# Patient Record
Sex: Female | Born: 1955
Health system: Southern US, Community
[De-identification: ages and names within clinical notes are randomized; demographics above are authoritative.]

## PROBLEM LIST (undated history)

## (undated) DIAGNOSIS — I10 Essential (primary) hypertension: Secondary | ICD-10-CM

## (undated) HISTORY — PX: CARDIAC ELECTROPHYSIOLOGY MAPPING AND ABLATION: SHX1292

## (undated) HISTORY — PX: BUNIONECTOMY: SHX129

## (undated) HISTORY — PX: CARDIAC ELECTROPHYSIOLOGY STUDY AND ABLATION: SHX1294

## (undated) HISTORY — PX: VAGINAL HYSTERECTOMY: SUR661

---

## 1999-06-03 ENCOUNTER — Encounter: Payer: Self-pay | Admitting: Emergency Medicine

## 1999-06-03 ENCOUNTER — Encounter: Admission: RE | Admit: 1999-06-03 | Discharge: 1999-06-03 | Payer: Self-pay | Admitting: Emergency Medicine

## 2003-08-02 ENCOUNTER — Emergency Department (HOSPITAL_COMMUNITY): Admission: EM | Admit: 2003-08-02 | Discharge: 2003-08-02 | Payer: Self-pay | Admitting: Emergency Medicine

## 2007-03-30 ENCOUNTER — Ambulatory Visit: Payer: Self-pay | Admitting: Internal Medicine

## 2007-05-07 ENCOUNTER — Other Ambulatory Visit: Payer: Self-pay

## 2007-05-07 ENCOUNTER — Emergency Department: Payer: Self-pay | Admitting: Emergency Medicine

## 2007-06-01 ENCOUNTER — Ambulatory Visit: Payer: Self-pay | Admitting: Emergency Medicine

## 2007-11-21 ENCOUNTER — Ambulatory Visit: Payer: Self-pay | Admitting: Family Medicine

## 2008-10-06 ENCOUNTER — Emergency Department: Payer: Self-pay | Admitting: Emergency Medicine

## 2009-03-30 HISTORY — PX: COLONOSCOPY: SHX174

## 2009-04-13 ENCOUNTER — Emergency Department: Payer: Self-pay | Admitting: Emergency Medicine

## 2009-05-09 ENCOUNTER — Ambulatory Visit: Payer: Self-pay | Admitting: Internal Medicine

## 2009-12-10 ENCOUNTER — Ambulatory Visit: Payer: Self-pay | Admitting: Family Medicine

## 2010-03-28 ENCOUNTER — Ambulatory Visit: Payer: Self-pay | Admitting: Gastroenterology

## 2010-06-28 ENCOUNTER — Ambulatory Visit: Payer: Self-pay | Admitting: Internal Medicine

## 2010-12-17 ENCOUNTER — Ambulatory Visit: Payer: Self-pay | Admitting: Family Medicine

## 2011-04-17 ENCOUNTER — Ambulatory Visit: Payer: Self-pay

## 2011-07-01 ENCOUNTER — Ambulatory Visit: Payer: Self-pay

## 2011-07-01 LAB — COMPREHENSIVE METABOLIC PANEL
Albumin: 3.2 g/dL — ABNORMAL LOW (ref 3.4–5.0)
Alkaline Phosphatase: 102 U/L (ref 50–136)
Anion Gap: 11 (ref 7–16)
BUN: 12 mg/dL (ref 7–18)
Bilirubin,Total: 0.3 mg/dL (ref 0.2–1.0)
Calcium, Total: 9.2 mg/dL (ref 8.5–10.1)
Chloride: 101 mmol/L (ref 98–107)
Co2: 29 mmol/L (ref 21–32)
Creatinine: 1.02 mg/dL (ref 0.60–1.30)
EGFR (African American): >60
EGFR (Non-African Amer.): 59 — ABNORMAL LOW
Glucose: 84 mg/dL (ref 65–99)
Osmolality: 280 (ref 275–301)
Potassium: 3.2 mmol/L — ABNORMAL LOW (ref 3.5–5.1)
SGOT(AST): 24 U/L (ref 15–37)
SGPT (ALT): 24 U/L
Sodium: 141 mmol/L (ref 136–145)
Total Protein: 7 g/dL (ref 6.4–8.2)

## 2011-07-01 LAB — LIPASE, BLOOD: Lipase: 71 U/L — ABNORMAL LOW (ref 73–393)

## 2011-07-01 LAB — CBC WITH DIFFERENTIAL/PLATELET
Bands: 9 %
Basophil: 1 %
Comment - H1-Com2: NORMAL
Eosinophil: 1 %
HCT: 41.8 % (ref 35.0–47.0)
HGB: 14 g/dL (ref 12.0–16.0)
Lymphocytes: 20 %
MCH: 31.7 pg (ref 26.0–34.0)
MCHC: 33.5 g/dL (ref 32.0–36.0)
MCV: 94 fL (ref 80–100)
Monocytes: 13 %
Platelet: 274 10*3/uL (ref 150–440)
RBC: 4.43 10*6/uL (ref 3.80–5.20)
RDW: 12.1 % (ref 11.5–14.5)
Segmented Neutrophils: 54 %
Variant Lymphocyte - H1-Rlymph: 2 %
WBC: 4.9 10*3/uL (ref 3.6–11.0)

## 2011-07-01 LAB — AMYLASE: Amylase: 17 U/L — ABNORMAL LOW (ref 25–115)

## 2011-07-01 LAB — OCCULT BLOOD X 1 CARD TO LAB, STOOL: Occult Blood, Feces: NEGATIVE

## 2011-07-01 LAB — RAPID INFLUENZA A&B ANTIGENS

## 2011-07-05 ENCOUNTER — Ambulatory Visit: Payer: Self-pay | Admitting: Internal Medicine

## 2011-07-05 LAB — URINALYSIS, COMPLETE
Glucose,UR: NEGATIVE mg/dL (ref 0–75)
Ketone: NEGATIVE
Nitrite: POSITIVE
Ph: 6 (ref 4.5–8.0)
Protein: 100
Specific Gravity: >=1.03 (ref 1.003–1.030)

## 2011-07-08 LAB — URINE CULTURE

## 2012-02-08 ENCOUNTER — Ambulatory Visit: Payer: Self-pay | Admitting: Family Medicine

## 2014-03-26 ENCOUNTER — Ambulatory Visit: Payer: Self-pay | Admitting: Physician Assistant

## 2015-01-14 ENCOUNTER — Ambulatory Visit (INDEPENDENT_AMBULATORY_CARE_PROVIDER_SITE_OTHER): Payer: 59 | Admitting: Family Medicine

## 2015-01-14 ENCOUNTER — Emergency Department: Payer: 59

## 2015-01-14 ENCOUNTER — Emergency Department
Admission: EM | Admit: 2015-01-14 | Discharge: 2015-01-14 | Disposition: A | Discharge status: Home / Self Care | Payer: 59 | Attending: Emergency Medicine | Admitting: Emergency Medicine

## 2015-01-14 ENCOUNTER — Encounter: Payer: Self-pay | Admitting: Emergency Medicine

## 2015-01-14 ENCOUNTER — Encounter: Payer: Self-pay | Admitting: Family Medicine

## 2015-01-14 VITALS — BP 146/100 | HR 70 | Ht 60.0 in | Wt 135.0 lb

## 2015-01-14 DIAGNOSIS — I10 Essential (primary) hypertension: Secondary | ICD-10-CM | POA: Diagnosis not present

## 2015-01-14 DIAGNOSIS — R208 Other disturbances of skin sensation: Secondary | ICD-10-CM | POA: Diagnosis not present

## 2015-01-14 DIAGNOSIS — R200 Anesthesia of skin: Secondary | ICD-10-CM | POA: Insufficient documentation

## 2015-01-14 DIAGNOSIS — R079 Chest pain, unspecified: Secondary | ICD-10-CM | POA: Insufficient documentation

## 2015-01-14 DIAGNOSIS — R531 Weakness: Secondary | ICD-10-CM

## 2015-01-14 DIAGNOSIS — M6289 Other specified disorders of muscle: Secondary | ICD-10-CM

## 2015-01-14 DIAGNOSIS — Z87891 Personal history of nicotine dependence: Secondary | ICD-10-CM | POA: Insufficient documentation

## 2015-01-14 HISTORY — DX: Essential (primary) hypertension: I10

## 2015-01-14 LAB — CBC
HCT: 41.4 % (ref 35.0–47.0)
HEMOGLOBIN: 13.9 g/dL (ref 12.0–16.0)
MCH: 30.9 pg (ref 26.0–34.0)
MCHC: 33.6 g/dL (ref 32.0–36.0)
MCV: 92 fL (ref 80.0–100.0)
PLATELETS: 313 10*3/uL (ref 150–440)
RBC: 4.51 MIL/uL (ref 3.80–5.20)
RDW: 12.6 % (ref 11.5–14.5)
WBC: 8 10*3/uL (ref 3.6–11.0)

## 2015-01-14 LAB — BASIC METABOLIC PANEL
ANION GAP: 6 (ref 5–15)
BUN: 16 mg/dL (ref 6–20)
CALCIUM: 9.4 mg/dL (ref 8.9–10.3)
CO2: 26 mmol/L (ref 22–32)
Chloride: 105 mmol/L (ref 101–111)
Creatinine, Ser: 0.72 mg/dL (ref 0.44–1.00)
GLUCOSE: 98 mg/dL (ref 65–99)
POTASSIUM: 4.2 mmol/L (ref 3.5–5.1)
Sodium: 137 mmol/L (ref 135–145)

## 2015-01-14 LAB — TROPONIN I: Troponin I: <0.03 ng/mL (ref <0.031)

## 2015-01-14 NOTE — Progress Notes (Signed)
Name: Kirsten Moore   MRN: [id number]    DOB: [birth year]   Date:01/14/2015       Progress Note  Subjective  Chief Complaint  Chief Complaint  Patient presents with  . Extremity Weakness    had episode of a "shock" going down arm x 2- now feels weakness in arm and L) side of face feels "different"- been off of B/P meds "a long time"    Extremity Weakness  The pain is present in the left hand, left fingers, left elbow, left arm, neck, left shoulder, left upper leg and left lower leg (leftfacial numb feeling). This is a new problem. The current episode started yesterday. There has been no history of extremity trauma. The problem occurs intermittently. The problem has been waxing and waning. Quality: "shock. Pertinent negatives include no fever, inability to bear weight, itching, joint locking, joint swelling, limited range of motion, numbness, stiffness or tingling. She has tried nothing for the symptoms. The treatment provided no relief. uncontrolled hypertension  Chest Pain  This is a new problem. The current episode started today. The onset quality is sudden. The problem occurs intermittently. The problem has been unchanged. The pain is present in the substernal region (across chest). The quality of the pain is described as tightness. Radiates to: numb left facial. Associated symptoms include a cough, palpitations and weakness. Pertinent negatives include no abdominal pain, back pain, diaphoresis, dizziness, fever, headaches, malaise/fatigue, nausea, near-syncope, numbness, shortness of breath or sputum production. Associated symptoms comments: Left extremeties. The cough is non-productive. The pain is aggravated by nothing. The treatment provided no relief. Past medical history comments: uncontrolled hypertension    No problem-specific assessment & plan notes found for this encounter.   No past medical history on file.  Past Surgical History  Procedure Laterality Date  . Vaginal  hysterectomy      partial  . Cardiac electrophysiology study and ablation    . Bunionectomy Left   . Colonoscopy  2011    cleared for 10 yrs- [facility]    No family history on file.  Social History   Social History  . Marital Status: Married    Spouse Name: N/A  . Number of Children: N/A  . Years of Education: N/A   Occupational History  . Not on file.   Social History Main Topics  . Smoking status: Former Games developermoker  . Smokeless tobacco: Not on file  . Alcohol Use: No  . Drug Use: No  . Sexual Activity: Not on file   Other Topics Concern  . Not on file   Social History Narrative  . No narrative on file    Allergies  Allergen Reactions  . Sulfa Antibiotics Nausea And Vomiting     Review of Systems  Constitutional: Negative for fever, chills, weight loss, malaise/fatigue and diaphoresis.  HENT: Negative for ear discharge, ear pain and sore throat.   Eyes: Negative for blurred vision.  Respiratory: Positive for cough. Negative for sputum production, shortness of breath and wheezing.   Cardiovascular: Positive for chest pain and palpitations. Negative for leg swelling and near-syncope.  Gastrointestinal: Negative for heartburn, nausea, abdominal pain, diarrhea, constipation, blood in stool and melena.  Genitourinary: Negative for dysuria, urgency, frequency and hematuria.  Musculoskeletal: Positive for extremity weakness. Negative for myalgias, back pain, joint pain, stiffness and neck pain.  Skin: Negative for itching and rash.  Neurological: Positive for weakness. Negative for dizziness, tingling, sensory change, focal weakness, numbness and headaches.  Endo/Heme/Allergies: Negative for  environmental allergies and polydipsia. Does not bruise/bleed easily.  Psychiatric/Behavioral: Negative for depression and suicidal ideas. The patient is not nervous/anxious and does not have insomnia.      Objective  Filed Vitals:   01/14/15 0922  BP: 146/100  Pulse: 70   Height: 5' (1.524 m)  Weight: 135 lb (61.236 kg)    Physical Exam  Constitutional: She is oriented to person, place, and time and well-developed, well-nourished, and in no distress. No distress.  HENT:  Head: Normocephalic and atraumatic.  Right Ear: External ear normal.  Left Ear: External ear normal.  Nose: Nose normal.  Mouth/Throat: Oropharynx is clear and moist.  Eyes: Conjunctivae and EOM are normal. Pupils are equal, round, and reactive to light. Right eye exhibits no discharge. Left eye exhibits no discharge.  Neck: Normal range of motion. Neck supple. No JVD present. No thyromegaly present.  Cardiovascular: Normal rate, regular rhythm, normal heart sounds and intact distal pulses.  Exam reveals no gallop and no friction rub.   No murmur heard. Pulmonary/Chest: Effort normal and breath sounds normal.  Abdominal: Soft. Bowel sounds are normal. She exhibits no mass. There is no tenderness. There is no guarding.  Musculoskeletal: Normal range of motion. She exhibits no edema.  Lymphadenopathy:    She has no cervical adenopathy.  Neurological: She is alert and oriented to person, place, and time. She has normal motor skills, normal sensation, normal strength, normal reflexes and intact cranial nerves. Gait normal.  Skin: Skin is warm and dry. She is not diaphoretic.  Psychiatric: Affect normal. Her mood appears anxious.      Assessment & Plan  Problem List Items Addressed This Visit    None    Visit Diagnoses    Chest pain, unspecified chest pain type    -  Primary    referral er    Relevant Orders    EKG 12-Lead (Completed)    Essential hypertension        Weakness of one side of body        Left facial numbness             Dr. Elizabeth Sauer [facility]  01/14/2015

## 2015-01-14 NOTE — ED Provider Notes (Signed)
[facility] Emergency Department Provider Note   ____________________________________________  Time seen: 1400  I have reviewed the triage vital signs and the nursing notes.   HISTORY  Chief Complaint Weakness   History limited by: Not Limited   HPI Kirsten Moore is a 59 y.o. female who presents to the emergency department today because of concern for left arm pain, left sided altered sensation and a brief episode of chest pain. The patient states that this all started yesterday when she was opening a door. She had a shock of pain go through her left arm. It lasted roughly one minute. This shock sensation then happened again this morning around 5am. The patient states since that time she has had an odd sensation to her left face, left arm and left leg. She says she can feel things, its just that they are not her normal sensation. In addition to this she also complains of intermittent chest tightness. She denies any trauma to her arm.    Past Medical History  Diagnosis Date  . Hypertension     There are no active problems to display for this patient.   Past Surgical History  Procedure Laterality Date  . Vaginal hysterectomy      partial  . Cardiac electrophysiology study and ablation    . Bunionectomy Left   . Colonoscopy  2011    cleared for 10 yrs- [facility]    No current outpatient prescriptions on file.  Allergies Sulfa antibiotics  No family history on file.  Social History Social History  Substance Use Topics  . Smoking status: Former Games developer  . Smokeless tobacco: None  . Alcohol Use: No    Review of Systems  Constitutional: Negative for fever. Cardiovascular: Positive for chest pain. Respiratory: Negative for shortness of breath. Gastrointestinal: Negative for abdominal pain, vomiting and diarrhea. Genitourinary: Negative for dysuria. Musculoskeletal: Negative for back pain. Skin: Negative for rash. Neurological: Negative  for headaches, focal weakness or numbness.   10-point ROS otherwise negative.  ____________________________________________   PHYSICAL EXAM:  VITAL SIGNS: ED Triage Vitals  Enc Vitals Group     BP 01/14/15 1125 120/75 mmHg     Pulse Rate 01/14/15 1125 75     Resp 01/14/15 1125 16     Temp 01/14/15 1125 98.2 F (36.8 C)     Temp Source 01/14/15 1125 Oral     SpO2 01/14/15 1125 96 %     Weight 01/14/15 1125 136 lb (61.689 kg)     Height 01/14/15 1125 5' (1.524 m)   Constitutional: Alert and oriented. Well appearing and in no distress. Eyes: Conjunctivae are normal. PERRL. Normal extraocular movements. ENT   Head: Normocephalic and atraumatic.   Nose: No congestion/rhinnorhea.   Mouth/Throat: Mucous membranes are moist.   Neck: No stridor. Hematological/Lymphatic/Immunilogical: No cervical lymphadenopathy. Cardiovascular: Normal rate, regular rhythm.  No murmurs, rubs, or gallops. Respiratory: Normal respiratory effort without tachypnea nor retractions. Breath sounds are clear and equal bilaterally. No wheezes/rales/rhonchi. Gastrointestinal: Soft and nontender. No distention.  Genitourinary: Deferred Musculoskeletal: Normal range of motion in all extremities. No joint effusions.  No lower extremity tenderness nor edema. Neurologic:  Normal speech and language. No facial assymetry. Tongue midline. Facial sensation intact. Strength 5/5 in both upper and lower extremities. No drift. Sensation intact over extremities. No gross focal neurologic deficits are appreciated. Speech is normal.  Skin:  Skin is warm, dry and intact. No rash noted. Psychiatric: Mood and affect are normal. Speech and behavior  are normal. Patient exhibits appropriate insight and judgment.  ____________________________________________    LABS (pertinent positives/negatives)  Labs Reviewed  BASIC METABOLIC PANEL  TROPONIN I  CBC      ____________________________________________   EKG  I, Phineas SemenGraydon Damon Hargrove, attending physician, personally viewed and interpreted this EKG  EKG Time: 1146 Rate: 69 Rhythm: normal sinus rhythm with PAC Axis: normal Intervals: qtc 428 QRS: narrow ST changes: no st elevation Impression: normal ekg ____________________________________________    RADIOLOGY  CXR IMPRESSION: No active cardiopulmonary disease.  I, Edda Orea, personally viewed and evaluated these images (plain radiographs) as part of my medical decision making.  CT Head  IMPRESSION: Negative head CT.  MRI brain IMPRESSION: Minor white matter disease. Mild prominence perivascular spaces likely sequelae of chronic hypertension.  No acute intracranial findings. ____________________________________________   PROCEDURES  Procedure(s) performed: None  Critical Care performed: No  ____________________________________________   INITIAL IMPRESSION / ASSESSMENT AND PLAN / ED COURSE  Pertinent labs & imaging results that were available during my care of the patient were reviewed by me and considered in my medical decision making (see chart for details).  Patient presented to the emergency department today because of concerns for left arm pain, left sided weakness and numbness. With a CT head and MRI were performed. Neither of these showed any signs concerning for an acute stroke. Given this I do think stroke is unlikely. I wonder if patient does have some cervical radiculopathy that could be explaining some of the arm symptoms. I will give patient neurology follow-up.  ____________________________________________   FINAL CLINICAL IMPRESSION(S) / ED DIAGNOSES  Final diagnoses:  Chest pain, unspecified chest pain type  Left sided numbness     Phineas SemenGraydon Kaya Klausing, MD 01/14/15 1806

## 2015-01-14 NOTE — ED Notes (Signed)
To MRI

## 2015-01-14 NOTE — Discharge Instructions (Signed)
Please seek medical attention for any high fevers, chest pain, shortness of breath, change in behavior, persistent vomiting, bloody stool or any other new or concerning symptoms. ° ° °Nonspecific Chest Pain  °Chest pain can be caused by many different conditions. There is always a chance that your pain could be related to something serious, such as a heart attack or a blood clot in your lungs. Chest pain can also be caused by conditions that are not life-threatening. If you have chest pain, it is very important to follow up with your health care provider. °CAUSES  °Chest pain can be caused by: °· Heartburn. °· Pneumonia or bronchitis. °· Anxiety or stress. °· Inflammation around your heart (pericarditis) or lung (pleuritis or pleurisy). °· A blood clot in your lung. °· A collapsed lung (pneumothorax). It can develop suddenly on its own (spontaneous pneumothorax) or from trauma to the chest. °· Shingles infection (varicella-zoster virus). °· Heart attack. °· Damage to the bones, muscles, and cartilage that make up your chest wall. This can include: °¨ Bruised bones due to injury. °¨ Strained muscles or cartilage due to frequent or repeated coughing or overwork. °¨ Fracture to one or more ribs. °¨ Sore cartilage due to inflammation (costochondritis). °RISK FACTORS  °Risk factors for chest pain may include: °· Activities that increase your risk for trauma or injury to your chest. °· Respiratory infections or conditions that cause frequent coughing. °· Medical conditions or overeating that can cause heartburn. °· Heart disease or family history of heart disease. °· Conditions or health behaviors that increase your risk of developing a blood clot. °· Having had chicken pox (varicella zoster). °SIGNS AND SYMPTOMS °Chest pain can feel like: °· Burning or tingling on the surface of your chest or deep in your chest. °· Crushing, pressure, aching, or squeezing pain. °· Dull or sharp pain that is worse when you move, cough, or  take a deep breath. °· Pain that is also felt in your back, neck, shoulder, or arm, or pain that spreads to any of these areas. °Your chest pain may come and go, or it may stay constant. °DIAGNOSIS °Lab tests or other studies may be needed to find the cause of your pain. Your health care provider may have you take a test called an ambulatory ECG (electrocardiogram). An ECG records your heartbeat patterns at the time the test is performed. You may also have other tests, such as: °· Transthoracic echocardiogram (TTE). During echocardiography, sound waves are used to create a picture of all of the heart structures and to look at how blood flows through your heart. °· Transesophageal echocardiogram (TEE). This is a more advanced imaging test that obtains images from inside your body. It allows your health care provider to see your heart in finer detail. °· Cardiac monitoring. This allows your health care provider to monitor your heart rate and rhythm in real time. °· Holter monitor. This is a portable device that records your heartbeat and can help to diagnose abnormal heartbeats. It allows your health care provider to track your heart activity for several days, if needed. °· Stress tests. These can be done through exercise or by taking medicine that makes your heart beat more quickly. °· Blood tests. °· Imaging tests. °TREATMENT  °Your treatment depends on what is causing your chest pain. Treatment may include: °· Medicines. These may include: °¨ Acid blockers for heartburn. °¨ Anti-inflammatory medicine. °¨ Pain medicine for inflammatory conditions. °¨ Antibiotic medicine, if an infection is present. °¨ Medicines   to dissolve blood clots. °¨ Medicines to treat coronary artery disease. °· Supportive care for conditions that do not require medicines. This may include: °¨ Resting. °¨ Applying heat or cold packs to injured areas. °¨ Limiting activities until pain decreases. °HOME CARE INSTRUCTIONS °· If you were prescribed  an antibiotic medicine, finish it all even if you start to feel better. °· Avoid any activities that bring on chest pain. °· Do not use any tobacco products, including cigarettes, chewing tobacco, or electronic cigarettes. If you need help quitting, ask your health care provider. °· Do not drink alcohol. °· Take medicines only as directed by your health care provider. °· Keep all follow-up visits as directed by your health care provider. This is important. This includes any further testing if your chest pain does not go away. °· If heartburn is the cause for your chest pain, you may be told to keep your head raised (elevated) while sleeping. This reduces the chance that acid will go from your stomach into your esophagus. °· Make lifestyle changes as directed by your health care provider. These may include: °¨ Getting regular exercise. Ask your health care provider to suggest some activities that are safe for you. °¨ Eating a heart-healthy diet. A registered dietitian can help you to learn healthy eating options. °¨ Maintaining a healthy weight. °¨ Managing diabetes, if necessary. °¨ Reducing stress. °SEEK MEDICAL CARE IF: °· Your chest pain does not go away after treatment. °· You have a rash with blisters on your chest. °· You have a fever. °SEEK IMMEDIATE MEDICAL CARE IF:  °· Your chest pain is worse. °· You have an increasing cough, or you cough up blood. °· You have severe abdominal pain. °· You have severe weakness. °· You faint. °· You have chills. °· You have sudden, unexplained chest discomfort. °· You have sudden, unexplained discomfort in your arms, back, neck, or jaw. °· You have shortness of breath at any time. °· You suddenly start to sweat, or your skin gets clammy. °· You feel nauseous or you vomit. °· You suddenly feel light-headed or dizzy. °· Your heart begins to beat quickly, or it feels like it is skipping beats. °These symptoms may represent a serious problem that is an emergency. Do not wait to  see if the symptoms will go away. Get medical help right away. Call your local emergency services (911 in the [place] Do not drive yourself to the hospital. °  °This information is not intended to replace advice given to you by your health care provider. Make sure you discuss any questions you have with your health care provider. °  °Document Released: 12/24/2004 Document Revised: 04/06/2014 Document Reviewed: 10/20/2013 °Elsevier Interactive Patient Education ©2016 [employer] ° °

## 2015-01-14 NOTE — ED Notes (Signed)
[facility] of sx of left arm weakness and face feeling different for two days and now having chest pain this am. Pt has not had her bp meds in a long time.

## 2015-01-15 MED ORDER — DILTIAZEM HCL ER COATED BEADS 120 MG PO CP24: 120.0000 mg | ORAL_CAPSULE | Freq: Every day | ORAL | Status: DC

## 2015-01-15 NOTE — Addendum Note (Signed)
Addended by: Arthur HolmsLYNCH, Uno Esau L on: 01/15/2015 11:07 AM   Modules accepted: Orders

## 2015-01-22 ENCOUNTER — Ambulatory Visit (INDEPENDENT_AMBULATORY_CARE_PROVIDER_SITE_OTHER): Payer: 59 | Admitting: Family Medicine

## 2015-01-22 ENCOUNTER — Encounter: Payer: Self-pay | Admitting: Family Medicine

## 2015-01-22 VITALS — BP 112/72 | HR 76 | Ht 60.0 in | Wt 137.0 lb

## 2015-01-22 DIAGNOSIS — M810 Age-related osteoporosis without current pathological fracture: Secondary | ICD-10-CM | POA: Diagnosis not present

## 2015-01-22 DIAGNOSIS — I10 Essential (primary) hypertension: Secondary | ICD-10-CM | POA: Diagnosis not present

## 2015-01-22 DIAGNOSIS — Z09 Encounter for follow-up examination after completed treatment for conditions other than malignant neoplasm: Secondary | ICD-10-CM | POA: Diagnosis not present

## 2015-01-22 DIAGNOSIS — J40 Bronchitis, not specified as acute or chronic: Secondary | ICD-10-CM

## 2015-01-22 MED ORDER — AMOXICILLIN 250 MG/5ML PO SUSR: 500.0000 mg | Freq: Two times a day (BID) | ORAL | Status: DC

## 2015-01-22 MED ORDER — DILTIAZEM HCL ER COATED BEADS 120 MG PO CP24: 120.0000 mg | ORAL_CAPSULE | Freq: Every day | ORAL | Status: DC

## 2015-01-22 NOTE — Progress Notes (Signed)
Name: Kirsten Moore   MRN: [id number]    DOB: 01/01/1956   Date:01/22/2015       Progress Note  Subjective  Chief Complaint  Chief Complaint  Patient presents with  . Hypertension    started Diltiazem 120mg  qday  . Osteoporosis    ? starting Fosamax    HPI Comments: Transition from er for elevated bp,leftparethesias, and chest pain.  Hypertension This is a new problem. The current episode started 1 to 4 weeks ago. The problem has been gradually improving since onset. The problem is controlled. Pertinent negatives include no anxiety, blurred vision, chest pain, headaches, malaise/fatigue, neck pain, orthopnea, palpitations, peripheral edema, PND, shortness of breath or sweats. There are no associated agents to hypertension. There are no known risk factors for coronary artery disease. Past treatments include calcium channel blockers. The current treatment provides moderate improvement. There are no compliance problems.  There is no history of angina, kidney disease, CAD/MI, CVA, heart failure, left ventricular hypertrophy, PVD, renovascular disease or retinopathy. There is no history of chronic renal disease.  Cough This is a recurrent problem. The current episode started 1 to 4 weeks ago. The problem has been waxing and waning. The cough is non-productive. Pertinent negatives include no chest pain, chills, ear pain, fever, headaches, heartburn, myalgias, rash, sore throat, shortness of breath, sweats, weight loss or wheezing. She has tried nothing for the symptoms. There is no history of asthma, bronchiectasis, bronchitis, COPD, emphysema or environmental allergies.    No problem-specific assessment & plan notes found for this encounter.   Past Medical History  Diagnosis Date  . Hypertension     Past Surgical History  Procedure Laterality Date  . Vaginal hysterectomy      partial  . Cardiac electrophysiology study and ablation    . Bunionectomy Left   . Colonoscopy  2011   [facility]    History reviewed. No pertinent family history.  Social History   Social History  . Marital Status: Married    Spouse Name: N/A  . Number of Children: N/A  . Years of Education: N/A   Occupational History  . Not on file.   Social History Main Topics  . Smoking status: Former Games developermoker  . Smokeless tobacco: Not on file  . Alcohol Use: No  . Drug Use: No  . Sexual Activity: Not Currently   Other Topics Concern  . Not on file   Social History Narrative    Allergies  Allergen Reactions  . Sulfa Antibiotics Nausea And Vomiting     Review of Systems  Constitutional: Negative for fever, chills, weight loss and malaise/fatigue.  HENT: Negative for ear discharge, ear pain and sore throat.   Eyes: Negative for blurred vision.  Respiratory: Negative for cough, sputum production, shortness of breath and wheezing.   Cardiovascular: Negative for chest pain, palpitations, orthopnea, leg swelling and PND.  Gastrointestinal: Negative for heartburn, nausea, abdominal pain, diarrhea, constipation, blood in stool and melena.  Genitourinary: Negative for dysuria, urgency, frequency and hematuria.  Musculoskeletal: Negative for myalgias, back pain, joint pain and neck pain.  Skin: Negative for rash.  Neurological: Negative for dizziness, tingling, sensory change, focal weakness and headaches.  Endo/Heme/Allergies: Negative for environmental allergies and polydipsia. Does not bruise/bleed easily.  Psychiatric/Behavioral: Negative for depression and suicidal ideas. The patient is not nervous/anxious and does not have insomnia.      Objective  Filed Vitals:   01/22/15 0955  BP: 112/72  Pulse: 76  Height: 5' (1.524 m)  Weight: 137 lb (62.143 kg)    Physical Exam  Constitutional: She is well-developed, well-nourished, and in no distress. No distress.  HENT:  Head: Normocephalic and atraumatic.  Right Ear: External ear normal.  Left Ear: External ear  normal.  Nose: Nose normal.  Mouth/Throat: Oropharynx is clear and moist.  Eyes: Conjunctivae and EOM are normal. Pupils are equal, round, and reactive to light. Right eye exhibits no discharge. Left eye exhibits no discharge.  Neck: Normal range of motion. Neck supple. No JVD present. No thyromegaly present.  Cardiovascular: Normal rate, regular rhythm, normal heart sounds and intact distal pulses.  Exam reveals no gallop and no friction rub.   No murmur heard. Pulmonary/Chest: Effort normal and breath sounds normal.  Abdominal: Soft. Bowel sounds are normal. She exhibits no mass. There is no tenderness. There is no guarding.  Musculoskeletal: Normal range of motion. She exhibits no edema.  Lymphadenopathy:    She has no cervical adenopathy.  Neurological: She is alert.  Skin: Skin is warm and dry. She is not diaphoretic.  Psychiatric: Mood and affect normal.      Assessment & Plan  Problem List Items Addressed This Visit      Cardiovascular and Mediastinum   Essential hypertension   Relevant Medications   diltiazem (CARDIZEM CD) 120 MG 24 hr capsule   amoxicillin (AMOXIL) 250 MG/5ML suspension     Musculoskeletal and Integument   Osteoporosis    Other Visit Diagnoses    Hospital discharge follow-up    -  Primary    Bronchitis             Dr. Elizabeth Sauer [facility]  01/22/2015

## 2015-01-23 ENCOUNTER — Ambulatory Visit: Payer: 59 | Admitting: Family Medicine

## 2015-01-31 ENCOUNTER — Other Ambulatory Visit: Payer: Self-pay

## 2015-01-31 DIAGNOSIS — R059 Cough, unspecified: Secondary | ICD-10-CM

## 2015-01-31 DIAGNOSIS — R05 Cough: Secondary | ICD-10-CM

## 2015-01-31 MED ORDER — BENZONATATE 100 MG PO CAPS: 100.0000 mg | ORAL_CAPSULE | Freq: Two times a day (BID) | ORAL | Status: DC | PRN

## 2015-02-06 ENCOUNTER — Ambulatory Visit (INDEPENDENT_AMBULATORY_CARE_PROVIDER_SITE_OTHER): Payer: 59 | Admitting: Family Medicine

## 2015-02-06 ENCOUNTER — Encounter: Payer: Self-pay | Admitting: Family Medicine

## 2015-02-06 VITALS — BP 120/90 | HR 70 | Ht 60.0 in | Wt 136.0 lb

## 2015-02-06 DIAGNOSIS — N3090 Cystitis, unspecified without hematuria: Secondary | ICD-10-CM | POA: Diagnosis not present

## 2015-02-06 LAB — POCT URINALYSIS DIPSTICK
Bilirubin, UA: NEGATIVE
Glucose, UA: NEGATIVE
KETONES UA: NEGATIVE
PH UA: 5
PROTEIN UA: NEGATIVE
Spec Grav, UA: 1.01
Urobilinogen, UA: NEGATIVE

## 2015-02-06 MED ORDER — CIPROFLOXACIN HCL 250 MG PO TABS: 250.0000 mg | ORAL_TABLET | Freq: Two times a day (BID) | ORAL | Status: DC

## 2015-02-06 NOTE — Progress Notes (Signed)
Name: Kirsten Moore   MRN: [id number]    DOB: 1955-06-09   Date:02/06/2015       Progress Note  Subjective  Chief Complaint  Chief Complaint  Patient presents with  . Urinary Tract Infection    urgency and discomfort in lower abdomen- pain at the end of stream    Urinary Tract Infection  This is a new problem. The current episode started in the past 7 days. The problem occurs every urination. The problem has been gradually worsening. The quality of the pain is described as burning (at end of urination). The pain is at a severity of 2/10. The pain is mild (suprapubic ). There has been no fever. There is no history of pyelonephritis. Associated symptoms include frequency and urgency. Pertinent negatives include no chills, discharge, flank pain, hematuria, hesitancy, nausea, possible pregnancy or vomiting. She has tried nothing for the symptoms. The treatment provided mild relief. There is no history of catheterization, kidney stones, recurrent UTIs, a single kidney, urinary stasis or a urological procedure.    No problem-specific assessment & plan notes found for this encounter.   Past Medical History  Diagnosis Date  . Hypertension     Past Surgical History  Procedure Laterality Date  . Vaginal hysterectomy      partial  . Cardiac electrophysiology study and ablation    . Bunionectomy Left   . Colonoscopy  2011    [facility]    History reviewed. No pertinent family history.  Social History   Social History  . Marital Status: Married    Spouse Name: N/A  . Number of Children: N/A  . Years of Education: N/A   Occupational History  . Not on file.   Social History Main Topics  . Smoking status: Former Games developermoker  . Smokeless tobacco: Not on file  . Alcohol Use: No  . Drug Use: No  . Sexual Activity: Not Currently   Other Topics Concern  . Not on file   Social History Narrative    Allergies  Allergen Reactions  . Sulfa Antibiotics Nausea And  Vomiting     Review of Systems  Constitutional: Negative for fever, chills, weight loss and malaise/fatigue.  HENT: Negative for ear discharge, ear pain and sore throat.   Eyes: Negative for blurred vision.  Respiratory: Negative for cough, sputum production, shortness of breath and wheezing.   Cardiovascular: Negative for chest pain, palpitations and leg swelling.  Gastrointestinal: Negative for heartburn, nausea, vomiting, abdominal pain, diarrhea, constipation, blood in stool and melena.  Genitourinary: Positive for urgency and frequency. Negative for dysuria, hesitancy, hematuria and flank pain.  Musculoskeletal: Negative for myalgias, back pain, joint pain and neck pain.  Skin: Negative for rash.  Neurological: Negative for dizziness, tingling, sensory change, focal weakness and headaches.  Endo/Heme/Allergies: Negative for environmental allergies and polydipsia. Does not bruise/bleed easily.  Psychiatric/Behavioral: Negative for depression and suicidal ideas. The patient is not nervous/anxious and does not have insomnia.      Objective  Filed Vitals:   02/06/15 0942  BP: 120/90  Pulse: 70  Height: 5' (1.524 m)  Weight: 136 lb (61.689 kg)    Physical Exam  Constitutional: She is well-developed, well-nourished, and in no distress. No distress.  HENT:  Head: Normocephalic and atraumatic.  Right Ear: External ear normal.  Left Ear: External ear normal.  Nose: Nose normal.  Mouth/Throat: Oropharynx is clear and moist.  Eyes: Conjunctivae and EOM are normal. Pupils are equal, round,  and reactive to light. Right eye exhibits no discharge. Left eye exhibits no discharge.  Neck: Normal range of motion. Neck supple. No JVD present. No thyromegaly present.  Cardiovascular: Normal rate, regular rhythm, normal heart sounds and intact distal pulses.  Exam reveals no gallop and no friction rub.   No murmur heard. Pulmonary/Chest: Effort normal and breath sounds normal.  Abdominal:  Soft. Bowel sounds are normal. She exhibits no mass. There is no tenderness. There is no guarding.  Musculoskeletal: Normal range of motion. She exhibits no edema.  Lymphadenopathy:    She has no cervical adenopathy.  Neurological: She is alert. She has normal reflexes.  Skin: Skin is warm and dry. She is not diaphoretic.  Psychiatric: Mood and affect normal.      Assessment & Plan  Problem List Items Addressed This Visit    None    Visit Diagnoses    Cystitis    -  Primary    Relevant Medications    ciprofloxacin (CIPRO) 250 MG tablet    Other Relevant Orders    POCT Urinalysis Dipstick (Completed)         Dr. Hayden [facility]  02/06/2015

## 2015-02-18 ENCOUNTER — Other Ambulatory Visit: Payer: Self-pay | Admitting: Family Medicine

## 2015-02-25 ENCOUNTER — Other Ambulatory Visit: Payer: Self-pay

## 2015-03-04 ENCOUNTER — Encounter: Payer: 59 | Admitting: Family Medicine

## 2015-03-12 ENCOUNTER — Encounter: Payer: Self-pay | Admitting: Family Medicine

## 2015-03-12 ENCOUNTER — Ambulatory Visit (INDEPENDENT_AMBULATORY_CARE_PROVIDER_SITE_OTHER): Payer: 59 | Admitting: Family Medicine

## 2015-03-12 VITALS — BP 120/70 | HR 64 | Ht 60.0 in | Wt 135.0 lb

## 2015-03-12 DIAGNOSIS — M810 Age-related osteoporosis without current pathological fracture: Secondary | ICD-10-CM | POA: Diagnosis not present

## 2015-03-12 DIAGNOSIS — Z1211 Encounter for screening for malignant neoplasm of colon: Secondary | ICD-10-CM

## 2015-03-12 DIAGNOSIS — Z1239 Encounter for other screening for malignant neoplasm of breast: Secondary | ICD-10-CM | POA: Diagnosis not present

## 2015-03-12 DIAGNOSIS — Z1231 Encounter for screening mammogram for malignant neoplasm of breast: Secondary | ICD-10-CM | POA: Diagnosis not present

## 2015-03-12 DIAGNOSIS — Z124 Encounter for screening for malignant neoplasm of cervix: Secondary | ICD-10-CM

## 2015-03-12 DIAGNOSIS — Z23 Encounter for immunization: Secondary | ICD-10-CM

## 2015-03-12 DIAGNOSIS — Z0000 Encounter for general adult medical examination without abnormal findings: Secondary | ICD-10-CM

## 2015-03-12 LAB — HEMOCCULT GUIAC POC 1CARD (OFFICE)
FECAL OCCULT BLD: NEGATIVE
Fecal Occult Blood, POC: NEGATIVE

## 2015-03-12 NOTE — Progress Notes (Signed)
Name: Kirsten Moore   MRN: [id number]    DOB: 31-Jan-1956   Date:03/12/2015       Progress Note  Subjective  Chief Complaint  Chief Complaint  Patient presents with  . Annual Exam    pap and pelvic- no problems    HPI Comments: Patient presents for annual physical exam.   No problem-specific assessment & plan notes found for this encounter.   Past Medical History  Diagnosis Date  . Hypertension     Past Surgical History  Procedure Laterality Date  . Vaginal hysterectomy      partial  . Cardiac electrophysiology study and ablation    . Bunionectomy Left   . Colonoscopy  2011    cleared for 10 yrs- [facility]    History reviewed. No pertinent family history.  Social History   Social History  . Marital Status: Married    Spouse Name: N/A  . Number of Children: N/A  . Years of Education: N/A   Occupational History  . Not on file.   Social History Main Topics  . Smoking status: Former Games developer  . Smokeless tobacco: Not on file  . Alcohol Use: No  . Drug Use: No  . Sexual Activity: Not Currently   Other Topics Concern  . Not on file   Social History Narrative    Allergies  Allergen Reactions  . Sulfa Antibiotics Nausea And Vomiting     Review of Systems  Constitutional: Negative for fever, chills, weight loss and malaise/fatigue.  HENT: Negative for ear discharge, ear pain and sore throat.   Eyes: Negative for blurred vision.  Respiratory: Negative for cough, sputum production, shortness of breath and wheezing.   Cardiovascular: Negative for chest pain, palpitations and leg swelling.  Gastrointestinal: Negative for heartburn, nausea, abdominal pain, diarrhea, constipation, blood in stool and melena.  Genitourinary: Negative for dysuria, urgency, frequency and hematuria.  Musculoskeletal: Negative for myalgias, back pain, joint pain and neck pain.  Skin: Negative for rash.  Neurological: Negative for dizziness, tingling, sensory change, focal  weakness and headaches.  Endo/Heme/Allergies: Negative for environmental allergies and polydipsia. Does not bruise/bleed easily.  Psychiatric/Behavioral: Negative for depression and suicidal ideas. The patient is not nervous/anxious and does not have insomnia.      Objective  Filed Vitals:   03/12/15 0846  BP: 120/70  Pulse: 64  Height: 5' (1.524 m)  Weight: 135 lb (61.236 kg)    Physical Exam  Constitutional: She is well-developed, well-nourished, and in no distress. No distress.  HENT:  Head: Normocephalic and atraumatic.  Right Ear: External ear normal.  Left Ear: External ear normal.  Nose: Nose normal.  Mouth/Throat: Oropharynx is clear and moist.  Eyes: Conjunctivae and EOM are normal. Pupils are equal, round, and reactive to light. Right eye exhibits no discharge. Left eye exhibits no discharge.  Neck: Normal range of motion. Neck supple. No JVD present. No thyromegaly present.  Cardiovascular: Normal rate, regular rhythm, normal heart sounds and intact distal pulses.  Exam reveals no gallop and no friction rub.   No murmur heard. Pulmonary/Chest: Effort normal and breath sounds normal. Right breast exhibits no inverted nipple, no mass, no nipple discharge, no skin change and no tenderness. Left breast exhibits no inverted nipple, no mass, no nipple discharge, no skin change and no tenderness. Breasts are symmetrical.  Abdominal: Soft. Bowel sounds are normal. She exhibits no mass. There is no tenderness. There is no guarding.  Genitourinary: Vagina normal, right adnexa normal and left  adnexa normal. Guaiac negative stool. No vaginal discharge found.  Musculoskeletal: Normal range of motion. She exhibits no edema.  Lymphadenopathy:    She has no cervical adenopathy.  Neurological: She is alert. She has normal reflexes.  Skin: Skin is warm and dry. She is not diaphoretic.  Psychiatric: Mood and affect normal.  Nursing note and vitals reviewed.     Assessment &  Plan  Problem List Items Addressed This Visit      Musculoskeletal and Integument   Osteoporosis   Relevant Orders   DG Bone Density    Other Visit Diagnoses    Annual physical exam    -  Primary    Relevant Orders    Renal Function Panel    Lipid Profile    POCT Occult Blood Stool    Pap IG and HPV (high risk) DNA detection    Breast cancer screening        Relevant Orders    MM Digital Screening    Colon cancer screening        Relevant Orders    POCT Occult Blood Stool (Completed)    POCT Occult Blood Stool    Cervical cancer screening        Relevant Orders    Cytology - PAP    Visit for screening mammogram        Relevant Orders    MM Digital Screening    Flu vaccine need        Relevant Orders    Flu Vaccine QUAD 36+ mos PF IM (Fluarix & Fluzone Quad PF) (Completed)    Need for Tdap vaccination        Relevant Orders    Tdap vaccine greater than or equal to 7yo IM (Completed)         Dr. Hayden Rasmusseneanna Jones [facility]  03/12/2015

## 2015-03-13 LAB — RENAL FUNCTION PANEL
ALBUMIN: 4.2 g/dL (ref 3.6–4.8)
BUN/Creatinine Ratio: 19 (ref 11–26)
BUN: 12 mg/dL (ref 8–27)
CALCIUM: 9.2 mg/dL (ref 8.7–10.3)
CO2: 24 mmol/L (ref 18–29)
CREATININE: 0.64 mg/dL (ref 0.57–1.00)
Chloride: 102 mmol/L (ref 96–106)
GFR calc Af Amer: 111 mL/min/{1.73_m2} (ref >59)
GFR, EST NON AFRICAN AMERICAN: 97 mL/min/{1.73_m2} (ref >59)
Glucose: 77 mg/dL (ref 65–99)
PHOSPHORUS: 3.5 mg/dL (ref 2.5–4.5)
Potassium: 4.3 mmol/L (ref 3.5–5.2)
SODIUM: 141 mmol/L (ref 134–144)

## 2015-03-13 LAB — LIPID PANEL
Chol/HDL Ratio: 3.3 ratio units (ref 0.0–4.4)
Cholesterol, Total: 264 mg/dL — ABNORMAL HIGH (ref 100–199)
HDL: 80 mg/dL (ref >39)
LDL Calculated: 159 mg/dL — ABNORMAL HIGH (ref 0–99)
Triglycerides: 127 mg/dL (ref 0–149)
VLDL CHOLESTEROL CAL: 25 mg/dL (ref 5–40)

## 2015-03-20 ENCOUNTER — Other Ambulatory Visit: Payer: Self-pay

## 2015-03-21 LAB — PAP IG AND HPV HIGH-RISK
HPV, LOW VOL REFLEX: NEGATIVE
PAP Smear Comment: 0

## 2015-03-26 ENCOUNTER — Ambulatory Visit
Admission: RE | Admit: 2015-03-26 | Discharge: 2015-03-26 | Disposition: A | Discharge status: Home / Self Care | Payer: 59 | Source: Ambulatory Visit | Attending: Family Medicine | Admitting: Family Medicine

## 2015-03-26 DIAGNOSIS — Z1231 Encounter for screening mammogram for malignant neoplasm of breast: Secondary | ICD-10-CM | POA: Diagnosis not present

## 2015-03-26 DIAGNOSIS — M810 Age-related osteoporosis without current pathological fracture: Secondary | ICD-10-CM

## 2015-03-26 DIAGNOSIS — Z1239 Encounter for other screening for malignant neoplasm of breast: Secondary | ICD-10-CM

## 2015-09-03 ENCOUNTER — Encounter: Payer: Self-pay | Admitting: Family Medicine

## 2015-09-03 ENCOUNTER — Ambulatory Visit (INDEPENDENT_AMBULATORY_CARE_PROVIDER_SITE_OTHER): Payer: BLUE CROSS/BLUE SHIELD | Admitting: Family Medicine

## 2015-09-03 VITALS — BP 120/70 | HR 68 | Ht 60.0 in | Wt 146.0 lb

## 2015-09-03 DIAGNOSIS — I471 Supraventricular tachycardia: Secondary | ICD-10-CM | POA: Diagnosis not present

## 2015-09-03 DIAGNOSIS — I4719 Other supraventricular tachycardia: Secondary | ICD-10-CM | POA: Insufficient documentation

## 2015-09-03 NOTE — Progress Notes (Signed)
Name: Kirsten Moore   MRN: [id number]    DOB: 08-Jun-1955   Date:09/03/2015       Progress Note  Subjective  Chief Complaint  Chief Complaint  Patient presents with  . Hypertension    Hypertension This is a recurrent problem. The current episode started more than 1 year ago. The problem has been gradually improving since onset. The problem is controlled. Associated symptoms include palpitations. Pertinent negatives include no anxiety, blurred vision, chest pain, headaches, malaise/fatigue, neck pain, orthopnea, peripheral edema, PND, shortness of breath or sweats. There are no associated agents to hypertension. There are no known risk factors for coronary artery disease. Past treatments include calcium channel blockers (history svt). The current treatment provides mild improvement. There are no compliance problems.  There is no history of angina, kidney disease, CAD/MI, CVA, heart failure, left ventricular hypertrophy, PVD, renovascular disease or retinopathy. There is no history of chronic renal disease or a hypertension causing med.  Palpitations  This is a recurrent problem. The current episode started more than 1 year ago. The problem occurs rarely. The problem has been gradually improving. Nothing aggravates the symptoms. Pertinent negatives include no anxiety, chest fullness, chest pain, coughing, dizziness, fever, irregular heartbeat, malaise/fatigue, nausea, near-syncope or shortness of breath. Treatments tried: ca channel blocker and ablation. The treatment provided moderate relief.    No problem-specific assessment & plan notes found for this encounter.   Past Medical History  Diagnosis Date  . Hypertension     Past Surgical History  Procedure Laterality Date  . Vaginal hysterectomy      partial  . Cardiac electrophysiology study and ablation    . Bunionectomy Left   . Colonoscopy  2011    cleared for 10 yrs- [facility]    Family History  Problem Relation Age of Onset  .  Breast cancer Mother [id number]  . Breast cancer Sister [id number]  . Breast cancer Other 3640    Social History   Social History  . Marital Status: Married    Spouse Name: N/A  . Number of Children: N/A  . Years of Education: N/A   Occupational History  . Not on file.   Social History Main Topics  . Smoking status: Former Games developermoker  . Smokeless tobacco: Not on file  . Alcohol Use: No  . Drug Use: No  . Sexual Activity: Not Currently   Other Topics Concern  . Not on file   Social History Narrative    Allergies  Allergen Reactions  . Sulfa Antibiotics Nausea And Vomiting     Review of Systems  Constitutional: Negative for fever, chills, weight loss and malaise/fatigue.  HENT: Negative for ear discharge, ear pain and sore throat.   Eyes: Negative for blurred vision.  Respiratory: Negative for cough, sputum production, shortness of breath and wheezing.   Cardiovascular: Positive for palpitations. Negative for chest pain, orthopnea, leg swelling, PND and near-syncope.  Gastrointestinal: Negative for heartburn, nausea, abdominal pain, diarrhea, constipation, blood in stool and melena.  Genitourinary: Negative for dysuria, urgency, frequency and hematuria.  Musculoskeletal: Negative for myalgias, back pain, joint pain and neck pain.  Skin: Negative for rash.  Neurological: Negative for dizziness, tingling, sensory change, focal weakness and headaches.  Endo/Heme/Allergies: Negative for environmental allergies and polydipsia. Does not bruise/bleed easily.  Psychiatric/Behavioral: Negative for depression and suicidal ideas. The patient is not nervous/anxious and does not have insomnia.      Objective  Filed Vitals:   09/03/15 0821  BP: 120/70  Pulse: 68  Height: 5' (1.524 m)  Weight: 146 lb (66.225 kg)    Physical Exam  Constitutional: She is well-developed, well-nourished, and in no distress. No distress.  HENT:  Head: Normocephalic and atraumatic.  Right Ear: External ear  normal.  Left Ear: External ear normal.  Nose: Nose normal.  Mouth/Throat: Oropharynx is clear and moist.  Eyes: Conjunctivae and EOM are normal. Pupils are equal, round, and reactive to light. Right eye exhibits no discharge. Left eye exhibits no discharge.  Neck: Normal range of motion. Neck supple. No JVD present. No thyromegaly present.  Cardiovascular: Normal rate, regular rhythm, normal heart sounds and intact distal pulses.  Exam reveals no gallop and no friction rub.   No murmur heard. Pulmonary/Chest: Effort normal and breath sounds normal.  Abdominal: Soft. Bowel sounds are normal. She exhibits no mass. There is no tenderness. There is no guarding.  Musculoskeletal: Normal range of motion. She exhibits no edema.  Lymphadenopathy:    She has no cervical adenopathy.  Neurological: She is alert. She has normal reflexes.  Skin: Skin is warm and dry. She is not diaphoretic.  Psychiatric: Mood and affect normal.      Assessment & Plan  Problem List Items Addressed This Visit      Cardiovascular and Mediastinum   AVNRT (AV nodal re-entry tachycardia) (HCC) - Primary        Dr. Elizabeth Sauer [facility]  09/03/2015

## 2015-09-10 ENCOUNTER — Ambulatory Visit: Payer: 59 | Admitting: Family Medicine

## 2015-11-19 DIAGNOSIS — M2011 Hallux valgus (acquired), right foot: Secondary | ICD-10-CM | POA: Diagnosis not present

## 2015-11-19 DIAGNOSIS — M71571 Other bursitis, not elsewhere classified, right ankle and foot: Secondary | ICD-10-CM | POA: Diagnosis not present

## 2015-12-03 DIAGNOSIS — M7731 Calcaneal spur, right foot: Secondary | ICD-10-CM | POA: Diagnosis not present

## 2015-12-03 DIAGNOSIS — M7732 Calcaneal spur, left foot: Secondary | ICD-10-CM | POA: Diagnosis not present

## 2015-12-03 DIAGNOSIS — Q664 Congenital talipes calcaneovalgus: Secondary | ICD-10-CM | POA: Diagnosis not present

## 2015-12-03 DIAGNOSIS — M2011 Hallux valgus (acquired), right foot: Secondary | ICD-10-CM | POA: Diagnosis not present

## 2015-12-03 DIAGNOSIS — M722 Plantar fascial fibromatosis: Secondary | ICD-10-CM | POA: Diagnosis not present

## 2016-01-07 DIAGNOSIS — M2010 Hallux valgus (acquired), unspecified foot: Secondary | ICD-10-CM | POA: Diagnosis not present

## 2016-01-28 DIAGNOSIS — M2010 Hallux valgus (acquired), unspecified foot: Secondary | ICD-10-CM | POA: Diagnosis not present

## 2016-03-16 ENCOUNTER — Encounter: Payer: Self-pay | Admitting: Family Medicine

## 2016-03-16 ENCOUNTER — Ambulatory Visit (INDEPENDENT_AMBULATORY_CARE_PROVIDER_SITE_OTHER): Payer: BLUE CROSS/BLUE SHIELD | Admitting: Family Medicine

## 2016-03-16 VITALS — BP 122/78 | HR 60 | Resp 16 | Ht 60.0 in | Wt 144.6 lb

## 2016-03-16 DIAGNOSIS — Z1239 Encounter for other screening for malignant neoplasm of breast: Secondary | ICD-10-CM

## 2016-03-16 DIAGNOSIS — E785 Hyperlipidemia, unspecified: Secondary | ICD-10-CM

## 2016-03-16 DIAGNOSIS — Z0000 Encounter for general adult medical examination without abnormal findings: Secondary | ICD-10-CM

## 2016-03-16 DIAGNOSIS — Z1231 Encounter for screening mammogram for malignant neoplasm of breast: Secondary | ICD-10-CM | POA: Diagnosis not present

## 2016-03-16 NOTE — Progress Notes (Signed)
Name: Kirsten Moore   MRN: Moore    DOB: October 24, 1955   Date:03/16/2016       Progress Note  Subjective  Chief Complaint  Chief Complaint  Patient presents with  . Annual Exam    Patient present for annual physical exam.    No problem-specific Assessment & Plan notes found for this encounter.   Past Medical History:  Diagnosis Date  . Hypertension     Past Surgical History:  Procedure Laterality Date  . BUNIONECTOMY Left   . CARDIAC ELECTROPHYSIOLOGY STUDY AND ABLATION    . COLONOSCOPY  2011   [facility]  . VAGINAL HYSTERECTOMY     partial    Family History  Problem Relation Age of Onset  . Breast cancer Other 40  . Breast cancer Mother [id number]  . Breast cancer Sister 1065    Social History   Social History  . Marital status: Married    Spouse name: N/A  . Number of children: N/A  . Years of education: N/A   Occupational History  . Not on file.   Social History Main Topics  . Smoking status: Former Games developermoker  . Smokeless tobacco: Never Used  . Alcohol use No  . Drug use: No  . Sexual activity: Not Currently   Other Topics Concern  . Not on file   Social History Narrative  . No narrative on file    Allergies  Allergen Reactions  . Sulfa Antibiotics Nausea And Vomiting     Review of Systems  Constitutional: Negative for chills, fever, malaise/fatigue and weight loss.  HENT: Negative for ear discharge, ear pain and sore throat.   Eyes: Negative for blurred vision.  Respiratory: Negative for cough, sputum production, shortness of breath and wheezing.   Cardiovascular: Negative for chest pain, palpitations and leg swelling.  Gastrointestinal: Negative for abdominal pain, blood in stool, constipation, diarrhea, heartburn, melena and nausea.  Genitourinary: Negative for dysuria, frequency, hematuria and urgency.  Musculoskeletal: Negative for back pain, joint pain, myalgias and neck pain.  Skin: Negative for rash.  Neurological:  Negative for dizziness, tingling, sensory change, focal weakness and headaches.  Endo/Heme/Allergies: Negative for environmental allergies and polydipsia. Does not bruise/bleed easily.  Psychiatric/Behavioral: Negative for depression and suicidal ideas. The patient is not nervous/anxious and does not have insomnia.      Objective  Vitals:   03/16/16 0941  BP: 122/78  Pulse: 60  Resp: 16  SpO2: 98%  Weight: 144 lb 9.6 oz (65.6 kg)  Height: 5' (1.524 m)    Physical Exam  Constitutional: She is well-developed, well-nourished, and in no distress. No distress.  HENT:  Head: Normocephalic and atraumatic.  Right Ear: Tympanic membrane, external ear and ear canal normal.  Left Ear: Tympanic membrane, external ear and ear canal normal.  Nose: Nose normal.  Mouth/Throat: Uvula is midline, oropharynx is clear and moist and mucous membranes are normal. No oropharyngeal exudate, posterior oropharyngeal edema, posterior oropharyngeal erythema or tonsillar abscesses.  Eyes: Conjunctivae, EOM and lids are normal. Pupils are equal, round, and reactive to light. Right eye exhibits no discharge. Left eye exhibits no discharge.  Fundoscopic exam:      The right eye shows no arteriolar narrowing, no AV nicking and no papilledema.       The left eye shows no arteriolar narrowing, no AV nicking and no papilledema.  Neck: Trachea normal and normal range of motion. Neck supple. No hepatojugular reflux and no JVD present. Carotid  bruit is not present. No tracheal deviation present. No thyromegaly present.  Cardiovascular: Normal rate, regular rhythm, S1 normal, S2 normal, normal heart sounds and intact distal pulses.  PMI is not displaced.  Exam reveals no gallop, no S3, no S4 and no friction rub.   No murmur heard. Pulmonary/Chest: Effort normal and breath sounds normal. No respiratory distress. She has no wheezes. She has no rales. She exhibits no tenderness. Right breast exhibits no inverted nipple, no  mass, no nipple discharge, no skin change and no tenderness. Left breast exhibits no inverted nipple, no mass, no nipple discharge, no skin change and no tenderness. Breasts are symmetrical.  Abdominal: Soft. Bowel sounds are normal. She exhibits no distension and no mass. There is no hepatosplenomegaly. There is no tenderness. There is no rebound, no guarding and no CVA tenderness.  Musculoskeletal: Normal range of motion. She exhibits no edema.  Lymphadenopathy:       Head (right side): No submental and no submandibular adenopathy present.       Head (left side): No submental and no submandibular adenopathy present.    She has no cervical adenopathy.       Right cervical: No superficial cervical adenopathy present.      Left cervical: No superficial cervical adenopathy present.    She has no axillary adenopathy.  Neurological: She is alert. She has normal sensation, normal strength, normal reflexes and intact cranial nerves.  Skin: Skin is warm, dry and intact. No rash noted. She is not diaphoretic.  Psychiatric: Mood, affect and judgment normal.  Nursing note and vitals reviewed.     Assessment & Plan  Problem List Items Addressed This Visit    None    Visit Diagnoses    Annual physical exam    -  Primary   Relevant Orders   POCT Urinalysis Dipstick   Breast cancer screening       Relevant Orders   MM Digital Screening        [facility]  03/16/16

## 2016-03-17 DIAGNOSIS — Q6689 Other  specified congenital deformities of feet: Secondary | ICD-10-CM | POA: Diagnosis not present

## 2016-03-17 DIAGNOSIS — M2010 Hallux valgus (acquired), unspecified foot: Secondary | ICD-10-CM | POA: Diagnosis not present

## 2016-03-17 LAB — POCT URINALYSIS DIPSTICK
BILIRUBIN UA: NEGATIVE
GLUCOSE UA: NEGATIVE
KETONES UA: NEGATIVE
Leukocytes, UA: NEGATIVE
Nitrite, UA: NEGATIVE
Protein, UA: NEGATIVE
RBC UA: NEGATIVE
SPEC GRAV UA: 1.02
Urobilinogen, UA: 0.2
pH, UA: 6

## 2016-03-17 LAB — RENAL FUNCTION PANEL
Albumin: 4.2 g/dL (ref 3.6–4.8)
BUN / CREAT RATIO: 22 (ref 12–28)
BUN: 14 mg/dL (ref 8–27)
CALCIUM: 9.2 mg/dL (ref 8.7–10.3)
CO2: 26 mmol/L (ref 18–29)
CREATININE: 0.64 mg/dL (ref 0.57–1.00)
Chloride: 100 mmol/L (ref 96–106)
GFR calc Af Amer: 111 mL/min/{1.73_m2} (ref >59)
GFR calc non Af Amer: 96 mL/min/{1.73_m2} (ref >59)
Glucose: 83 mg/dL (ref 65–99)
Phosphorus: 3.7 mg/dL (ref 2.5–4.5)
Potassium: 4.7 mmol/L (ref 3.5–5.2)
SODIUM: 140 mmol/L (ref 134–144)

## 2016-03-17 LAB — LIPID PANEL
CHOL/HDL RATIO: 3.5 ratio (ref 0.0–4.4)
Cholesterol, Total: 249 mg/dL — ABNORMAL HIGH (ref 100–199)
HDL: 72 mg/dL (ref >39)
LDL CALC: 154 mg/dL — AB (ref 0–99)
TRIGLYCERIDES: 116 mg/dL (ref 0–149)
VLDL Cholesterol Cal: 23 mg/dL (ref 5–40)

## 2016-04-06 ENCOUNTER — Ambulatory Visit
Admission: RE | Admit: 2016-04-06 | Discharge: 2016-04-06 | Disposition: A | Discharge status: Home / Self Care | Payer: BLUE CROSS/BLUE SHIELD | Source: Ambulatory Visit | Attending: Family Medicine | Admitting: Family Medicine

## 2016-04-06 DIAGNOSIS — Z1231 Encounter for screening mammogram for malignant neoplasm of breast: Secondary | ICD-10-CM | POA: Diagnosis not present

## 2016-04-06 DIAGNOSIS — Z1239 Encounter for other screening for malignant neoplasm of breast: Secondary | ICD-10-CM

## 2016-04-27 DIAGNOSIS — D229 Melanocytic nevi, unspecified: Secondary | ICD-10-CM | POA: Diagnosis not present

## 2016-04-27 DIAGNOSIS — Z872 Personal history of diseases of the skin and subcutaneous tissue: Secondary | ICD-10-CM | POA: Diagnosis not present

## 2016-06-16 DIAGNOSIS — M2010 Hallux valgus (acquired), unspecified foot: Secondary | ICD-10-CM | POA: Diagnosis not present

## 2016-06-16 DIAGNOSIS — Q6689 Other  specified congenital deformities of feet: Secondary | ICD-10-CM | POA: Diagnosis not present

## 2016-07-10 ENCOUNTER — Other Ambulatory Visit: Payer: Self-pay

## 2016-07-21 DIAGNOSIS — Q6689 Other  specified congenital deformities of feet: Secondary | ICD-10-CM | POA: Diagnosis not present

## 2016-07-21 DIAGNOSIS — M2010 Hallux valgus (acquired), unspecified foot: Secondary | ICD-10-CM | POA: Diagnosis not present

## 2016-08-18 DIAGNOSIS — M2010 Hallux valgus (acquired), unspecified foot: Secondary | ICD-10-CM | POA: Diagnosis not present

## 2016-10-06 IMAGING — CT CT HEAD W/O CM
1 series · 16 of 30 positions shown, 20 images · non-contrast
Comparison: None.

CLINICAL DATA: "Left face, left arm, left arm odd normal behavior."
Electrical shooting pain down the left arm today.

EXAM:
CT HEAD WITHOUT CONTRAST
TECHNIQUE: Contiguous axial images were obtained from the base of the skull
through the vertex without intravenous contrast.

[Series 2: head wo · axial · 0.43mm/px · z∈[-191,-65]mm · 16 of 32 slices shown, 20 images]
[im 2/32  brain]
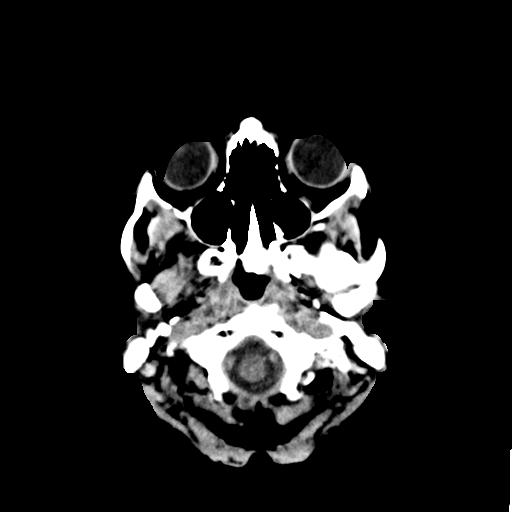
[im 2/32  bone]
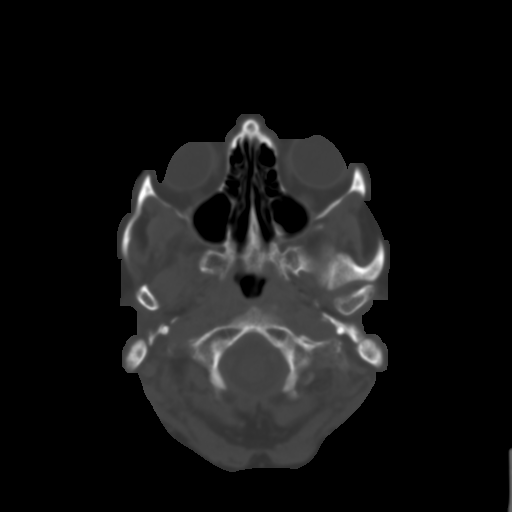
[im 4/32  brain]
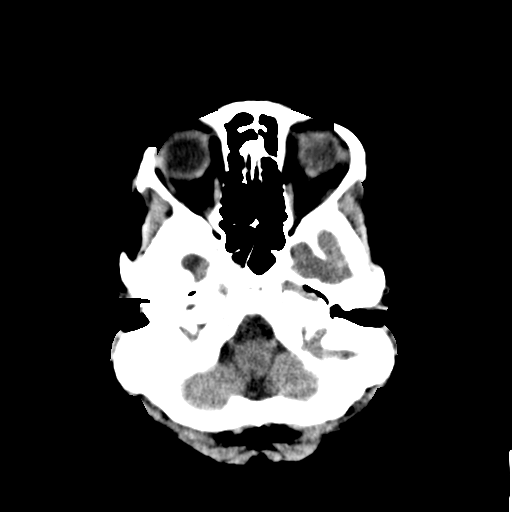
[im 6/32  brain]
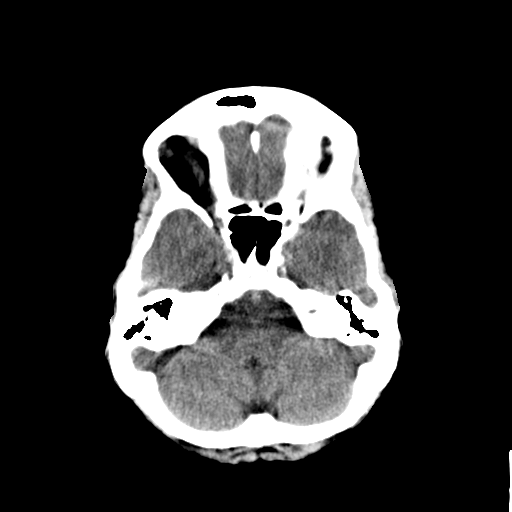
[im 8/32  brain]
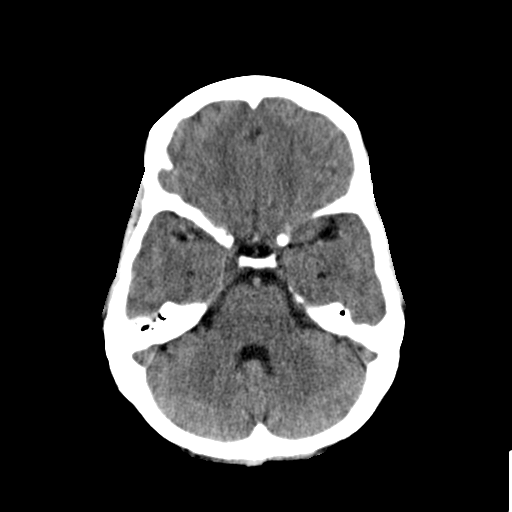
[im 9/32  brain]
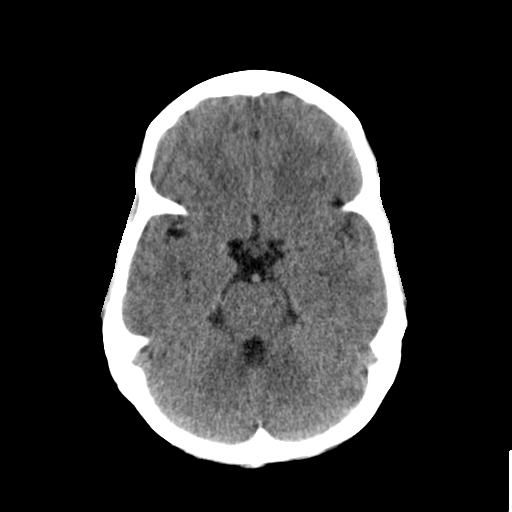
[im 9/32  bone]
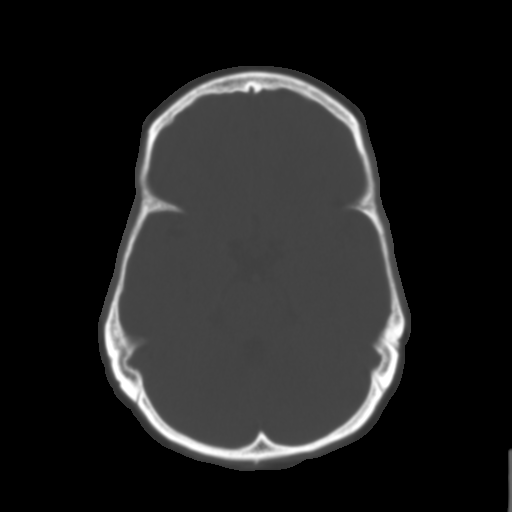
[im 11/32  brain]
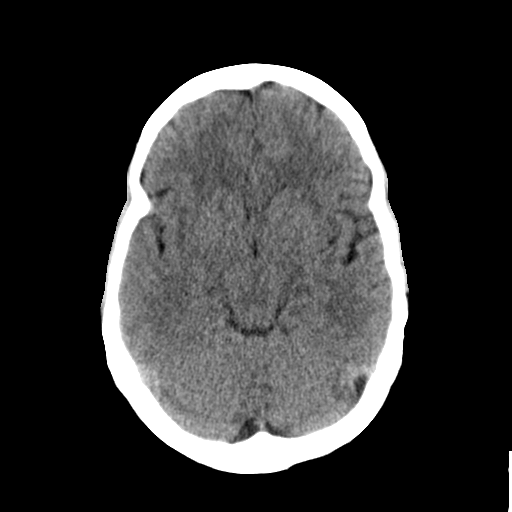
[im 13/32  brain]
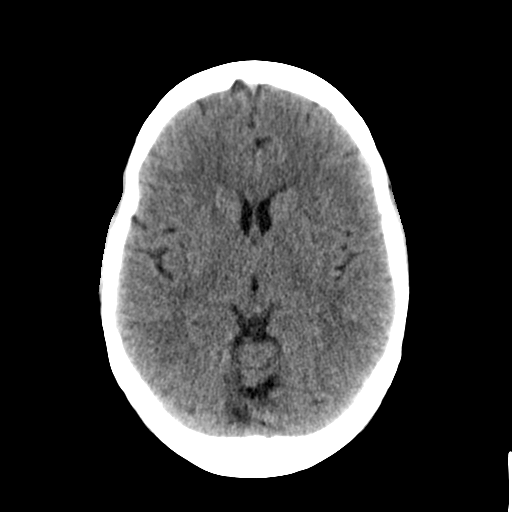
[im 15/32  brain]
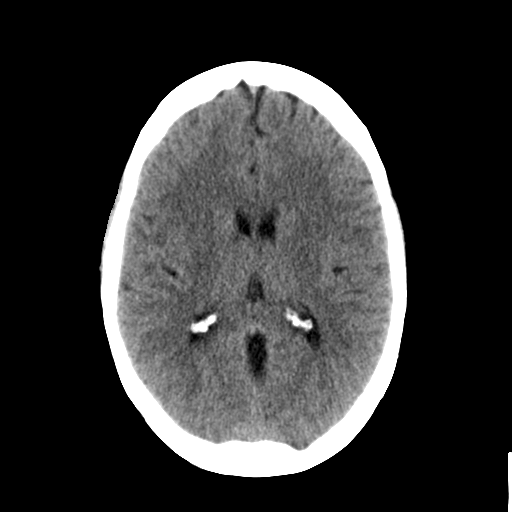
[im 17/32  brain]
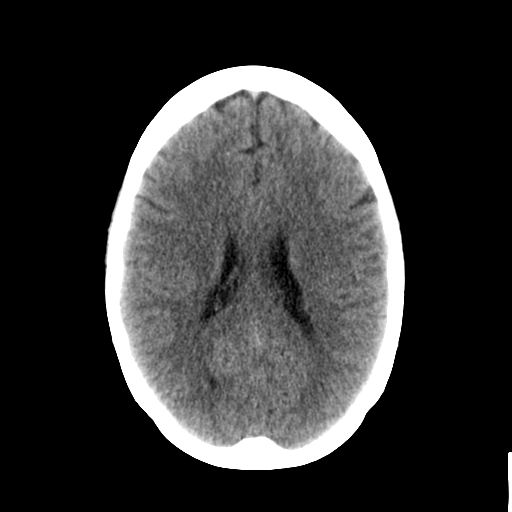
[im 17/32  bone]
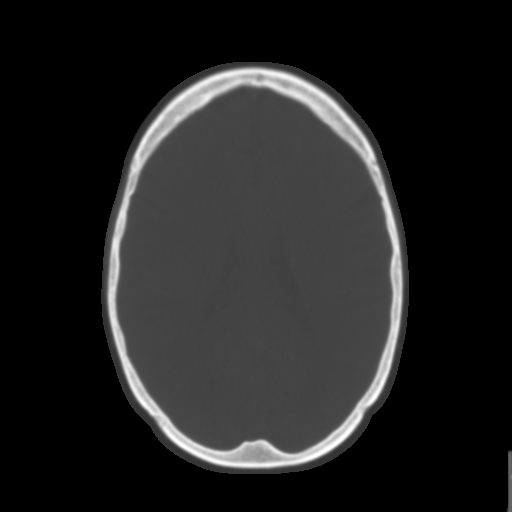
[im 19/32  brain]
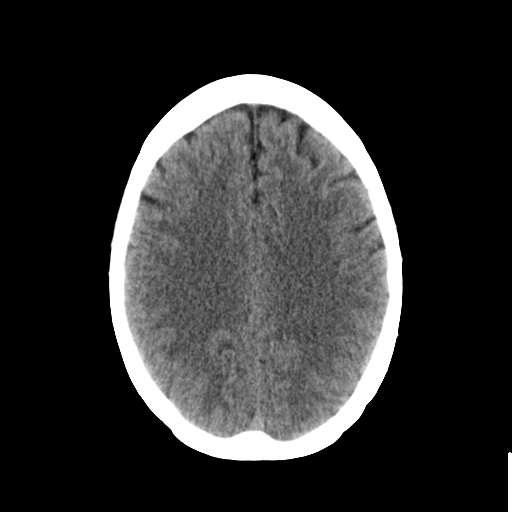
[im 21/32  brain]
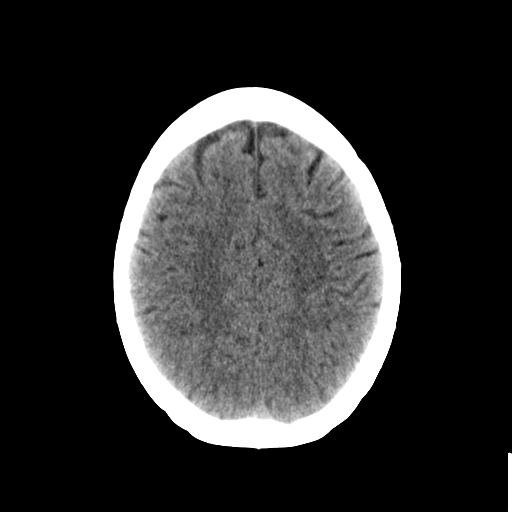
[im 23/32  brain]
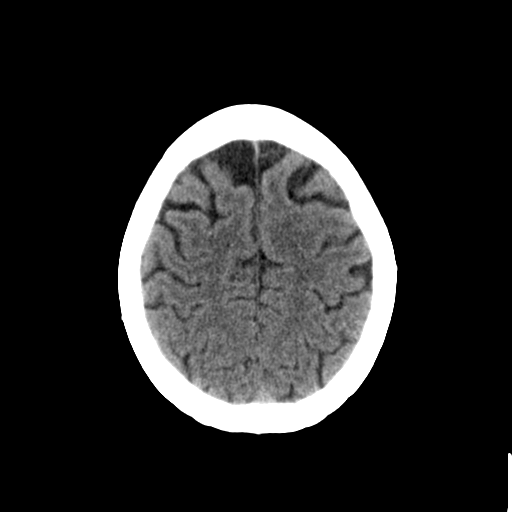
[im 24/32  brain]
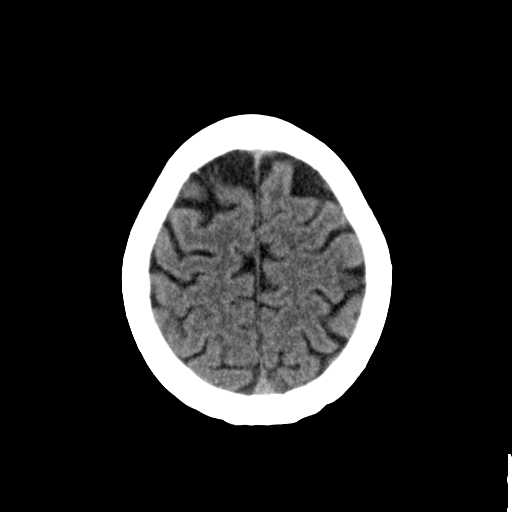
[im 24/32  bone]
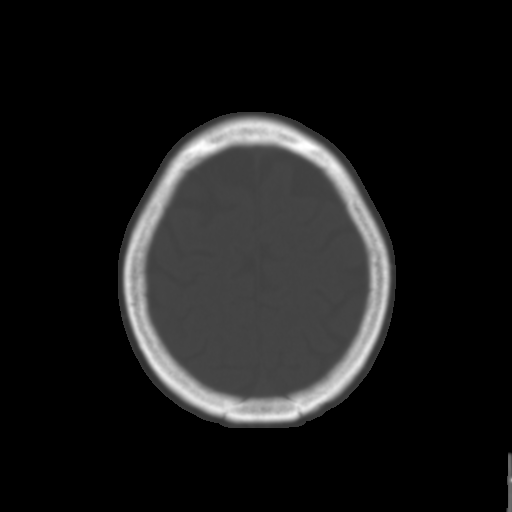
[im 26/32  brain]
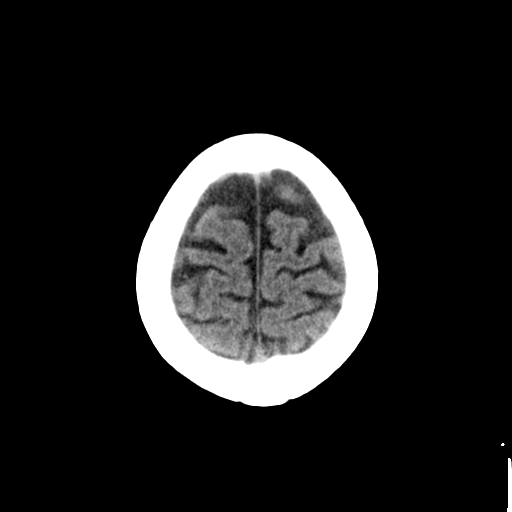
[im 28/32  brain]
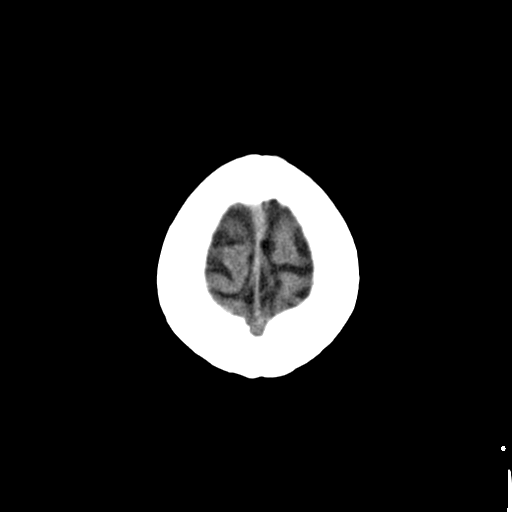
[im 30/32  brain]
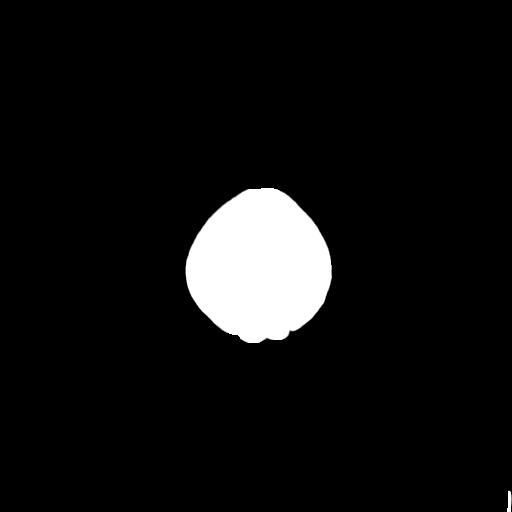

[16 of 30 positions shown; findings below may reference images not displayed]

FINDINGS: Skull and Sinuses:Negative for fracture or destructive process. The
mastoids, middle ears, and imaged paranasal sinuses are clear.

Orbits: No acute abnormality.

Brain: Unremarkable. No evidence of acute infarction, hemorrhage,
hydrocephalus, or mass lesion/mass effect.
IMPRESSION: Negative head CT.

## 2016-10-06 IMAGING — CR DG CHEST 2V
2 series · 2 of 2 positions shown · non-contrast
Comparison: 04/13/2009

CLINICAL DATA: Several episodes of shock sensation left arm
radiating to neck with intermittent chest tightness today

EXAM:
CHEST  2 VIEW

[chest pa]
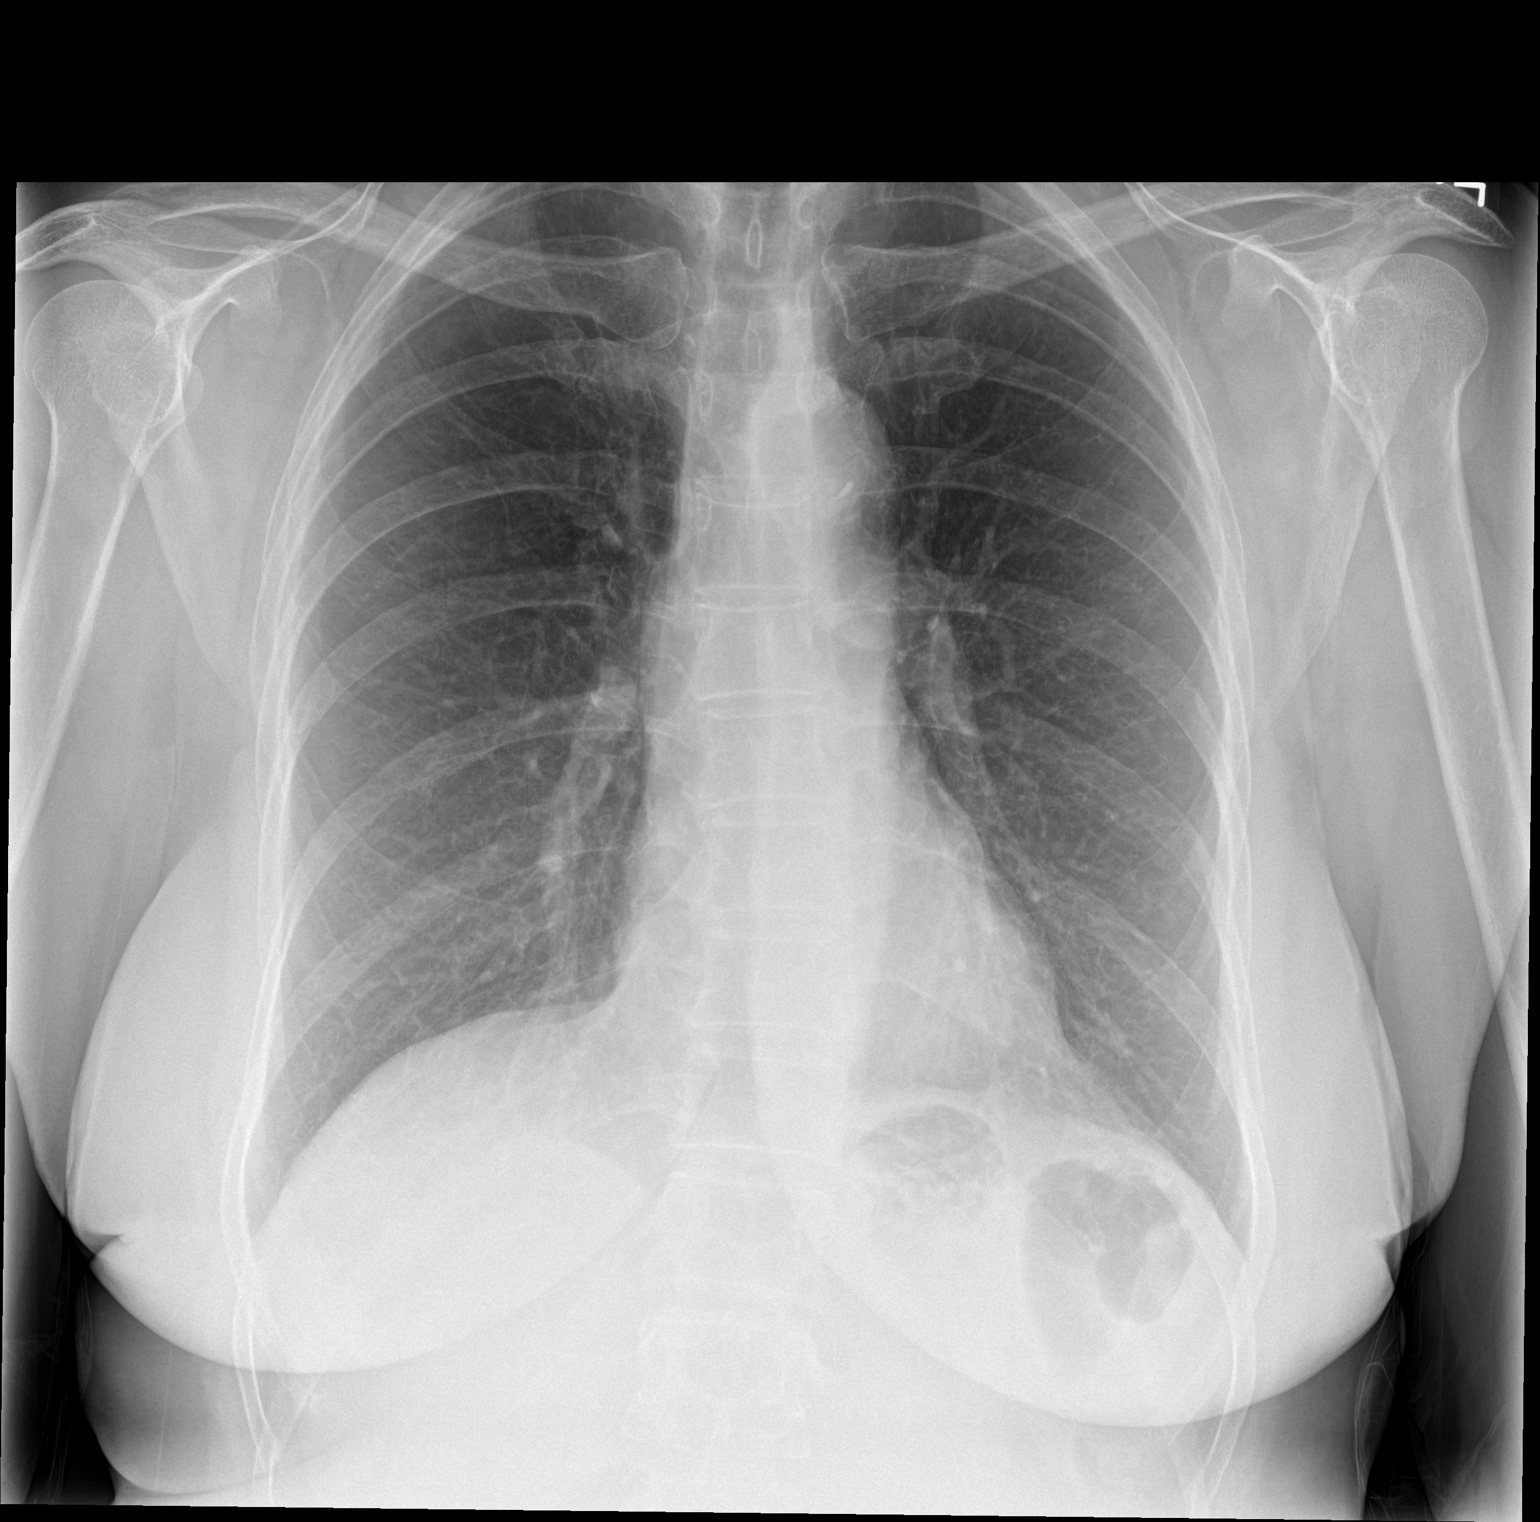

[chest lat]
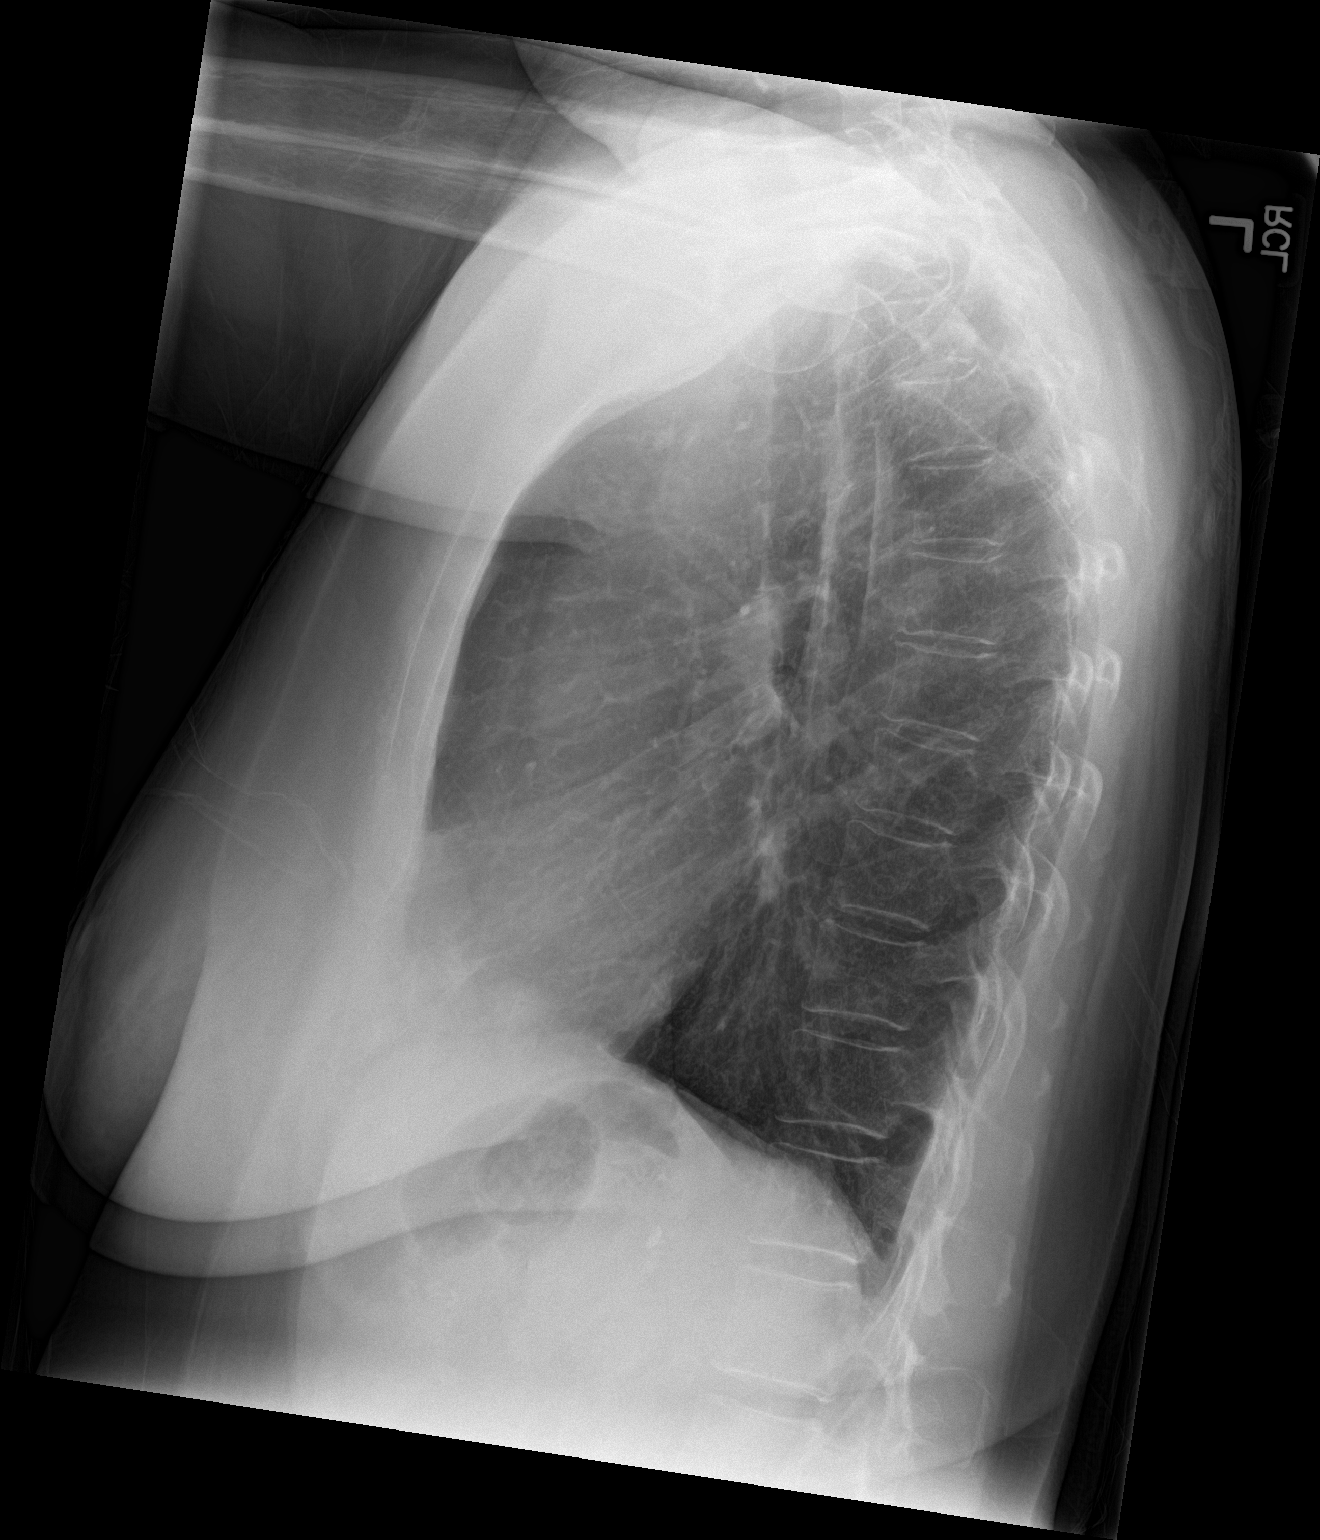

[2 of 2 positions shown; findings below may reference images not displayed]

FINDINGS: The heart size and mediastinal contours are within normal limits.
Both lungs are clear. The visualized skeletal structures are
unremarkable.
IMPRESSION: No active cardiopulmonary disease.

## 2016-10-06 IMAGING — MR MR HEAD W/O CM
10 series · 41 of 48 positions shown · non-contrast
Comparison: CT head earlier today was negative.

CLINICAL DATA: LEFT arm and LEFT face weakness. Symptoms for 2
days. History of hypertension with no current medications.

EXAM:
MRI HEAD WITHOUT CONTRAST
TECHNIQUE: Multiplanar, multiecho pulse sequences of the brain and surrounding
structures were obtained without intravenous contrast.

[Series 2: T1 · sagittal · 5.0mm · 0.45mm/px · 3 of 27 slices shown (1 of 2)]
[im 1/27]
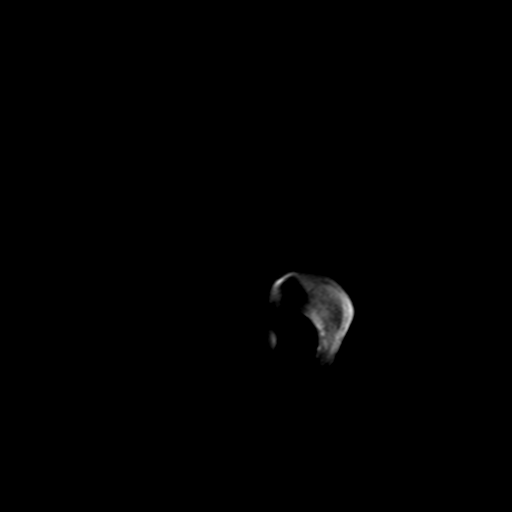
[im 14/27]
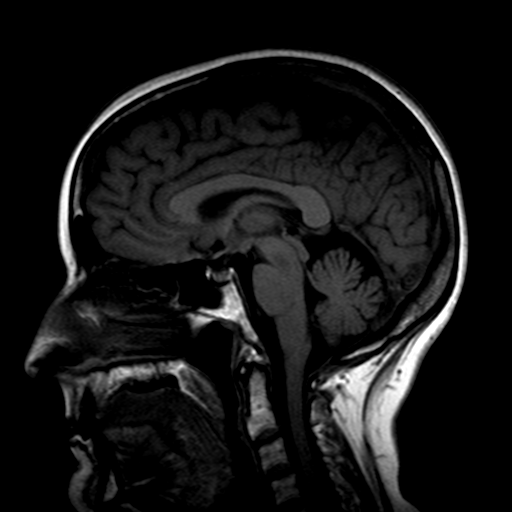
[im 27/27]
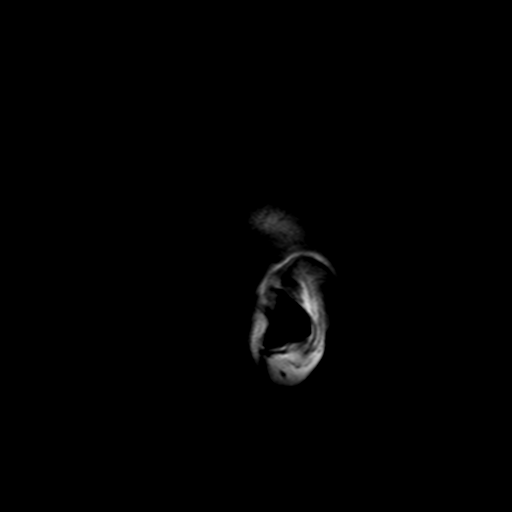

[Series 4: DWI · axial · 4.0mm · 0.94mm/px · z∈[-17,+138]mm · 6 of 40 slices shown (1 of 4)]
[im 1/40]
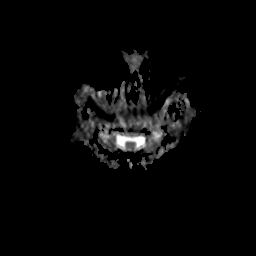
[im 8/40]
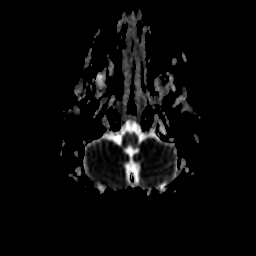
[im 16/40]
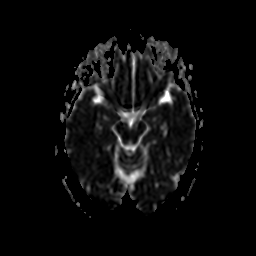
[im 24/40]
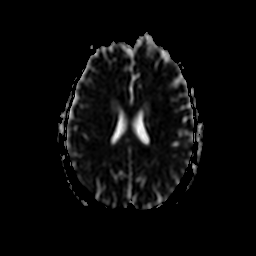
[im 32/40]
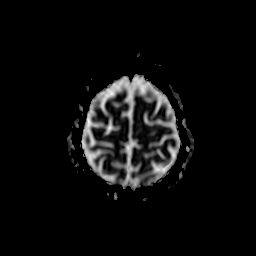
[im 40/40]
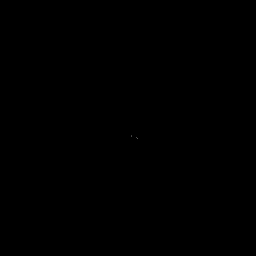

[Series 5: DWI · axial · 4.0mm · 0.94mm/px · z∈[-13,+134]mm · 5 of 38 slices shown (2 of 4)]
[im 1/38]
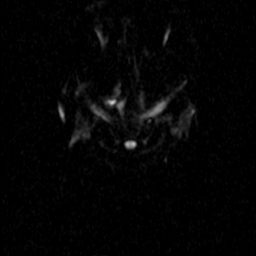
[im 10/38]
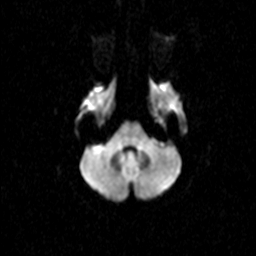
[im 19/38]
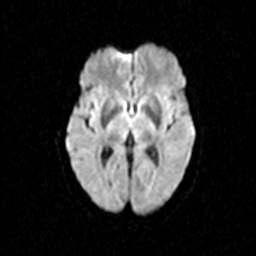
[im 28/38]
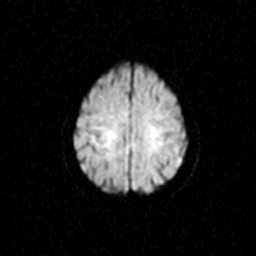
[im 38/38]
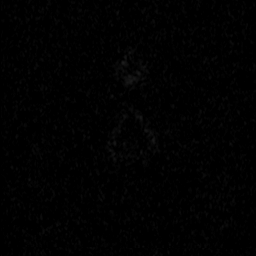

[Series 7: DWI · coronal · 5.0mm · 1.80mm/px · 5 of 38 slices shown (3 of 4)]
[im 1/38]
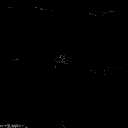
[im 10/38]
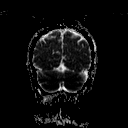
[im 19/38]
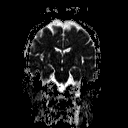
[im 28/38]
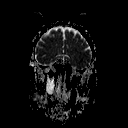
[im 38/38]
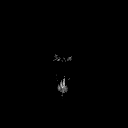

[Series 8: DWI · coronal · 5.0mm · 1.80mm/px · 5 of 37 slices shown (4 of 4)]
[im 1/37]
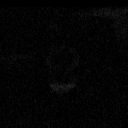
[im 10/37]
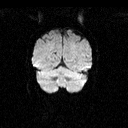
[im 19/37]
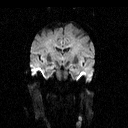
[im 28/37]
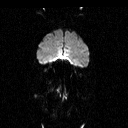
[im 37/37]
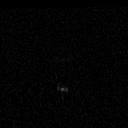

[Series 9: T2 · axial · 5.0mm · 0.45mm/px · z∈[-26,+135]mm · 4 of 26 slices shown (1 of 3)]
[im 1/26]
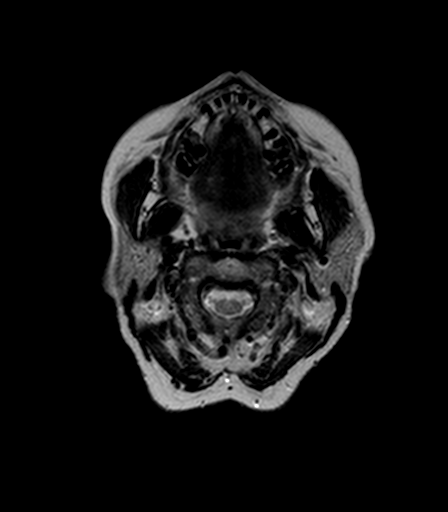
[im 9/26]
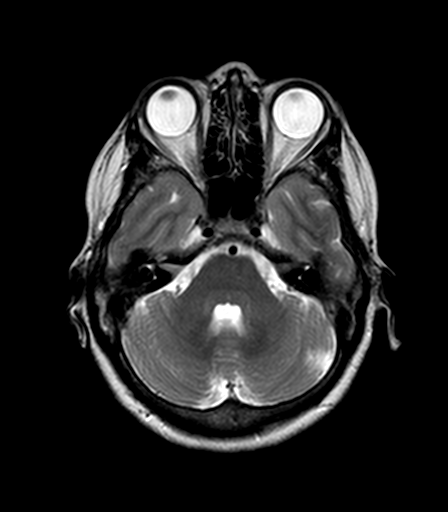
[im 17/26]
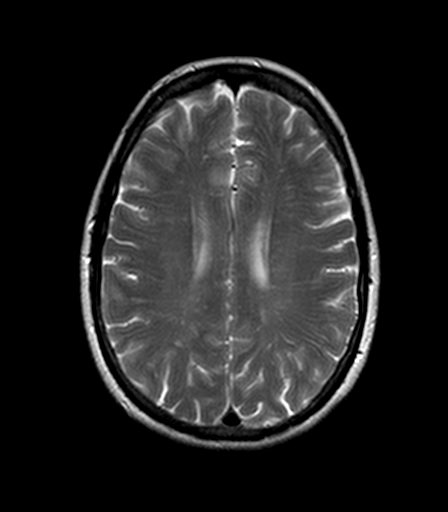
[im 26/26]
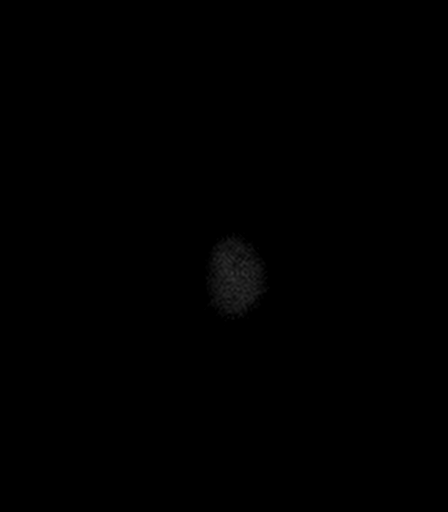

[Series 10: FLAIR · axial · 5.0mm · 0.90mm/px · z∈[-27,+134]mm · 4 of 26 slices shown]
[im 1/26]
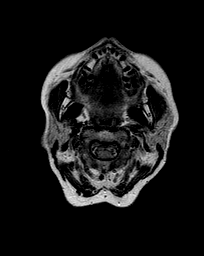
[im 9/26]
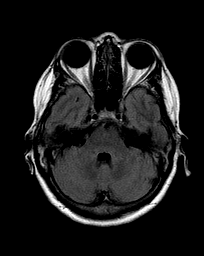
[im 17/26]
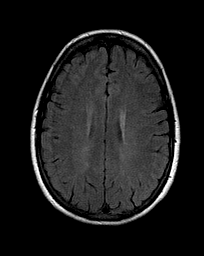
[im 26/26]
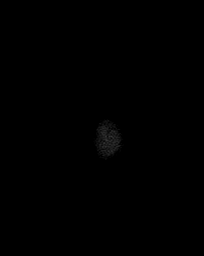

[Series 11: T2 · axial · 5.0mm · 0.45mm/px · z∈[-26,+135]mm · 4 of 26 slices shown (2 of 3)]
[im 1/26]
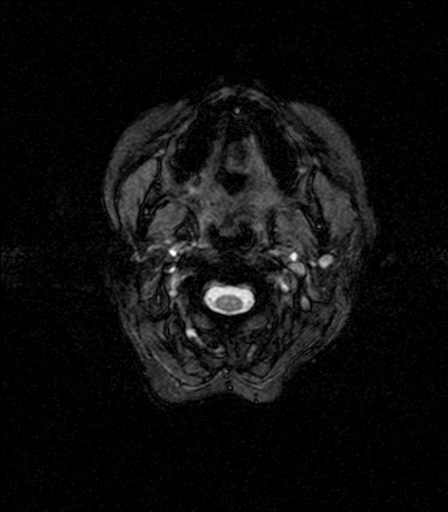
[im 9/26]
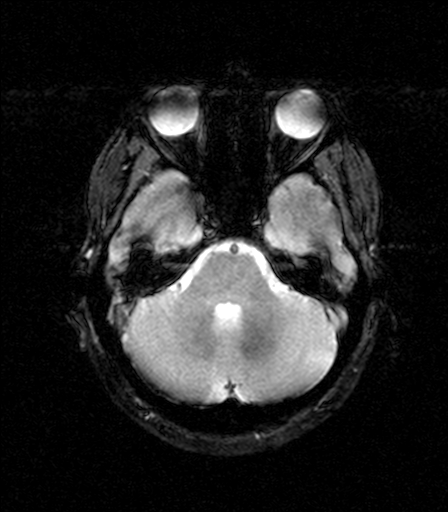
[im 17/26]
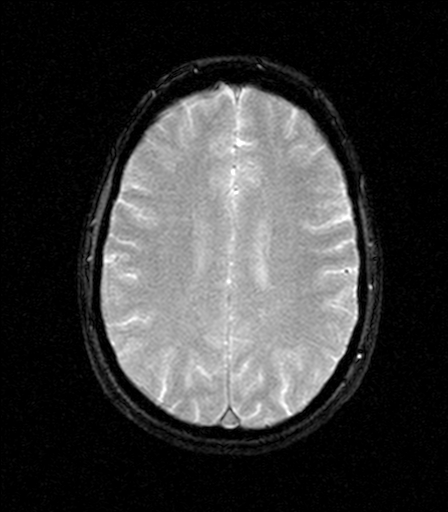
[im 26/26]
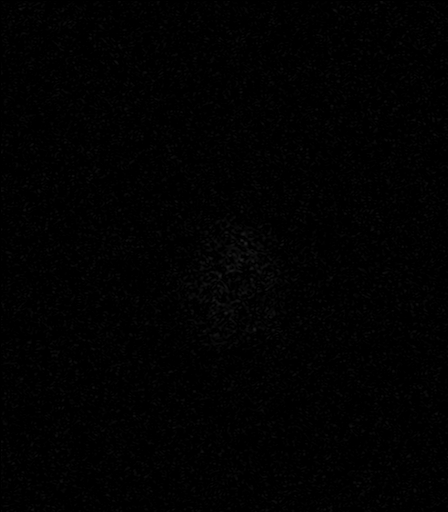

[Series 12: T1 · axial · 3.0mm · 0.45mm/px · 1 of 56 slices shown (2 of 2)]
[im 1/56]
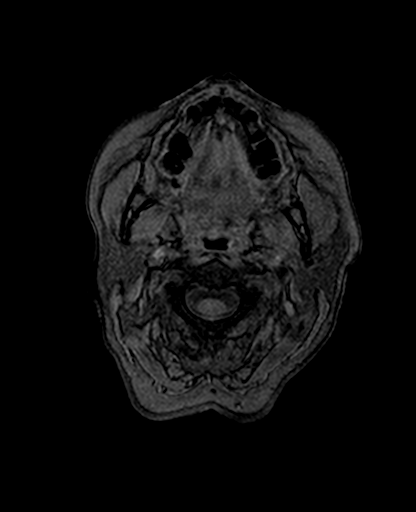

[Series 13: T2 · coronal · 5.0mm · 0.45mm/px · 4 of 29 slices shown (3 of 3)]
[im 1/29]
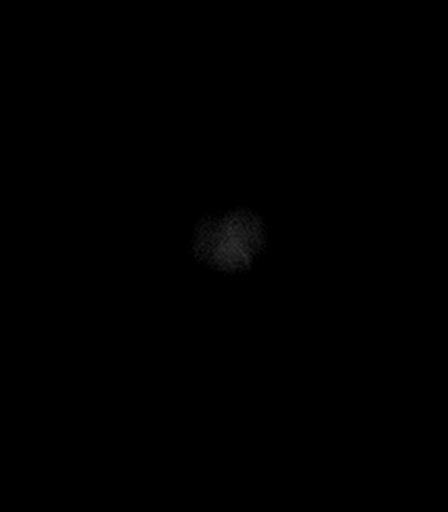
[im 10/29]
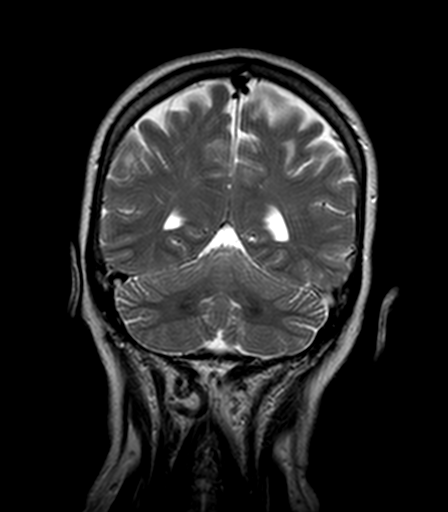
[im 19/29]
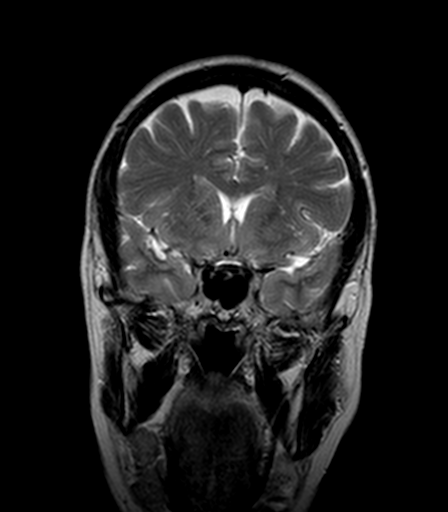
[im 29/29]
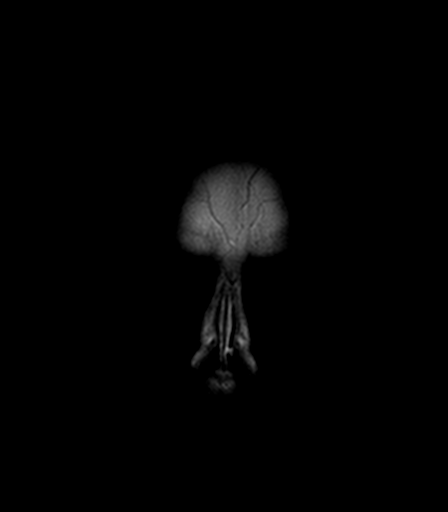

[41 of 48 positions shown; findings below may reference images not displayed]

FINDINGS: No evidence for acute infarction, hemorrhage, mass lesion,
hydrocephalus, or extra-axial fluid. Normal cerebral volume. Mildly
prominent perivascular spaces could suggest chronic hypertension.
Mild subcortical and periventricular T2 and FLAIR hyperintensities,
likely chronic microvascular ischemic change.

Flow voids are maintained throughout the carotid, basilar, and
vertebral arteries. There are no areas of chronic hemorrhage.

Pituitary, pineal, and cerebellar tonsils unremarkable. No upper
cervical lesions.

Visualized calvarium, skull base, and upper cervical osseous
structures unremarkable. Scalp and extracranial soft tissues,
orbits, sinuses, and mastoids show no acute process.

Similar appearance to prior CT.
IMPRESSION: Minor white matter disease. Mild prominence perivascular spaces
likely sequelae of chronic hypertension.

No acute intracranial findings.

## 2016-10-20 DIAGNOSIS — Q6689 Other  specified congenital deformities of feet: Secondary | ICD-10-CM | POA: Diagnosis not present

## 2016-10-20 DIAGNOSIS — M2010 Hallux valgus (acquired), unspecified foot: Secondary | ICD-10-CM | POA: Diagnosis not present

## 2017-01-26 DIAGNOSIS — Q6689 Other  specified congenital deformities of feet: Secondary | ICD-10-CM | POA: Diagnosis not present

## 2017-01-26 DIAGNOSIS — M2010 Hallux valgus (acquired), unspecified foot: Secondary | ICD-10-CM | POA: Diagnosis not present

## 2017-01-27 ENCOUNTER — Ambulatory Visit (INDEPENDENT_AMBULATORY_CARE_PROVIDER_SITE_OTHER): Payer: BLUE CROSS/BLUE SHIELD

## 2017-01-27 DIAGNOSIS — Z23 Encounter for immunization: Secondary | ICD-10-CM | POA: Diagnosis not present

## 2017-03-29 ENCOUNTER — Encounter: Payer: BLUE CROSS/BLUE SHIELD | Admitting: Family Medicine

## 2017-03-31 ENCOUNTER — Encounter: Payer: Self-pay | Admitting: Family Medicine

## 2017-03-31 ENCOUNTER — Ambulatory Visit (INDEPENDENT_AMBULATORY_CARE_PROVIDER_SITE_OTHER): Payer: BLUE CROSS/BLUE SHIELD | Admitting: Family Medicine

## 2017-03-31 VITALS — BP 120/82 | HR 60 | Ht 60.0 in | Wt 144.0 lb

## 2017-03-31 DIAGNOSIS — Z0000 Encounter for general adult medical examination without abnormal findings: Secondary | ICD-10-CM | POA: Diagnosis not present

## 2017-03-31 DIAGNOSIS — E7800 Pure hypercholesterolemia, unspecified: Secondary | ICD-10-CM

## 2017-03-31 DIAGNOSIS — I471 Supraventricular tachycardia: Secondary | ICD-10-CM | POA: Diagnosis not present

## 2017-03-31 DIAGNOSIS — Z1211 Encounter for screening for malignant neoplasm of colon: Secondary | ICD-10-CM | POA: Diagnosis not present

## 2017-03-31 DIAGNOSIS — Z1231 Encounter for screening mammogram for malignant neoplasm of breast: Secondary | ICD-10-CM | POA: Diagnosis not present

## 2017-03-31 DIAGNOSIS — Z1239 Encounter for other screening for malignant neoplasm of breast: Secondary | ICD-10-CM

## 2017-03-31 LAB — POCT URINALYSIS DIPSTICK
Bilirubin, UA: NEGATIVE
Blood, UA: NEGATIVE
GLUCOSE UA: NEGATIVE
Ketones, UA: NEGATIVE
LEUKOCYTES UA: NEGATIVE
NITRITE UA: NEGATIVE
PROTEIN UA: NEGATIVE
Spec Grav, UA: 1.01 (ref 1.010–1.025)
Urobilinogen, UA: 0.2 E.U./dL
pH, UA: 5 (ref 5.0–8.0)

## 2017-03-31 LAB — HEMOCCULT GUIAC POC 1CARD (OFFICE): FECAL OCCULT BLD: NEGATIVE

## 2017-03-31 NOTE — Progress Notes (Signed)
Name: Kirsten NeighborBetsy Daune Divirgilio Moore   MRN: [id number]    DOB: 1956-02-19   Date:03/31/2017       Progress Note  Subjective  Chief Complaint  Chief Complaint  Patient presents with  . Annual Exam    no pap- mammo after 04/06/17    Patient presents for annual physical exam.    No problem-specific Assessment & Plan notes found for this encounter.   Past Medical History:  Diagnosis Date  . Hypertension     Past Surgical History:  Procedure Laterality Date  . BUNIONECTOMY Left   . CARDIAC ELECTROPHYSIOLOGY STUDY AND ABLATION    . COLONOSCOPY  2011   [facility]rs[facility]  . VAGINAL HYSTERECTOMY     partial    Family History  Problem Relation Age of Onset  . Breast cancer Other 40  . Breast cancer Mother 5775  . Breast cancer Sister 5765    Social History   Socioeconomic History  . Marital status: Married    Spouse name: Not on file  . Number of children: Not on file  . Years of education: Not on file  . Highest education level: Not on file  Social Needs  . Financial resource strain: Not on file  . Food insecurity - worry: Not on file  . Food insecurity - inability: Not on file  . Transportation needs - medical: Not on file  . Transportation needs - non-medical: Not on file  Occupational History  . Not on file  Tobacco Use  . Smoking status: Former Games developermoker  . Smokeless tobacco: Never Used  Substance and Sexual Activity  . Alcohol use: No    Alcohol/week: 0.0 oz  . Drug use: No  . Sexual activity: Not Currently  Other Topics Concern  . Not on file  Social History Narrative  . Not on file    Allergies  Allergen Reactions  . Sulfa Antibiotics Nausea And Vomiting    No outpatient medications prior to visit.   No facility-administered medications prior to visit.     Review of Systems  Constitutional: Negative for chills, fever, malaise/fatigue and weight loss.  HENT: Negative for ear discharge, ear pain and sore throat.   Eyes: Negative for blurred vision.   Respiratory: Negative for cough, sputum production, shortness of breath and wheezing.   Cardiovascular: Negative for chest pain, palpitations and leg swelling.  Gastrointestinal: Negative for abdominal pain, blood in stool, constipation, diarrhea, heartburn, melena and nausea.  Genitourinary: Negative for dysuria, frequency, hematuria and urgency.  Musculoskeletal: Negative for back pain, joint pain, myalgias and neck pain.  Skin: Negative for rash.  Neurological: Negative for dizziness, tingling, sensory change, focal weakness and headaches.  Endo/Heme/Allergies: Negative for environmental allergies and polydipsia. Does not bruise/bleed easily.  Psychiatric/Behavioral: Negative for depression and suicidal ideas. The patient is not nervous/anxious and does not have insomnia.      Objective  Vitals:   03/31/17 0818  BP: 120/82  Pulse: 60  Weight: 144 lb (65.3 kg)  Height: 5' (1.524 m)    Physical Exam  Constitutional: She is well-developed, well-nourished, and in no distress. No distress.  HENT:  Head: Normocephalic and atraumatic.  Right Ear: Hearing, tympanic membrane, external ear and ear canal normal.  Left Ear: Hearing, tympanic membrane, external ear and ear canal normal.  Nose: Nose normal.  Mouth/Throat: Uvula is midline, oropharynx is clear and moist and mucous membranes are normal. No oropharyngeal exudate, posterior oropharyngeal edema, posterior oropharyngeal erythema or tonsillar abscesses.  Eyes: Conjunctivae, EOM and lids are normal. Pupils are equal, round, and reactive to light. Right eye exhibits no discharge. Left eye exhibits no discharge.  Fundoscopic exam:      The right eye shows no arteriolar narrowing and no AV nicking.       The left eye shows no arteriolar narrowing and no AV nicking.  Neck: Normal range of motion. Neck supple. Normal carotid pulses, no hepatojugular reflux and no JVD present. Carotid bruit is not present. No thyromegaly present.   Cardiovascular: Normal rate, regular rhythm, S1 normal, S2 normal, normal heart sounds, intact distal pulses and normal pulses. PMI is not displaced. Exam reveals no gallop, no S3, no S4 and no friction rub.  No murmur heard. Pulmonary/Chest: Effort normal and breath sounds normal. No accessory muscle usage or stridor. No respiratory distress. She has no wheezes. She has no rales. Right breast exhibits no inverted nipple, no mass, no nipple discharge, no skin change and no tenderness. Left breast exhibits no inverted nipple, no mass, no nipple discharge, no skin change and no tenderness. Breasts are symmetrical.  Abdominal: Soft. Bowel sounds are normal. She exhibits no distension and no mass. There is no hepatosplenomegaly. There is no tenderness. There is no rebound, no guarding and no CVA tenderness.  Genitourinary: Rectal exam shows external hemorrhoid. Rectal exam shows guaiac negative stool.  Musculoskeletal: Normal range of motion. She exhibits no edema.       Thoracic back: Normal.       Lumbar back: Normal.  Lymphadenopathy:       Head (right side): No submental and no submandibular adenopathy present.       Head (left side): No submental and no submandibular adenopathy present.    She has no cervical adenopathy.    She has no axillary adenopathy.  Neurological: She is alert. She has normal motor skills, normal sensation, normal strength, normal reflexes and intact cranial nerves.  Skin: Skin is warm, dry and intact. She is not diaphoretic.  Psychiatric: Mood and affect normal.  Nursing note and vitals reviewed.     Assessment & Plan  Problem List Items Addressed This Visit      Cardiovascular and Mediastinum   AVNRT (AV nodal re-entry tachycardia) (HCC)    Other Visit Diagnoses    Annual physical exam    -  Primary   Relevant Orders   POCT occult blood stool (Completed)   POCT urinalysis dipstick (Completed)   Renal Function Panel   Lipid Profile   Colon cancer  screening       Relevant Orders   POCT occult blood stool (Completed)   Breast cancer screening       Pure hypercholesterolemia       Relevant Orders   Lipid Profile      No orders of the defined types were placed in this encounter.     Dr. Hayden [facility]  03/31/17

## 2017-04-01 LAB — RENAL FUNCTION PANEL
Albumin: 4.2 g/dL (ref 3.6–4.8)
BUN / CREAT RATIO: 16 (ref 12–28)
BUN: 13 mg/dL (ref 8–27)
CHLORIDE: 104 mmol/L (ref 96–106)
CO2: 21 mmol/L (ref 20–29)
Calcium: 9.3 mg/dL (ref 8.7–10.3)
Creatinine, Ser: 0.79 mg/dL (ref 0.57–1.00)
GFR, EST AFRICAN AMERICAN: 92 mL/min/{1.73_m2} (ref >59)
GFR, EST NON AFRICAN AMERICAN: 80 mL/min/{1.73_m2} (ref >59)
Glucose: 97 mg/dL (ref 65–99)
POTASSIUM: 4.6 mmol/L (ref 3.5–5.2)
Phosphorus: 3.8 mg/dL (ref 2.5–4.5)
SODIUM: 141 mmol/L (ref 134–144)

## 2017-04-01 LAB — LIPID PANEL
CHOL/HDL RATIO: 3.7 ratio (ref 0.0–4.4)
Cholesterol, Total: 277 mg/dL — ABNORMAL HIGH (ref 100–199)
HDL: 74 mg/dL (ref >39)
LDL Calculated: 178 mg/dL — ABNORMAL HIGH (ref 0–99)
Triglycerides: 124 mg/dL (ref 0–149)
VLDL Cholesterol Cal: 25 mg/dL (ref 5–40)

## 2017-04-07 ENCOUNTER — Other Ambulatory Visit: Payer: Self-pay

## 2017-04-07 DIAGNOSIS — Z1239 Encounter for other screening for malignant neoplasm of breast: Secondary | ICD-10-CM

## 2017-04-20 ENCOUNTER — Ambulatory Visit: Payer: 59

## 2017-04-27 DIAGNOSIS — L570 Actinic keratosis: Secondary | ICD-10-CM | POA: Diagnosis not present

## 2017-04-27 DIAGNOSIS — L578 Other skin changes due to chronic exposure to nonionizing radiation: Secondary | ICD-10-CM | POA: Diagnosis not present

## 2017-04-27 DIAGNOSIS — Z872 Personal history of diseases of the skin and subcutaneous tissue: Secondary | ICD-10-CM | POA: Diagnosis not present

## 2017-04-27 DIAGNOSIS — D229 Melanocytic nevi, unspecified: Secondary | ICD-10-CM | POA: Diagnosis not present

## 2017-04-28 ENCOUNTER — Ambulatory Visit
Admission: RE | Admit: 2017-04-28 | Discharge: 2017-04-28 | Disposition: A | Discharge status: Home / Self Care | Payer: BLUE CROSS/BLUE SHIELD | Source: Ambulatory Visit | Attending: Family Medicine | Admitting: Family Medicine

## 2017-04-28 DIAGNOSIS — R928 Other abnormal and inconclusive findings on diagnostic imaging of breast: Secondary | ICD-10-CM | POA: Diagnosis not present

## 2017-04-28 DIAGNOSIS — Z1239 Encounter for other screening for malignant neoplasm of breast: Secondary | ICD-10-CM

## 2017-04-28 DIAGNOSIS — R921 Mammographic calcification found on diagnostic imaging of breast: Secondary | ICD-10-CM | POA: Insufficient documentation

## 2017-04-28 DIAGNOSIS — Z1231 Encounter for screening mammogram for malignant neoplasm of breast: Secondary | ICD-10-CM | POA: Diagnosis not present

## 2017-04-28 DIAGNOSIS — M2010 Hallux valgus (acquired), unspecified foot: Secondary | ICD-10-CM | POA: Diagnosis not present

## 2017-04-28 DIAGNOSIS — Q6689 Other  specified congenital deformities of feet: Secondary | ICD-10-CM | POA: Diagnosis not present

## 2017-04-29 ENCOUNTER — Other Ambulatory Visit: Payer: Self-pay | Admitting: Family Medicine

## 2017-04-29 DIAGNOSIS — R928 Other abnormal and inconclusive findings on diagnostic imaging of breast: Secondary | ICD-10-CM

## 2017-04-29 DIAGNOSIS — R921 Mammographic calcification found on diagnostic imaging of breast: Secondary | ICD-10-CM

## 2017-05-10 ENCOUNTER — Ambulatory Visit
Admission: RE | Admit: 2017-05-10 | Discharge: 2017-05-10 | Disposition: A | Discharge status: Home / Self Care | Payer: BLUE CROSS/BLUE SHIELD | Source: Ambulatory Visit | Attending: Family Medicine | Admitting: Family Medicine

## 2017-05-10 DIAGNOSIS — R928 Other abnormal and inconclusive findings on diagnostic imaging of breast: Secondary | ICD-10-CM | POA: Diagnosis not present

## 2017-05-10 DIAGNOSIS — R921 Mammographic calcification found on diagnostic imaging of breast: Secondary | ICD-10-CM | POA: Insufficient documentation

## 2017-05-10 DIAGNOSIS — R922 Inconclusive mammogram: Secondary | ICD-10-CM | POA: Diagnosis not present

## 2017-08-11 ENCOUNTER — Encounter: Payer: Self-pay | Admitting: *Deleted

## 2017-08-11 ENCOUNTER — Ambulatory Visit
Admission: EM | Admit: 2017-08-11 | Discharge: 2017-08-11 | Disposition: A | Discharge status: Home / Self Care | Payer: BLUE CROSS/BLUE SHIELD | Attending: Family Medicine | Admitting: Family Medicine

## 2017-08-11 DIAGNOSIS — J0100 Acute maxillary sinusitis, unspecified: Secondary | ICD-10-CM | POA: Diagnosis not present

## 2017-08-11 DIAGNOSIS — R05 Cough: Secondary | ICD-10-CM | POA: Diagnosis not present

## 2017-08-11 DIAGNOSIS — R059 Cough, unspecified: Secondary | ICD-10-CM

## 2017-08-11 MED ORDER — DOXYCYCLINE HYCLATE 100 MG PO CAPS: 100.0000 mg | ORAL_CAPSULE | Freq: Two times a day (BID) | ORAL | 0 refills | Status: DC

## 2017-08-11 MED ORDER — HYDROCOD POLST-CPM POLST ER 10-8 MG/5ML PO SUER: 5.0000 mL | Freq: Every evening | ORAL | 0 refills | Status: DC | PRN

## 2017-08-11 NOTE — ED Triage Notes (Signed)
C/O a non productive cough for over a week . Pt was prescribed an albuterol inhaler, tessalon pearls, and prednisone, but did not help her get better. Pt saw her provider through a video visit.

## 2017-08-11 NOTE — Discharge Instructions (Addendum)
Take medication as prescribed. Rest. Drink plenty of fluids.  ° °Follow up with your primary care physician this week as needed. Return to Urgent care for new or worsening concerns.  ° °

## 2017-08-11 NOTE — ED Provider Notes (Signed)
[facility] ____________________________________________  Time seen: Approximately 4:30 PM  I have reviewed the triage vital signs and the nursing notes.   HISTORY  Chief Complaint Cough   HPI Kirsten Moore is a 62 y.o. female presenting for evaluation of just over 1 week of cough, congestion and nasal drainage.  Patient reports the last 2 days also developing sinus pressure sensation.  States cough is been her biggest complaint.  Reports cough has been interrupting sleep.  States 2 days in the sickness she did call 1 week ago to an ED visit in which she received prednisone for a few days, albuterol inhaler as well as Lawyer.  States that she took medication as prescribed, and states that she does not like Lawyer is changing much nor albuterol inhaler.  States cough is intermittently productive, but also intermittently dry hacking cough.  States having more sinus pressure sensation around cheeks, with sinus drainage.  Denies known d continues to drink fluids well, slight decrease in appetite.  States some throat irritation but only from coughing.  Has continue to remain active.  Denies efinite fevers.  Other aggravating or alleviating factors.  Denies known direct sick contacts.  States occasional  seasonal allergies but not regularly.  Former smoker not current.  Denies asthma, COPD, but does report history of bronchitis. Denies chest pain, shortness of breath, extremity pain, extremity swelling or rash. Denies recent sickness. Denies recent antibiotic use.   [name], MD: PCP   Past Medical History:  Diagnosis Date  . Hypertension     Patient Active Problem List   Diagnosis Date Noted  . AVNRT (AV nodal re-entry tachycardia) (HCC) 09/03/2015  . Osteoporosis 01/22/2015    Past Surgical History:  Procedure Laterality Date  . BUNIONECTOMY Left   . CARDIAC ELECTROPHYSIOLOGY MAPPING AND ABLATION    . CARDIAC ELECTROPHYSIOLOGY STUDY AND  ABLATION    . COLONOSCOPY  2011   cleared for 10 yrs- [employer] [facility]  . VAGINAL HYSTERECTOMY     partial     No current facility-administered medications for this encounter.   Current Outpatient Medications:  .  guaifenesin (ROBITUSSIN) 100 MG/5ML syrup, Take 200 mg by mouth 3 (three) times daily as needed for cough., Disp: , Rfl:  .  chlorpheniramine-HYDROcodone (TUSSIONEX PENNKINETIC ER) 10-8 MG/5ML SUER, Take 5 mLs by mouth at bedtime as needed for cough. do not drive or operate machinery while taking as can cause drowsiness., Disp: 50 mL, Rfl: 0 .  doxycycline (VIBRAMYCIN) 100 MG capsule, Take 1 capsule (100 mg total) by mouth 2 (two) times daily., Disp: 20 capsule, Rfl: 0  Allergies Sulfa antibiotics  Family History  Problem Relation Age of Onset  . Breast cancer Other 40  . Breast cancer Mother 59  . Breast cancer Sister 38    Social History Social History   Tobacco Use  . Smoking status: Former Games developer  . Smokeless tobacco: Never Used  Substance Use Topics  . Alcohol use: No    Alcohol/week: 0.0 oz  . Drug use: No    Review of Systems Constitutional: No fever/chills ENT: AS above.  Cardiovascular: Denies chest pain. Respiratory: Denies shortness of breath. Gastrointestinal: No abdominal pain.   Musculoskeletal: Negative for back pain. Skin: Negative for rash.   ____________________________________________   PHYSICAL EXAM:  VITAL SIGNS: ED Triage Vitals  Enc Vitals Group     BP 08/11/17 1444 (!) 143/90     Pulse Rate 08/11/17 1444 89  Resp -- 18     Temp 08/11/17 1444 99 F (37.2 C)     Temp Source 08/11/17 1444 Oral     SpO2 08/11/17 1444 97 %     Weight 08/11/17 1442 140 lb (63.5 kg)     Height 08/11/17 1442 5' (1.524 m)     Head Circumference --      Peak Flow --      Pain Score 08/11/17 1442 0     Pain Loc --      Pain Edu? --      Excl. in GC? --    Constitutional: Alert and oriented. Well appearing and in no acute distress. Eyes:  Conjunctivae are normal. Head: Atraumatic.Mild to moderate tenderness to palpation bilateral  maxillary sinuses.  No frontal sinus tenderness palpation.  No swelling. No erythema.   Ears: no erythema, normal TMs bilaterally.   Nose: nasal congestion with bilateral nasal turbinate erythema and edema.   Mouth/Throat: Mucous membranes are moist.  Oropharynx non-erythematous.No tonsillar swelling or exudate.  Neck: No stridor.  No cervical spine tenderness to palpation. Hematological/Lymphatic/Immunilogical: No cervical lymphadenopathy. Cardiovascular: Normal rate, regular rhythm. Grossly normal heart sounds.  Good peripheral circulation. Respiratory: Normal respiratory effort.  No retractions. No wheezes, rales or rhonchi. Good air movement.  Dry intermittent cough noted in room. Musculoskeletal: No lower extremity tenderness nor edema. No cervical, thoracic or lumbar tenderness to palpation.  Neurologic:  Normal speech and language.No gait instability. Skin:  Skin is warm, dry and intact. No rash noted. Psychiatric: Mood and affect are normal. Speech and behavior are normal.  ___________________________________________   LABS (all labs ordered are listed, but only abnormal results are displayed)  Labs Reviewed - No data to display  PROCEDURES Procedures   INITIAL IMPRESSION / ASSESSMENT AND PLAN / ED COURSE  Pertinent labs & imaging results that were available during my care of the patient were reviewed by me and considered in my medical decision making (see chart for details).  Well-appearing patient.  No acute distress.  Suspect recent viral upper respiratory infection, suspect secondary sinusitis and bronchitis.  Continue home albuterol use as well as PRN Tessalon Perles.  Will Rx oral doxycycline and PRN Tussionex.  Encourage rest, fluids, supportive care and over-the-counter antihistamine.  Work note given for tomorrow as needed.Discussed indication, risks and benefits of medications  with patient.  Discussed follow up with Primary care physician this week. Discussed follow up and return parameters including no resolution or any worsening concerns. Patient verbalized understanding and agreed to plan.   ____________________________________________   FINAL CLINICAL IMPRESSION(S) / ED DIAGNOSES  Final diagnoses:  Cough  Acute maxillary sinusitis, recurrence not specified     ED Discharge Orders        Ordered    doxycycline (VIBRAMYCIN) 100 MG capsule  2 times daily     08/11/17 1533    chlorpheniramine-HYDROcodone (TUSSIONEX PENNKINETIC ER) 10-8 MG/5ML SUER  At bedtime PRN     08/11/17 1533       Note: This dictation was prepared with Dragon dictation along with smaller phrase technology. Any transcriptional errors that result from this process are unintentional.         Renford Dills, NP 08/11/17 1635

## 2017-09-29 ENCOUNTER — Ambulatory Visit (INDEPENDENT_AMBULATORY_CARE_PROVIDER_SITE_OTHER): Payer: BLUE CROSS/BLUE SHIELD | Admitting: Family Medicine

## 2017-09-29 ENCOUNTER — Encounter: Payer: Self-pay | Admitting: Family Medicine

## 2017-09-29 VITALS — BP 124/70 | HR 64 | Ht 60.0 in | Wt 142.0 lb

## 2017-09-29 DIAGNOSIS — N8110 Cystocele, unspecified: Secondary | ICD-10-CM

## 2017-09-29 DIAGNOSIS — N393 Stress incontinence (female) (male): Secondary | ICD-10-CM | POA: Diagnosis not present

## 2017-09-29 NOTE — Progress Notes (Signed)
Name: Kirsten Moore   MRN: [id number]    DOB: 12-Jun-1955   Date:09/29/2017       Progress Note  Subjective  Chief Complaint  Chief Complaint  Patient presents with  . prolapsed bladder    can feel the irritation in her vagina. Had a hysterectomy approx 35 years ago    Female GU Problem  The patient's pertinent negatives include no genital itching, genital lesions, genital odor, genital rash, missed menses, pelvic pain, vaginal bleeding or vaginal discharge. Primary symptoms comment: urinary incont after urination and standing up.. This is a new problem. The current episode started 1 to 4 weeks ago (feeling for 4 wks/positional urinary incont 1 year). The problem occurs daily. The problem has been gradually worsening. The patient is experiencing no pain. Associated symptoms include urgency. Pertinent negatives include no abdominal pain, back pain, chills, constipation, diarrhea, discolored urine, dysuria, fever, flank pain, frequency, headaches, hematuria, joint pain, joint swelling, nausea, painful intercourse, rash, sore throat or vomiting. Her past medical history is significant for a gynecological surgery. (Hysterectomy/endometriosis)    No problem-specific Assessment & Plan notes found for this encounter.   Past Medical History:  Diagnosis Date  . Hypertension     Past Surgical History:  Procedure Laterality Date  . BUNIONECTOMY Left   . CARDIAC ELECTROPHYSIOLOGY MAPPING AND ABLATION    . CARDIAC ELECTROPHYSIOLOGY STUDY AND ABLATION    . COLONOSCOPY  2011   cleared for 10 yrs- [employer]  . VAGINAL HYSTERECTOMY     partial    Family History  Problem Relation Age of Onset  . Breast cancer Other 40  . Breast cancer Mother 26  . Breast cancer Sister 72    Social History   Socioeconomic History  . Marital status: Married    Spouse name: Not on file  . Number of children: Not on file  . Years of education: Not on file  . Highest education level: Not on file   Occupational History  . Not on file  Social Needs  . Financial resource strain: Not on file  . Food insecurity:    Worry: Not on file    Inability: Not on file  . Transportation needs:    Medical: Not on file    Non-medical: Not on file  Tobacco Use  . Smoking status: Former Games developer  . Smokeless tobacco: Never Used  Substance and Sexual Activity  . Alcohol use: No    Alcohol/week: 0.0 oz  . Drug use: No  . Sexual activity: Not Currently  Lifestyle  . Physical activity:    Days per week: Not on file    Minutes per session: Not on file  . Stress: Not on file  Relationships  . Social connections:    Talks on phone: Not on file    Gets together: Not on file    Attends religious service: Not on file    Active member of club or organization: Not on file    Attends meetings of clubs or organizations: Not on file    Relationship status: Not on file  . Intimate partner violence:    Fear of current or ex partner: Not on file    Emotionally abused: Not on file    Physically abused: Not on file    Forced sexual activity: Not on file  Other Topics Concern  . Not on file  Social History Narrative  . Not on file    Allergies  Allergen Reactions  . Sulfa Antibiotics  Nausea And Vomiting    Outpatient Medications Prior to Visit  Medication Sig Dispense Refill  . chlorpheniramine-HYDROcodone (TUSSIONEX PENNKINETIC ER) 10-8 MG/5ML SUER Take 5 mLs by mouth at bedtime as needed for cough. do not drive or operate machinery while taking as can cause drowsiness. 50 mL 0  . doxycycline (VIBRAMYCIN) 100 MG capsule Take 1 capsule (100 mg total) by mouth 2 (two) times daily. 20 capsule 0  . guaifenesin (ROBITUSSIN) 100 MG/5ML syrup Take 200 mg by mouth 3 (three) times daily as needed for cough.     No facility-administered medications prior to visit.     Review of Systems  Constitutional: Negative for chills, fever, malaise/fatigue and weight loss.  HENT: Negative for ear discharge, ear  pain and sore throat.   Eyes: Negative for blurred vision.  Respiratory: Negative for cough, sputum production, shortness of breath and wheezing.   Cardiovascular: Negative for chest pain, palpitations and leg swelling.  Gastrointestinal: Negative for abdominal pain, blood in stool, constipation, diarrhea, heartburn, melena, nausea and vomiting.  Genitourinary: Positive for urgency. Negative for dysuria, flank pain, frequency, hematuria, missed menses, pelvic pain and vaginal discharge.  Musculoskeletal: Negative for back pain, joint pain, myalgias and neck pain.  Skin: Negative for rash.  Neurological: Negative for dizziness, tingling, sensory change, focal weakness and headaches.  Endo/Heme/Allergies: Negative for environmental allergies and polydipsia. Does not bruise/bleed easily.  Psychiatric/Behavioral: Negative for depression and suicidal ideas. The patient is not nervous/anxious and does not have insomnia.      Objective  Vitals:   09/29/17 1017  BP: 124/70  Pulse: 64  Weight: 142 lb (64.4 kg)  Height: 5' (1.524 m)    Physical Exam  Constitutional: No distress.  HENT:  Head: Normocephalic and atraumatic.  Right Ear: External ear normal.  Left Ear: External ear normal.  Nose: Nose normal.  Mouth/Throat: Oropharynx is clear and moist.  Eyes: Pupils are equal, round, and reactive to light. Conjunctivae and EOM are normal. Right eye exhibits no discharge. Left eye exhibits no discharge. No scleral icterus.  Neck: Normal range of motion. Neck supple. No JVD present. No thyromegaly present.  Cardiovascular: Normal rate, regular rhythm, normal heart sounds and intact distal pulses. Exam reveals no gallop and no friction rub.  No murmur heard. Pulmonary/Chest: Effort normal and breath sounds normal. She has no wheezes. She has no rales. She exhibits no tenderness.  Abdominal: Soft. Bowel sounds are normal. She exhibits no mass. There is no tenderness. There is no guarding.   Genitourinary: Vagina normal. Pelvic exam was performed with patient supine. There is no rash, tenderness, lesion or injury on the right labia. There is no rash, tenderness, lesion or injury on the left labia. No erythema, tenderness or bleeding in the vagina. No foreign body in the vagina. No signs of injury around the vagina. No vaginal discharge found.  Musculoskeletal: Normal range of motion. She exhibits no edema, tenderness or deformity.  Lymphadenopathy:    She has no cervical adenopathy.  Neurological: She is alert. She has normal reflexes.  Skin: Skin is warm and dry. She is not diaphoretic.  Nursing note and vitals reviewed.     Assessment & Plan  Problem List Items Addressed This Visit    None    Visit Diagnoses    SUI (stress urinary incontinence, female)    -  Primary   Postural incontinence after standing. Referral to gyn for evaluation.   Relevant Orders   Ambulatory referral to Obstetrics / Gynecology  Female cystocele       Exam with valsava c/w with cystocele and will referr to GYN   Relevant Orders   Ambulatory referral to Obstetrics / Gynecology      No orders of the defined types were placed in this encounter.     Dr. Hayden Rasmusseneanna Jones [facility]  09/29/17

## 2017-10-13 ENCOUNTER — Encounter: Payer: Self-pay | Admitting: Obstetrics & Gynecology

## 2017-11-11 ENCOUNTER — Telehealth: Payer: Self-pay

## 2017-11-11 NOTE — Telephone Encounter (Signed)
Needs Mammo ordered for 6 mo fu Mammo [place]

## 2017-11-15 ENCOUNTER — Other Ambulatory Visit: Payer: Self-pay

## 2017-11-15 DIAGNOSIS — R921 Mammographic calcification found on diagnostic imaging of breast: Secondary | ICD-10-CM

## 2017-11-16 DIAGNOSIS — N810 Urethrocele: Secondary | ICD-10-CM | POA: Diagnosis not present

## 2017-11-16 DIAGNOSIS — N3941 Urge incontinence: Secondary | ICD-10-CM | POA: Diagnosis not present

## 2017-11-21 ENCOUNTER — Encounter: Payer: Self-pay | Admitting: Family Medicine

## 2017-11-26 ENCOUNTER — Other Ambulatory Visit: Payer: BLUE CROSS/BLUE SHIELD

## 2017-12-07 ENCOUNTER — Ambulatory Visit
Admission: RE | Admit: 2017-12-07 | Discharge: 2017-12-07 | Disposition: A | Discharge status: Home / Self Care | Payer: BLUE CROSS/BLUE SHIELD | Source: Ambulatory Visit | Attending: Family Medicine | Admitting: Family Medicine

## 2017-12-07 DIAGNOSIS — R921 Mammographic calcification found on diagnostic imaging of breast: Secondary | ICD-10-CM

## 2017-12-07 DIAGNOSIS — R920 Mammographic microcalcification found on diagnostic imaging of breast: Secondary | ICD-10-CM | POA: Diagnosis not present

## 2018-04-04 ENCOUNTER — Ambulatory Visit (INDEPENDENT_AMBULATORY_CARE_PROVIDER_SITE_OTHER): Payer: BLUE CROSS/BLUE SHIELD | Admitting: Family Medicine

## 2018-04-04 ENCOUNTER — Encounter: Payer: Self-pay | Admitting: Family Medicine

## 2018-04-04 VITALS — BP 120/62 | HR 72 | Ht 60.0 in | Wt 146.0 lb

## 2018-04-04 DIAGNOSIS — Z23 Encounter for immunization: Secondary | ICD-10-CM | POA: Diagnosis not present

## 2018-04-04 DIAGNOSIS — R928 Other abnormal and inconclusive findings on diagnostic imaging of breast: Secondary | ICD-10-CM

## 2018-04-04 DIAGNOSIS — E7801 Familial hypercholesterolemia: Secondary | ICD-10-CM

## 2018-04-04 DIAGNOSIS — Z0000 Encounter for general adult medical examination without abnormal findings: Secondary | ICD-10-CM

## 2018-04-04 NOTE — Progress Notes (Signed)
Date:  04/04/2018   Name:  Kirsten NeighborBetsy Jones Moore   DOB:  1955-05-13   MRN:  [id number]   Chief Complaint: Annual Exam (no pap, needs diag bil mammo) and Flu Vaccine  Patient is a 63 year old female who presents for a comprehensive physical exam. The patient reports the following problems: follow up mammogram. Health maintenance has been reviewed mammogram.   Review of Systems  Constitutional: Negative.  Negative for chills, fatigue, fever and unexpected weight change.  HENT: Negative for congestion, ear discharge, ear pain, rhinorrhea, sinus pressure, sneezing and sore throat.   Eyes: Negative for photophobia, pain, discharge, redness and itching.  Respiratory: Negative for cough, shortness of breath, wheezing and stridor.   Gastrointestinal: Negative for abdominal pain, blood in stool, constipation, diarrhea, nausea and vomiting.  Endocrine: Negative for cold intolerance, heat intolerance, polydipsia, polyphagia and polyuria.  Genitourinary: Negative for dysuria, flank pain, frequency, hematuria, menstrual problem, pelvic pain, urgency, vaginal bleeding and vaginal discharge.  Musculoskeletal: Negative for arthralgias, back pain and myalgias.  Skin: Negative for rash.  Allergic/Immunologic: Negative for environmental allergies and food allergies.  Neurological: Positive for headaches. Negative for dizziness, weakness, light-headedness and numbness.       Sinus headache 3 weeks  Hematological: Negative for adenopathy. Does not bruise/bleed easily.  Psychiatric/Behavioral: Negative for dysphoric mood. The patient is not nervous/anxious.     Patient Active Problem List   Diagnosis Date Noted  . AVNRT (AV nodal re-entry tachycardia) (HCC) 09/03/2015  . Osteoporosis 01/22/2015    Allergies  Allergen Reactions  . Sulfa Antibiotics Nausea And Vomiting    Past Surgical History:  Procedure Laterality Date  . BUNIONECTOMY Left   . CARDIAC ELECTROPHYSIOLOGY MAPPING AND ABLATION    .  CARDIAC ELECTROPHYSIOLOGY STUDY AND ABLATION    . COLONOSCOPY  2011   [facility] yrs[facility]  . VAGINAL HYSTERECTOMY     partial    Social History   Tobacco Use  . Smoking status: Former Games developermoker  . Smokeless tobacco: Never Used  Substance Use Topics  . Alcohol use: No    Alcohol/week: 0.0 standard drinks  . Drug use: No     Medication list has been reviewed and updated.  No outpatient medications have been marked as taking for the 04/04/18 encounter (Office Visit) with Duanne LimerickJones, Deanna C, MD.    [facility] 03/31/2017 03/31/2017 09/03/2015 03/12/2015  PHQ - 2 Score 0 0 0 0  PHQ- 9 Score 0 - - -    Physical Exam Vitals signs and nursing note reviewed.  Constitutional:      General: She is not in acute distress.    Appearance: Normal appearance. She is well-groomed. She is not diaphoretic.  HENT:     Head: Normocephalic and atraumatic.     Right Ear: Hearing, tympanic membrane, ear canal and external ear normal.     Left Ear: Hearing, tympanic membrane, ear canal and external ear normal.     Nose: Nose normal. No nasal deformity.     Right Turbinates: Not swollen.     Left Turbinates: Not swollen.     Mouth/Throat:     Lips: Pink.     Mouth: Mucous membranes are moist. Mucous membranes are pale.     Dentition: Normal dentition.     Pharynx: Oropharynx is clear. Uvula midline. No pharyngeal swelling, oropharyngeal exudate, posterior oropharyngeal erythema or uvula swelling.  Eyes:     General: Lids are normal.  Right eye: No discharge.        Left eye: No discharge.     Extraocular Movements: Extraocular movements intact.     Conjunctiva/sclera: Conjunctivae normal.     Pupils: Pupils are equal, round, and reactive to light.     Funduscopic exam:    Right eye: No AV nicking or papilledema. Red reflex present.        Left eye: No AV nicking or papilledema. Red reflex present. Neck:     Musculoskeletal: Full passive range of motion without pain, normal range of  motion and neck supple.     Thyroid: No thyroid mass, thyromegaly or thyroid tenderness.     Vascular: No JVD.  Cardiovascular:     Rate and Rhythm: Normal rate and regular rhythm.     Chest Wall: PMI is not displaced.     Pulses: Normal pulses.          Carotid pulses are 2+ on the right side and 2+ on the left side.      Radial pulses are 2+ on the right side and 2+ on the left side.       Femoral pulses are 2+ on the right side and 2+ on the left side.      Popliteal pulses are 2+ on the right side and 2+ on the left side.       Dorsalis pedis pulses are 2+ on the right side and 2+ on the left side.       Posterior tibial pulses are 2+ on the right side and 2+ on the left side.     Heart sounds: Normal heart sounds, S1 normal and S2 normal. No murmur. No systolic murmur. No diastolic murmur. No friction rub. No gallop. No S3 or S4 sounds.   Pulmonary:     Effort: Pulmonary effort is normal.     Breath sounds: Normal breath sounds. No decreased breath sounds, wheezing, rhonchi or rales.  Chest:     Chest wall: No mass or tenderness.     Breasts: Breasts are symmetrical.        Right: Normal. No swelling, bleeding, inverted nipple, mass, nipple discharge or skin change.        Left: Normal. No swelling, bleeding, inverted nipple, mass, nipple discharge or skin change.  Abdominal:     General: Bowel sounds are normal.     Palpations: Abdomen is soft. There is no shifting dullness, hepatomegaly, splenomegaly or mass.     Tenderness: There is no abdominal tenderness. There is no right CVA tenderness, left CVA tenderness or guarding.  Musculoskeletal: Normal range of motion.     Right lower leg: No edema.     Left lower leg: No edema.  Lymphadenopathy:     Head:     Right side of head: No submandibular adenopathy.     Left side of head: No submandibular adenopathy.     Cervical: No cervical adenopathy.     Right cervical: No superficial or posterior cervical adenopathy.    Left  cervical: No superficial or posterior cervical adenopathy.     Upper Body:     Right upper body: No axillary adenopathy.     Left upper body: No axillary adenopathy.  Skin:    General: Skin is warm and dry.     Capillary Refill: Capillary refill takes less than 2 seconds.  Neurological:     Mental Status: She is alert.     Cranial Nerves: Cranial nerves are intact.  Sensory: Sensation is intact.     Motor: Motor function is intact.     Deep Tendon Reflexes: Reflexes are normal and symmetric.     Reflex Scores:      Tricep reflexes are 2+ on the right side and 2+ on the left side.      Bicep reflexes are 2+ on the right side and 2+ on the left side.      Brachioradialis reflexes are 2+ on the right side and 2+ on the left side.      Patellar reflexes are 2+ on the right side and 2+ on the left side.      Achilles reflexes are 2+ on the right side and 2+ on the left side. Psychiatric:        Behavior: Behavior is cooperative.     BP 120/62   Pulse 72   Ht 5' (1.524 m)   Wt 146 lb (66.2 kg)   BMI 28.51 kg/m   Assessment and Plan: 1. Abnormal mammogram of right breast Patient with history of abnormal mammogram of the right breast mammogram was rescheduled January 31 at 10:20 AM. - MM DIAG BREAST TOMO BILATERAL; Future  2. Annual physical exam No subjective nor objective concerns noted during history or physical exam. - Renal Function Panel  3. Familial hypercholesterolemia Patient with history of elevated cholesterol.  Will check lipid panel. - Lipid panel  4. Influenza vaccine needed Discussed and administered - Flu Vaccine QUAD 36+ mos IM

## 2018-04-04 NOTE — Patient Instructions (Signed)
Mediterranean Diet  A Mediterranean diet refers to food and lifestyle choices that are based on the traditions of countries located on the [place]. This way of eating has been shown to help prevent certain conditions and improve outcomes for people who have chronic diseases, like kidney disease and heart disease.  What are tips for following this plan?  Lifestyle   Cook and eat meals together with your family, when possible.   Drink enough fluid to keep your urine clear or pale yellow.   Be physically active every day. This includes:  ? Aerobic exercise like running or swimming.  ? Leisure activities like gardening, walking, or housework.   Get 7-8 hours of sleep each night.   If recommended by your health care provider, drink red wine in moderation. This means 1 glass a day for nonpregnant women and 2 glasses a day for men. A glass of wine equals 5 oz (150 mL).  Reading food labels     Check the serving size of packaged foods. For foods such as rice and pasta, the serving size refers to the amount of cooked product, not dry.   Check the total fat in packaged foods. Avoid foods that have saturated fat or trans fats.   Check the ingredients list for added sugars, such as corn syrup.  Shopping   At the grocery store, buy most of your food from the areas near the walls of the store. This includes:  ? Fresh fruits and vegetables (produce).  ? Grains, beans, nuts, and seeds. Some of these may be available in unpackaged forms or large amounts (in bulk).  ? Fresh seafood.  ? Poultry and eggs.  ? Low-fat dairy products.   Buy whole ingredients instead of prepackaged foods.   Buy fresh fruits and vegetables in-season from local farmers markets.   Buy frozen fruits and vegetables in resealable bags.   If you do not have access to quality fresh seafood, buy precooked frozen shrimp or canned fish, such as tuna, salmon, or sardines.   Buy small amounts of raw or cooked vegetables, salads, or olives from  the deli or salad bar at your store.   Stock your pantry so you always have certain foods on hand, such as olive oil, canned tuna, canned tomatoes, rice, pasta, and beans.  Cooking   Cook foods with extra-virgin olive oil instead of using butter or other vegetable oils.   Have meat as a side dish, and have vegetables or grains as your main dish. This means having meat in small portions or adding small amounts of meat to foods like pasta or stew.   Use beans or vegetables instead of meat in common dishes like chili or lasagna.   Experiment with different cooking methods. Try roasting or broiling vegetables instead of steaming or sauteing them.   Add frozen vegetables to soups, stews, pasta, or rice.   Add nuts or seeds for added healthy fat at each meal. You can add these to yogurt, salads, or vegetable dishes.   Marinate fish or vegetables using olive oil, lemon juice, garlic, and fresh herbs.  Meal planning     Plan to eat 1 vegetarian meal one day each week. Try to work up to 2 vegetarian meals, if possible.   Eat seafood 2 or more times a week.   Have healthy snacks readily available, such as:  ? Vegetable sticks with hummus.  ? Greek yogurt.  ? Fruit and nut trail mix.   Eat balanced   meals throughout the week. This includes:  ? Fruit: 2-3 servings a day  ? Vegetables: 4-5 servings a day  ? Low-fat dairy: 2 servings a day  ? Fish, poultry, or lean meat: 1 serving a day  ? Beans and legumes: 2 or more servings a week  ? Nuts and seeds: 1-2 servings a day  ? Whole grains: 6-8 servings a day  ? Extra-virgin olive oil: 3-4 servings a day   Limit red meat and sweets to only a few servings a month  What are my food choices?   Mediterranean diet  ? Recommended  ? Grains: Whole-grain pasta. Brown rice. Bulgar wheat. Polenta. Couscous. Whole-wheat bread. Oatmeal. Quinoa.  ? Vegetables: Artichokes. Beets. Broccoli. Cabbage. Carrots. Eggplant. Green beans. Chard. Kale. Spinach. Onions. Leeks. Peas. Squash.  Tomatoes. Peppers. Radishes.  ? Fruits: Apples. Apricots. Avocado. Berries. Bananas. Cherries. Dates. Figs. Grapes. Lemons. Melon. Oranges. Peaches. Plums. Pomegranate.  ? Meats and other protein foods: Beans. Almonds. Sunflower seeds. Pine nuts. Peanuts. Cod. Salmon. Scallops. Shrimp. Tuna. Tilapia. Clams. Oysters. Eggs.  ? Dairy: Low-fat milk. Cheese. Greek yogurt.  ? Beverages: Water. Red wine. Herbal tea.  ? Fats and oils: Extra virgin olive oil. Avocado oil. Grape seed oil.  ? Sweets and desserts: Greek yogurt with honey. Baked apples. Poached pears. Trail mix.  ? Seasoning and other foods: Basil. Cilantro. Coriander. Cumin. Mint. Parsley. Sage. Rosemary. Tarragon. Garlic. Oregano. Thyme. Pepper. Balsalmic vinegar. Tahini. Hummus. Tomato sauce. Olives. Mushrooms.  ? Limit these  ? Grains: Prepackaged pasta or rice dishes. Prepackaged cereal with added sugar.  ? Vegetables: Deep fried potatoes (french fries).  ? Fruits: Fruit canned in syrup.  ? Meats and other protein foods: Beef. Pork. Lamb. Poultry with skin. Hot dogs. Bacon.  ? Dairy: Ice cream. Sour cream. Whole milk.  ? Beverages: Juice. Sugar-sweetened soft drinks. Beer. Liquor and spirits.  ? Fats and oils: Butter. Canola oil. Vegetable oil. Beef fat (tallow). Lard.  ? Sweets and desserts: Cookies. Cakes. Pies. Candy.  ? Seasoning and other foods: Mayonnaise. Premade sauces and marinades.  ? The items listed may not be a complete list. Talk with your dietitian about what dietary choices are right for you.  Summary   The Mediterranean diet includes both food and lifestyle choices.   Eat a variety of fresh fruits and vegetables, beans, nuts, seeds, and whole grains.   Limit the amount of red meat and sweets that you eat.   Talk with your health care provider about whether it is safe for you to drink red wine in moderation. This means 1 glass a day for nonpregnant women and 2 glasses a day for men. A glass of wine equals 5 oz (150 mL).  This information  is not intended to replace advice given to you by your health care provider. Make sure you discuss any questions you have with your health care provider.  Document Released: 11/07/2015 Document Revised: 12/10/2015 Document Reviewed: 11/07/2015  Elsevier Interactive Patient Education  2019 [employer]

## 2018-04-05 LAB — LIPID PANEL
CHOL/HDL RATIO: 3.5 ratio (ref 0.0–4.4)
CHOLESTEROL TOTAL: 270 mg/dL — AB (ref 100–199)
HDL: 78 mg/dL (ref >39)
LDL Calculated: 167 mg/dL — ABNORMAL HIGH (ref 0–99)
TRIGLYCERIDES: 125 mg/dL (ref 0–149)
VLDL Cholesterol Cal: 25 mg/dL (ref 5–40)

## 2018-04-05 LAB — RENAL FUNCTION PANEL
ALBUMIN: 4.4 g/dL (ref 3.6–4.8)
BUN/Creatinine Ratio: 19 (ref 12–28)
BUN: 14 mg/dL (ref 8–27)
CALCIUM: 9.5 mg/dL (ref 8.7–10.3)
CO2: 23 mmol/L (ref 20–29)
CREATININE: 0.75 mg/dL (ref 0.57–1.00)
Chloride: 102 mmol/L (ref 96–106)
GFR calc Af Amer: 97 mL/min/{1.73_m2} (ref >59)
GFR, EST NON AFRICAN AMERICAN: 85 mL/min/{1.73_m2} (ref >59)
Glucose: 83 mg/dL (ref 65–99)
PHOSPHORUS: 3.6 mg/dL (ref 2.5–4.5)
Potassium: 4.3 mmol/L (ref 3.5–5.2)
SODIUM: 138 mmol/L (ref 134–144)

## 2018-05-02 ENCOUNTER — Ambulatory Visit
Admission: RE | Admit: 2018-05-02 | Discharge: 2018-05-02 | Disposition: A | Discharge status: Home / Self Care | Payer: BLUE CROSS/BLUE SHIELD | Source: Ambulatory Visit | Attending: Family Medicine | Admitting: Family Medicine

## 2018-05-02 DIAGNOSIS — R928 Other abnormal and inconclusive findings on diagnostic imaging of breast: Secondary | ICD-10-CM | POA: Diagnosis present

## 2018-08-01 ENCOUNTER — Other Ambulatory Visit: Payer: Self-pay

## 2018-08-01 ENCOUNTER — Ambulatory Visit (INDEPENDENT_AMBULATORY_CARE_PROVIDER_SITE_OTHER): Payer: BLUE CROSS/BLUE SHIELD | Admitting: Family Medicine

## 2018-08-01 ENCOUNTER — Encounter: Payer: Self-pay | Admitting: Family Medicine

## 2018-08-01 VITALS — BP 110/64 | HR 60 | Ht 60.0 in | Wt 147.0 lb

## 2018-08-01 DIAGNOSIS — L0100 Impetigo, unspecified: Secondary | ICD-10-CM

## 2018-08-01 DIAGNOSIS — J0100 Acute maxillary sinusitis, unspecified: Secondary | ICD-10-CM

## 2018-08-01 MED ORDER — AMOXICILLIN-POT CLAVULANATE 250-62.5 MG/5ML PO SUSR: 500.0000 mg | Freq: Three times a day (TID) | ORAL | 0 refills | Status: DC

## 2018-08-01 NOTE — Progress Notes (Signed)
Date:  08/01/2018   Name:  Kirsten Moore   DOB:  1955-12-24   MRN:  [id number]   Chief Complaint: Sinusitis (no travel, no fever, no exposure- has a sore in nose, has sinus pressure and head is sore to touch on left side. cough from tickle in throat- no production)  Sinusitis  This is a new problem. The current episode started more than 1 month ago (Jan/Feb). The problem has been gradually worsening since onset. There has been no fever. The pain is mild. Associated symptoms include congestion, headaches and sinus pressure. Pertinent negatives include no chills, coughing, diaphoresis, ear pain, hoarse voice, neck pain, shortness of breath, sneezing, sore throat or swollen glands. (Scalp tenderness/left side) Treatments tried: corcedin. The treatment provided no relief.    Review of Systems  Constitutional: Negative.  Negative for chills, diaphoresis, fatigue, fever and unexpected weight change.  HENT: Positive for congestion and sinus pressure. Negative for ear discharge, ear pain, hoarse voice, rhinorrhea, sneezing and sore throat.   Eyes: Negative for photophobia, pain, discharge, redness and itching.  Respiratory: Negative for cough, shortness of breath, wheezing and stridor.   Gastrointestinal: Negative for abdominal pain, blood in stool, constipation, diarrhea, nausea and vomiting.  Endocrine: Negative for cold intolerance, heat intolerance, polydipsia, polyphagia and polyuria.  Genitourinary: Negative for dysuria, flank pain, frequency, hematuria, menstrual problem, pelvic pain, urgency, vaginal bleeding and vaginal discharge.  Musculoskeletal: Negative for arthralgias, back pain, myalgias and neck pain.  Skin: Negative for rash.  Allergic/Immunologic: Negative for environmental allergies and food allergies.  Neurological: Positive for headaches. Negative for dizziness, weakness, light-headedness and numbness.  Hematological: Negative for adenopathy. Does not bruise/bleed easily.   Psychiatric/Behavioral: Negative for dysphoric mood. The patient is not nervous/anxious.     Patient Active Problem List   Diagnosis Date Noted  . AVNRT (AV nodal re-entry tachycardia) (HCC) 09/03/2015  . Osteoporosis 01/22/2015    Allergies  Allergen Reactions  . Sulfa Antibiotics Nausea And Vomiting    Past Surgical History:  Procedure Laterality Date  . BUNIONECTOMY Left   . CARDIAC ELECTROPHYSIOLOGY MAPPING AND ABLATION    . CARDIAC ELECTROPHYSIOLOGY STUDY AND ABLATION    . COLONOSCOPY  2011   cleared for 10 yrs- [employer] [facility]  . VAGINAL HYSTERECTOMY     partial    Social History   Tobacco Use  . Smoking status: Former Games developer  . Smokeless tobacco: Never Used  Substance Use Topics  . Alcohol use: No    Alcohol/week: 0.0 standard drinks  . Drug use: No     Medication list has been reviewed and updated.  No outpatient medications have been marked as taking for the 08/01/18 encounter (Office Visit) with [name], MD.    [facility] 2/9 Scores 08/01/2018 03/31/2017 03/31/2017 09/03/2015  PHQ - 2 Score 0 0 0 0  PHQ- 9 Score 0 0 - -    BP Readings from Last 3 Encounters:  08/01/18 110/64  04/04/18 120/62  09/29/17 124/70    Physical Exam Vitals signs and nursing note reviewed.  Constitutional:      General: She is not in acute distress.    Appearance: She is not diaphoretic.  HENT:     Head: Normocephalic and atraumatic.     Right Ear: External ear normal.     Left Ear: External ear normal.     Nose: Nose normal.  Eyes:     General:        Right eye: No discharge.  Left eye: No discharge.     Conjunctiva/sclera: Conjunctivae normal.     Pupils: Pupils are equal, round, and reactive to light.  Neck:     Musculoskeletal: Normal range of motion and neck supple.     Thyroid: No thyromegaly.     Vascular: No JVD.  Cardiovascular:     Rate and Rhythm: Normal rate and regular rhythm.     Heart sounds: Normal heart sounds. No murmur. No friction rub. No gallop.    Pulmonary:     Effort: Pulmonary effort is normal.     Breath sounds: Normal breath sounds. No wheezing or rhonchi.  Abdominal:     General: Bowel sounds are normal.     Palpations: Abdomen is soft. There is no mass.     Tenderness: There is no abdominal tenderness. There is no guarding.  Musculoskeletal: Normal range of motion.  Lymphadenopathy:     Cervical: No cervical adenopathy.  Skin:    General: Skin is warm and dry.  Neurological:     Mental Status: She is alert.     Deep Tendon Reflexes: Reflexes are normal and symmetric.     Wt Readings from Last 3 Encounters:  08/01/18 147 lb (66.7 kg)  04/04/18 146 lb (66.2 kg)  09/29/17 142 lb (64.4 kg)    BP 110/64   Pulse 60   Ht 5' (1.524 m)   Wt 147 lb (66.7 kg)   BMI 28.71 kg/m   Assessment and Plan:  1. Acute maxillary sinusitis, recurrence not specified Acute.  Persistent.  Exam is consistent with an acute maxillary sinusitis.  Patient is unable to swallow large pills therefore we must use suspension.  We will take 2 teaspoons of Augmentin to 50- 62.53 times a day. - amoxicillin-clavulanate (AUGMENTIN) 250-62.5 MG/5ML suspension; Take 10 mLs (500 mg total) by mouth 3 (three) times daily.  Dispense: 300 mL; Refill: 0  2. Impetigo Patient was also noted to have irritation and erythema in the intranasal right nostril as well as redness of the tip of the nose.  This is consistent with impetigo/cellulitis for which we will use the above Augmentin dosing. - amoxicillin-clavulanate (AUGMENTIN) 250-62.5 MG/5ML suspension; Take 10 mLs (500 mg total) by mouth 3 (three) times daily.  Dispense: 300 mL; Refill: 0

## 2019-02-08 ENCOUNTER — Ambulatory Visit (INDEPENDENT_AMBULATORY_CARE_PROVIDER_SITE_OTHER): Payer: BC Managed Care – PPO

## 2019-02-08 ENCOUNTER — Other Ambulatory Visit: Payer: Self-pay

## 2019-02-08 DIAGNOSIS — Z23 Encounter for immunization: Secondary | ICD-10-CM | POA: Diagnosis not present

## 2019-04-10 ENCOUNTER — Encounter: Payer: BLUE CROSS/BLUE SHIELD | Admitting: Family Medicine

## 2019-04-19 ENCOUNTER — Ambulatory Visit (INDEPENDENT_AMBULATORY_CARE_PROVIDER_SITE_OTHER): Payer: Commercial Managed Care - HMO | Admitting: Family Medicine

## 2019-04-19 ENCOUNTER — Other Ambulatory Visit: Payer: Self-pay

## 2019-04-19 ENCOUNTER — Encounter: Payer: Self-pay | Admitting: Family Medicine

## 2019-04-19 VITALS — BP 130/92 | HR 78 | Ht 60.0 in | Wt 145.0 lb

## 2019-04-19 DIAGNOSIS — E7801 Familial hypercholesterolemia: Secondary | ICD-10-CM | POA: Diagnosis not present

## 2019-04-19 DIAGNOSIS — R928 Other abnormal and inconclusive findings on diagnostic imaging of breast: Secondary | ICD-10-CM

## 2019-04-19 DIAGNOSIS — Z0000 Encounter for general adult medical examination without abnormal findings: Secondary | ICD-10-CM

## 2019-04-19 DIAGNOSIS — Z1231 Encounter for screening mammogram for malignant neoplasm of breast: Secondary | ICD-10-CM | POA: Diagnosis not present

## 2019-04-19 DIAGNOSIS — E78019 Familial hypercholesterolemia, unspecified: Secondary | ICD-10-CM

## 2019-04-19 NOTE — Progress Notes (Signed)
Date:  04/19/2019   Name:  Kirsten Moore   DOB:  Jan 28, 1956   MRN:  [id number]   Chief Complaint: Annual Exam (no pap)  Patient is a 64 year old female who presents for a comprehensive physical exam. The patient reports the following problems: none. Health maintenance has been reviewed refuses PAP/hold pneumococcal.   Lab Results  Component Value Date   CREATININE 0.75 04/04/2018   BUN 14 04/04/2018   NA 138 04/04/2018   K 4.3 04/04/2018   CL 102 04/04/2018   CO2 23 04/04/2018   Lab Results  Component Value Date   CHOL 270 (H) 04/04/2018   HDL 78 04/04/2018   LDLCALC 167 (H) 04/04/2018   TRIG 125 04/04/2018   CHOLHDL 3.5 04/04/2018   No results found for: TSH No results found for: HGBA1C   Review of Systems  Constitutional: Negative.  Negative for chills, fatigue, fever and unexpected weight change.  HENT: Negative for congestion, ear discharge, ear pain, mouth sores, rhinorrhea, sinus pressure, sneezing and sore throat.   Eyes: Negative for photophobia, pain, discharge, redness and itching.  Respiratory: Negative for cough, chest tightness, shortness of breath, wheezing and stridor.   Cardiovascular: Negative for chest pain, palpitations and leg swelling.  Gastrointestinal: Negative for abdominal distention, abdominal pain, blood in stool, constipation, diarrhea, nausea and vomiting.  Endocrine: Negative for cold intolerance, heat intolerance, polydipsia, polyphagia and polyuria.  Genitourinary: Negative for dysuria, flank pain, frequency, hematuria, menstrual problem, pelvic pain, urgency, vaginal bleeding and vaginal discharge.  Musculoskeletal: Negative for arthralgias, back pain and myalgias.  Skin: Negative for rash.  Allergic/Immunologic: Negative for environmental allergies and food allergies.  Neurological: Negative for dizziness, weakness, light-headedness, numbness and headaches.  Hematological: Negative for adenopathy. Does not bruise/bleed easily.    Psychiatric/Behavioral: Negative for dysphoric mood. The patient is not nervous/anxious.     Patient Active Problem List   Diagnosis Date Noted  . AVNRT (AV nodal re-entry tachycardia) (HCC) 09/03/2015  . Osteoporosis 01/22/2015    Allergies  Allergen Reactions  . Sulfa Antibiotics Nausea And Vomiting    Past Surgical History:  Procedure Laterality Date  . BUNIONECTOMY Left   . CARDIAC ELECTROPHYSIOLOGY MAPPING AND ABLATION    . CARDIAC ELECTROPHYSIOLOGY STUDY AND ABLATION    . COLONOSCOPY  2011   cleared for 10 yrs- [employer]  . VAGINAL HYSTERECTOMY     partial    Social History   Tobacco Use  . Smoking status: Former Games developer  . Smokeless tobacco: Never Used  Substance Use Topics  . Alcohol use: No    Alcohol/week: 0.0 standard drinks  . Drug use: No     Medication list has been reviewed and updated.  No outpatient medications have been marked as taking for the 04/19/19 encounter (Office Visit) with [name], MD.    [facility] 2/9 Scores 08/01/2018 03/31/2017 03/31/2017 09/03/2015  PHQ - 2 Score 0 0 0 0  PHQ- 9 Score 0 0 - -    BP Readings from Last 3 Encounters:  04/19/19 (!) 130/92  08/01/18 110/64  04/04/18 120/62    Physical Exam Vitals and nursing note reviewed.  Constitutional:      General: She is not in acute distress.    Appearance: Normal appearance. She is well-developed and overweight. She is not diaphoretic.  HENT:     Head: Normocephalic and atraumatic.     Jaw: There is normal jaw occlusion.     Salivary Glands: Right salivary gland  is not diffusely enlarged. Left salivary gland is not diffusely enlarged.     Right Ear: Tympanic membrane, ear canal and external ear normal.     Left Ear: Hearing, tympanic membrane, ear canal and external ear normal.     Nose: Nose normal. No congestion or rhinorrhea.     Mouth/Throat:     Lips: Pink.     Mouth: Mucous membranes are moist.     Dentition: Normal dentition.     Pharynx: Oropharynx is clear. Uvula  midline.     Tonsils: No tonsillar exudate.  Eyes:     General: Lids are normal. Vision grossly intact. Gaze aligned appropriately.        Right eye: No discharge.        Left eye: No discharge.     Extraocular Movements: Extraocular movements intact.     Conjunctiva/sclera: Conjunctivae normal.     Right eye: Right conjunctiva is not injected.     Left eye: Left conjunctiva is not injected.     Pupils: Pupils are equal, round, and reactive to light.     Funduscopic exam:    Right eye: No arteriolar narrowing. Red reflex present.        Left eye: No arteriolar narrowing. Red reflex present. Neck:     Thyroid: No thyroid mass, thyromegaly or thyroid tenderness.     Vascular: Normal carotid pulses. No carotid bruit, hepatojugular reflux or JVD.     Trachea: Trachea and phonation normal.  Cardiovascular:     Rate and Rhythm: Normal rate and regular rhythm.     Chest Wall: PMI is not displaced. No thrill.     Pulses: Normal pulses.          Carotid pulses are 2+ on the right side and 2+ on the left side.      Radial pulses are 2+ on the right side and 2+ on the left side.       Femoral pulses are 2+ on the right side and 2+ on the left side.      Popliteal pulses are 2+ on the right side and 2+ on the left side.       Dorsalis pedis pulses are 2+ on the right side and 2+ on the left side.       Posterior tibial pulses are 2+ on the right side and 2+ on the left side.     Heart sounds: Normal heart sounds, S1 normal and S2 normal. Heart sounds not distant. No murmur. No systolic murmur. No diastolic murmur. No friction rub. No gallop. No S3 or S4 sounds.   Pulmonary:     Effort: Pulmonary effort is normal.     Breath sounds: Normal breath sounds. No decreased air movement. No decreased breath sounds, wheezing, rhonchi or rales.  Chest:     Breasts:        Right: Normal. No swelling, bleeding, inverted nipple, mass, nipple discharge, skin change or tenderness.        Left: Normal. No  swelling, bleeding, inverted nipple, mass, nipple discharge, skin change or tenderness.  Abdominal:     General: Bowel sounds are normal. There is no distension.     Palpations: Abdomen is soft. There is no shifting dullness, hepatomegaly, splenomegaly or mass.     Tenderness: There is no abdominal tenderness. There is no guarding or rebound.  Musculoskeletal:        General: Normal range of motion.     Cervical back: Normal, full  passive range of motion without pain, normal range of motion and neck supple.     Thoracic back: Normal.     Lumbar back: Normal.     Right lower leg: No edema.     Left lower leg: No edema.  Feet:     Right foot:     Skin integrity: Skin integrity normal.     Toenail Condition: Right toenails are normal.     Left foot:     Skin integrity: Skin integrity normal.     Toenail Condition: Left toenails are normal.  Lymphadenopathy:     Head:     Right side of head: No submental, submandibular or tonsillar adenopathy.     Left side of head: No submental, submandibular or tonsillar adenopathy.     Cervical: No cervical adenopathy.     Right cervical: No superficial, deep or posterior cervical adenopathy.    Left cervical: No superficial, deep or posterior cervical adenopathy.     Upper Body:     Right upper body: No supraclavicular or axillary adenopathy.     Left upper body: No supraclavicular or axillary adenopathy.     Lower Body: No right inguinal adenopathy. No left inguinal adenopathy.  Skin:    General: Skin is warm and dry.     Capillary Refill: Capillary refill takes less than 2 seconds.  Neurological:     General: No focal deficit present.     Mental Status: She is alert.     Cranial Nerves: Cranial nerves are intact.     Sensory: Sensation is intact.     Motor: Motor function is intact.     Deep Tendon Reflexes: Reflexes are normal and symmetric.  Psychiatric:        Speech: Speech normal.        Behavior: Behavior normal. Behavior is  cooperative.        Cognition and Memory: Cognition normal.     Wt Readings from Last 3 Encounters:  04/19/19 145 lb (65.8 kg)  08/01/18 147 lb (66.7 kg)  04/04/18 146 lb (66.2 kg)    BP (!) 130/92   Pulse 78   Ht 5' (1.524 m)   Wt 145 lb (65.8 kg)   BMI 28.32 kg/m   Assessment and Plan:  1. Annual physical exam No subjective/objective concerns noted during history and physical exam.  Patient's previous encounters, most recent labs and imaging were reviewed.Hibah Odonnell is a 64 y.o. female who presents today for her Complete Annual Exam. She feels well. She reports exercising . She reports she is sleeping well. Immunizations are reviewed and recommendations provided.   Age appropriate screening tests are discussed. Counseling given for risk factor reduction interventions. - Lipid Panel With LDL/HDL Ratio - Comprehensive metabolic panel  2. Breast cancer screening by mammogram Patient with history of abnormal mammogram with normal breast exam today.  Will refer for [facility] bilateral mammograms. - MM DIAG BREAST TOMO BILATERAL; Future  3. Abnormal mammogram Patient with history of mammograms will obtain complete metabolic panel including LFT measurements. - MM DIAG BREAST TOMO BILATERAL; Future - Comprehensive metabolic panel  4. Familial hypercholesterolemia Chronic.  Controlled.  Stable.  Will check lipid panel to evaluate LDL.  Dietary approach given to patient on discharge.  Rather doubt that patient will consider statin therapy - Lipid Panel With LDL/HDL Ratio

## 2019-04-19 NOTE — Patient Instructions (Signed)
DASH Eating Plan DASH stands for "Dietary Approaches to Stop Hypertension." The DASH eating plan is a healthy eating plan that has been shown to reduce high blood pressure (hypertension). It may also reduce your risk for type 2 diabetes, heart disease, and stroke. The DASH eating plan may also help with weight loss. What are tips for following this plan?  General guidelines  Avoid eating more than 2,300 mg (milligrams) of salt (sodium) a day. If you have hypertension, you may need to reduce your sodium intake to 1,500 mg a day.  Limit alcohol intake to no more than 1 drink a day for nonpregnant women and 2 drinks a day for men. One drink equals 12 oz of beer, 5 oz of wine, or 1 oz of hard liquor.  Work with your health care provider to maintain a healthy body weight or to lose weight. Ask what an ideal weight is for you.  Get at least 30 minutes of exercise that causes your heart to beat faster (aerobic exercise) most days of the week. Activities may include walking, swimming, or biking.  Work with your health care provider or diet and nutrition specialist (dietitian) to adjust your eating plan to your individual calorie needs. Reading food labels   Check food labels for the amount of sodium per serving. Choose foods with less than 5 percent of the Daily Value of sodium. Generally, foods with less than 300 mg of sodium per serving fit into this eating plan.  To find whole grains, look for the word "whole" as the first word in the ingredient list. Shopping  Buy products labeled as "low-sodium" or "no salt added."  Buy fresh foods. Avoid canned foods and premade or frozen meals. Cooking  Avoid adding salt when cooking. Use salt-free seasonings or herbs instead of table salt or sea salt. Check with your health care provider or pharmacist before using salt substitutes.  Do not fry foods. Cook foods using healthy methods such as baking, boiling, grilling, and broiling instead.  Cook with  heart-healthy oils, such as olive, canola, soybean, or sunflower oil. Meal planning  Eat a balanced diet that includes: ? 5 or more servings of fruits and vegetables each day. At each meal, try to fill half of your plate with fruits and vegetables. ? Up to 6-8 servings of whole grains each day. ? Less than 6 oz of lean meat, poultry, or fish each day. A 3-oz serving of meat is about the same size as a deck of cards. One egg equals 1 oz. ? 2 servings of low-fat dairy each day. ? A serving of nuts, seeds, or beans 5 times each week. ? Heart-healthy fats. Healthy fats called Omega-3 fatty acids are found in foods such as flaxseeds and coldwater fish, like sardines, salmon, and mackerel.  Limit how much you eat of the following: ? Canned or prepackaged foods. ? Food that is high in trans fat, such as fried foods. ? Food that is high in saturated fat, such as fatty meat. ? Sweets, desserts, sugary drinks, and other foods with added sugar. ? Full-fat dairy products.  Do not salt foods before eating.  Try to eat at least 2 vegetarian meals each week.  Eat more home-cooked food and less restaurant, buffet, and fast food.  When eating at a restaurant, ask that your food be prepared with less salt or no salt, if possible. What foods are recommended? The items listed may not be a complete list. Talk with your dietitian about   what dietary choices are best for you. Grains Whole-grain or whole-wheat bread. Whole-grain or whole-wheat pasta. Brown rice. Oatmeal. Quinoa. Bulgur. Whole-grain and low-sodium cereals. Pita bread. Low-fat, low-sodium crackers. Whole-wheat flour tortillas. Vegetables Fresh or frozen vegetables (raw, steamed, roasted, or grilled). Low-sodium or reduced-sodium tomato and vegetable juice. Low-sodium or reduced-sodium tomato sauce and tomato paste. Low-sodium or reduced-sodium canned vegetables. Fruits All fresh, dried, or frozen fruit. Canned fruit in natural juice (without  added sugar). Meat and other protein foods Skinless chicken or turkey. Ground chicken or turkey. Pork with fat trimmed off. Fish and seafood. Egg whites. Dried beans, peas, or lentils. Unsalted nuts, nut butters, and seeds. Unsalted canned beans. Lean cuts of beef with fat trimmed off. Low-sodium, lean deli meat. Dairy Low-fat (1%) or fat-free (skim) milk. Fat-free, low-fat, or reduced-fat cheeses. Nonfat, low-sodium ricotta or cottage cheese. Low-fat or nonfat yogurt. Low-fat, low-sodium cheese. Fats and oils Soft margarine without trans fats. Vegetable oil. Low-fat, reduced-fat, or light mayonnaise and salad dressings (reduced-sodium). Canola, safflower, olive, soybean, and sunflower oils. Avocado. Seasoning and other foods Herbs. Spices. Seasoning mixes without salt. Unsalted popcorn and pretzels. Fat-free sweets. What foods are not recommended? The items listed may not be a complete list. Talk with your dietitian about what dietary choices are best for you. Grains Baked goods made with fat, such as croissants, muffins, or some breads. Dry pasta or rice meal packs. Vegetables Creamed or fried vegetables. Vegetables in a cheese sauce. Regular canned vegetables (not low-sodium or reduced-sodium). Regular canned tomato sauce and paste (not low-sodium or reduced-sodium). Regular tomato and vegetable juice (not low-sodium or reduced-sodium). Pickles. Olives. Fruits Canned fruit in a light or heavy syrup. Fried fruit. Fruit in cream or butter sauce. Meat and other protein foods Fatty cuts of meat. Ribs. Fried meat. Bacon. Sausage. Bologna and other processed lunch meats. Salami. Fatback. Hotdogs. Bratwurst. Salted nuts and seeds. Canned beans with added salt. Canned or smoked fish. Whole eggs or egg yolks. Chicken or turkey with skin. Dairy Whole or 2% milk, cream, and half-and-half. Whole or full-fat cream cheese. Whole-fat or sweetened yogurt. Full-fat cheese. Nondairy creamers. Whipped toppings.  Processed cheese and cheese spreads. Fats and oils Butter. Stick margarine. Lard. Shortening. Ghee. Bacon fat. Tropical oils, such as coconut, palm kernel, or palm oil. Seasoning and other foods Salted popcorn and pretzels. Onion salt, garlic salt, seasoned salt, table salt, and sea salt. Worcestershire sauce. Tartar sauce. Barbecue sauce. Teriyaki sauce. Soy sauce, including reduced-sodium. Steak sauce. Canned and packaged gravies. Fish sauce. Oyster sauce. Cocktail sauce. Horseradish that you find on the shelf. Ketchup. Mustard. Meat flavorings and tenderizers. Bouillon cubes. Hot sauce and Tabasco sauce. Premade or packaged marinades. Premade or packaged taco seasonings. Relishes. Regular salad dressings. Where to find more information:  National Heart, Lung, and Blood Institute: www.nhlbi.nih.gov  American Heart Association: www.heart.org Summary  The DASH eating plan is a healthy eating plan that has been shown to reduce high blood pressure (hypertension). It may also reduce your risk for type 2 diabetes, heart disease, and stroke.  With the DASH eating plan, you should limit salt (sodium) intake to 2,300 mg a day. If you have hypertension, you may need to reduce your sodium intake to 1,500 mg a day.  When on the DASH eating plan, aim to eat more fresh fruits and vegetables, whole grains, lean proteins, low-fat dairy, and heart-healthy fats.  Work with your health care provider or diet and nutrition specialist (dietitian) to adjust your eating plan to your   individual calorie needs. This information is not intended to replace advice given to you by your health care provider. Make sure you discuss any questions you have with your health care provider. Document Revised: 02/26/2017 Document Reviewed: 03/09/2016 Elsevier Patient Education  2020 Else[employer] Inc.  

## 2019-04-20 LAB — COMPREHENSIVE METABOLIC PANEL
ALT: 16 IU/L (ref 0–32)
AST: 19 IU/L (ref 0–40)
Albumin/Globulin Ratio: 1.9 (ref 1.2–2.2)
Albumin: 4.5 g/dL (ref 3.8–4.8)
Alkaline Phosphatase: 119 IU/L — ABNORMAL HIGH (ref 39–117)
BUN/Creatinine Ratio: 16 (ref 12–28)
BUN: 13 mg/dL (ref 8–27)
Bilirubin Total: 0.3 mg/dL (ref 0.0–1.2)
CO2: 23 mmol/L (ref 20–29)
Calcium: 9.9 mg/dL (ref 8.7–10.3)
Chloride: 102 mmol/L (ref 96–106)
Creatinine, Ser: 0.81 mg/dL (ref 0.57–1.00)
GFR calc Af Amer: 88 mL/min/{1.73_m2} (ref >59)
GFR calc non Af Amer: 76 mL/min/{1.73_m2} (ref >59)
Globulin, Total: 2.4 g/dL (ref 1.5–4.5)
Glucose: 84 mg/dL (ref 65–99)
Potassium: 5 mmol/L (ref 3.5–5.2)
Sodium: 140 mmol/L (ref 134–144)
Total Protein: 6.9 g/dL (ref 6.0–8.5)

## 2019-04-20 LAB — LIPID PANEL WITH LDL/HDL RATIO
Cholesterol, Total: 271 mg/dL — ABNORMAL HIGH (ref 100–199)
HDL: 82 mg/dL (ref >39)
LDL Chol Calc (NIH): 169 mg/dL — ABNORMAL HIGH (ref 0–99)
LDL/HDL Ratio: 2.1 ratio (ref 0.0–3.2)
Triglycerides: 118 mg/dL (ref 0–149)
VLDL Cholesterol Cal: 20 mg/dL (ref 5–40)

## 2019-05-08 ENCOUNTER — Ambulatory Visit
Admission: RE | Admit: 2019-05-08 | Discharge: 2019-05-08 | Disposition: A | Discharge status: Home / Self Care | Payer: Commercial Managed Care - HMO | Source: Ambulatory Visit | Attending: Family Medicine | Admitting: Family Medicine

## 2019-05-08 DIAGNOSIS — Z1231 Encounter for screening mammogram for malignant neoplasm of breast: Secondary | ICD-10-CM | POA: Diagnosis present

## 2019-05-08 DIAGNOSIS — R928 Other abnormal and inconclusive findings on diagnostic imaging of breast: Secondary | ICD-10-CM

## 2019-06-23 ENCOUNTER — Ambulatory Visit (INDEPENDENT_AMBULATORY_CARE_PROVIDER_SITE_OTHER): Payer: Commercial Managed Care - HMO | Admitting: Family Medicine

## 2019-06-23 ENCOUNTER — Encounter: Payer: Self-pay | Admitting: Family Medicine

## 2019-06-23 ENCOUNTER — Other Ambulatory Visit: Payer: Self-pay

## 2019-06-23 VITALS — BP 120/78 | HR 60 | Ht 60.0 in | Wt 146.0 lb

## 2019-06-23 DIAGNOSIS — B373 Candidiasis of vulva and vagina: Secondary | ICD-10-CM

## 2019-06-23 DIAGNOSIS — B3731 Acute candidiasis of vulva and vagina: Secondary | ICD-10-CM

## 2019-06-23 MED ORDER — NYSTATIN 100000 UNIT/GM EX CREA: 1.0000 "application " | TOPICAL_CREAM | Freq: Two times a day (BID) | CUTANEOUS | 0 refills | Status: DC

## 2019-06-23 MED ORDER — FLUCONAZOLE 150 MG PO TABS: 150.0000 mg | ORAL_TABLET | Freq: Every day | ORAL | 0 refills | Status: DC

## 2019-06-23 NOTE — Patient Instructions (Signed)
Vaginal Yeast Infection, Adult  Vaginal yeast infection is a condition that causes vaginal discharge as well as soreness, swelling, and redness (inflammation) of the vagina. This is a common condition. Some women get this infection frequently. What are the causes? This condition is caused by a change in the normal balance of the yeast (candida) and bacteria that live in the vagina. This change causes an overgrowth of yeast, which causes the inflammation. What increases the risk? The condition is more likely to develop in women who:  Take antibiotic medicines.  Have diabetes.  Take birth control pills.  Are pregnant.  Douche often.  Have a weak body defense system (immune system).  Have been taking steroid medicines for a long time.  Frequently wear tight clothing. What are the signs or symptoms? Symptoms of this condition include:  White, thick, creamy vaginal discharge.  Swelling, itching, redness, and irritation of the vagina. The lips of the vagina (vulva) may be affected as well.  Pain or a burning feeling while urinating.  Pain during sex. How is this diagnosed? This condition is diagnosed based on:  Your medical history.  A physical exam.  A pelvic exam. Your health care provider will examine a sample of your vaginal discharge under a microscope. Your health care provider may send this sample for testing to confirm the diagnosis. How is this treated? This condition is treated with medicine. Medicines may be over-the-counter or prescription. You may be told to use one or more of the following:  Medicine that is taken by mouth (orally).  Medicine that is applied as a cream (topically).  Medicine that is inserted directly into the vagina (suppository). Follow these instructions at home:  Lifestyle  Do not have sex until your health care provider approves. Tell your sex partner that you have a yeast infection. That person should go to his or her health care  provider and ask if they should also be treated.  Do not wear tight clothes, such as pantyhose or tight pants.  Wear breathable cotton underwear. General instructions  Take or apply over-the-counter and prescription medicines only as told by your health care provider.  Eat more yogurt. This may help to keep your yeast infection from returning.  Do not use tampons until your health care provider approves.  Try taking a sitz bath to help with discomfort. This is a warm water bath that is taken while you are sitting down. The water should only come up to your hips and should cover your buttocks. Do this 3-4 times per day or as told by your health care provider.  Do not douche.  If you have diabetes, keep your blood sugar levels under control.  Keep all follow-up visits as told by your health care provider. This is important. Contact a health care provider if:  You have a fever.  Your symptoms go away and then return.  Your symptoms do not get better with treatment.  Your symptoms get worse.  You have new symptoms.  You develop blisters in or around your vagina.  You have blood coming from your vagina and it is not your menstrual period.  You develop pain in your abdomen. Summary  Vaginal yeast infection is a condition that causes discharge as well as soreness, swelling, and redness (inflammation) of the vagina.  This condition is treated with medicine. Medicines may be over-the-counter or prescription.  Take or apply over-the-counter and prescription medicines only as told by your health care provider.  Do not douche.   Do not have sex or use tampons until your health care provider approves.  Contact a health care provider if your symptoms do not get better with treatment or your symptoms go away and then return. This information is not intended to replace advice given to you by your health care provider. Make sure you discuss any questions you have with your health care  provider. Document Revised: 10/14/2018 Document Reviewed: 08/02/2017 Elsevier Patient Education  2020 [employer] Inc.  

## 2019-06-23 NOTE — Progress Notes (Signed)
Date:  06/23/2019   Name:  Kirsten Moore   DOB:  1955-07-17   MRN:  [id number]   Chief Complaint: Rash (sensitive on vulva area R) side- can get sensitive occas-, but normally desitin will help it)  Rash This is a new problem. The current episode started in the past 7 days (5days). The problem is unchanged (painful). She was exposed to nothing. Pertinent negatives include no congestion, cough, diarrhea, eye pain, fatigue, fever, rhinorrhea, shortness of breath, sore throat or vomiting. (Nausea yesterday) Treatments tried: Desitin. The treatment provided no relief.    Lab Results  Component Value Date   CREATININE 0.81 04/19/2019   BUN 13 04/19/2019   NA 140 04/19/2019   K 5.0 04/19/2019   CL 102 04/19/2019   CO2 23 04/19/2019   Lab Results  Component Value Date   CHOL 271 (H) 04/19/2019   HDL 82 04/19/2019   LDLCALC 169 (H) 04/19/2019   TRIG 118 04/19/2019   CHOLHDL 3.5 04/04/2018   No results found for: TSH No results found for: HGBA1C Lab Results  Component Value Date   WBC 8.0 01/14/2015   HGB 13.9 01/14/2015   HCT 41.4 01/14/2015   MCV 92.0 01/14/2015   PLT 313 01/14/2015   Lab Results  Component Value Date   ALT 16 04/19/2019   AST 19 04/19/2019   ALKPHOS 119 (H) 04/19/2019   BILITOT 0.3 04/19/2019     Review of Systems  Constitutional: Negative.  Negative for chills, fatigue, fever and unexpected weight change.  HENT: Negative for congestion, ear discharge, ear pain, rhinorrhea, sinus pressure, sneezing and sore throat.   Eyes: Negative for photophobia, pain, discharge, redness and itching.  Respiratory: Negative for cough, shortness of breath, wheezing and stridor.   Gastrointestinal: Negative for abdominal pain, blood in stool, constipation, diarrhea, nausea and vomiting.  Endocrine: Negative for cold intolerance, heat intolerance, polydipsia, polyphagia and polyuria.  Genitourinary: Negative for dysuria, flank pain, frequency, hematuria,  menstrual problem, pelvic pain, urgency, vaginal bleeding and vaginal discharge.  Musculoskeletal: Negative for arthralgias, back pain and myalgias.  Skin: Positive for rash.  Allergic/Immunologic: Negative for environmental allergies and food allergies.  Neurological: Negative for dizziness, weakness, light-headedness, numbness and headaches.  Hematological: Negative for adenopathy. Does not bruise/bleed easily.  Psychiatric/Behavioral: Positive for dysphoric mood. The patient is not nervous/anxious.        Under stress    Patient Active Problem List   Diagnosis Date Noted  . AVNRT (AV nodal re-entry tachycardia) (HCC) 09/03/2015  . Osteoporosis 01/22/2015    Allergies  Allergen Reactions  . Sulfa Antibiotics Nausea And Vomiting    Past Surgical History:  Procedure Laterality Date  . BUNIONECTOMY Left   . CARDIAC ELECTROPHYSIOLOGY MAPPING AND ABLATION    . CARDIAC ELECTROPHYSIOLOGY STUDY AND ABLATION    . COLONOSCOPY  2011   cleared for 10 yrs- [employer]  . VAGINAL HYSTERECTOMY     partial    Social History   Tobacco Use  . Smoking status: Former Games developer  . Smokeless tobacco: Never Used  Substance Use Topics  . Alcohol use: No    Alcohol/week: 0.0 standard drinks  . Drug use: No     Medication list has been reviewed and updated.  No outpatient medications have been marked as taking for the 06/23/19 encounter (Office Visit) with [name], MD.    [facility] 2/9 Scores 06/23/2019 08/01/2018 03/31/2017 03/31/2017  PHQ - 2 Score 0 0 0 0  PHQ-  9 Score 0 0 0 -    BP Readings from Last 3 Encounters:  06/23/19 120/78  04/19/19 (!) 130/92  08/01/18 110/64    Physical Exam Vitals and nursing note reviewed.  Constitutional:      Appearance: She is well-developed.  HENT:     Head: Normocephalic.     Right Ear: Tympanic membrane, ear canal and external ear normal.     Left Ear: Tympanic membrane, ear canal and external ear normal.     Nose: Nose normal.     Mouth/Throat:       Mouth: Mucous membranes are moist.  Eyes:     General: Lids are everted, no foreign bodies appreciated. No scleral icterus.       Left eye: No foreign body or hordeolum.     Conjunctiva/sclera: Conjunctivae normal.     Right eye: Right conjunctiva is not injected.     Left eye: Left conjunctiva is not injected.     Pupils: Pupils are equal, round, and reactive to light.  Neck:     Thyroid: No thyromegaly.     Vascular: No JVD.     Trachea: No tracheal deviation.  Cardiovascular:     Rate and Rhythm: Normal rate and regular rhythm.     Heart sounds: Normal heart sounds. No murmur. No friction rub. No gallop.   Pulmonary:     Effort: Pulmonary effort is normal. No respiratory distress.     Breath sounds: Normal breath sounds. No wheezing, rhonchi or rales.  Abdominal:     General: Bowel sounds are normal.     Palpations: Abdomen is soft. There is no mass.     Tenderness: There is no abdominal tenderness. There is no guarding or rebound.  Genitourinary:    Pubic Area: No rash.      Labia:        Left: Rash present. No tenderness, lesion or injury.     Musculoskeletal:        General: No tenderness. Normal range of motion.     Cervical back: Normal range of motion and neck supple.  Lymphadenopathy:     Cervical: No cervical adenopathy.  Skin:    General: Skin is warm.     Findings: No rash.  Neurological:     Mental Status: She is alert and oriented to person, place, and time.     Cranial Nerves: No cranial nerve deficit.     Deep Tendon Reflexes: Reflexes normal.  Psychiatric:        Mood and Affect: Mood is not anxious or depressed.     Wt Readings from Last 3 Encounters:  06/23/19 146 lb (66.2 kg)  04/19/19 145 lb (65.8 kg)  08/01/18 147 lb (66.7 kg)    BP 120/78   Pulse 60   Ht 5' (1.524 m)   Wt 146 lb (66.2 kg)   BMI 28.51 kg/m   Assessment and Plan:  1. Vulvovaginal candidiasis New onset.  Persistent.  Uncontrolled symptomatically.  Patient has  irritation and discomfort of the labial area primarily of the left side.  There is noted to be erythema without any lesions or notable vaginal discharge.  This appears to be a vulvovaginitis candidiasis concern and we will initiate Diflucan 150 mg x 1 dose as well as application of nystatin cream twice a day to the inside and outside of the labia.  Patient is to call if continued symptoms with next step checking blood sugar and referral to GYN.

## 2019-08-07 ENCOUNTER — Ambulatory Visit (INDEPENDENT_AMBULATORY_CARE_PROVIDER_SITE_OTHER): Payer: Commercial Managed Care - HMO | Admitting: Family Medicine

## 2019-08-07 ENCOUNTER — Encounter: Payer: Self-pay | Admitting: Family Medicine

## 2019-08-07 ENCOUNTER — Other Ambulatory Visit: Payer: Self-pay

## 2019-08-07 VITALS — BP 140/90 | HR 68 | Ht 60.0 in | Wt 145.0 lb

## 2019-08-07 DIAGNOSIS — H1033 Unspecified acute conjunctivitis, bilateral: Secondary | ICD-10-CM

## 2019-08-07 MED ORDER — OFLOXACIN 0.3 % OP SOLN: 1.0000 [drp] | Freq: Four times a day (QID) | OPHTHALMIC | 0 refills | Status: DC

## 2019-08-07 NOTE — Progress Notes (Signed)
Date:  08/07/2019   Name:  Kirsten Moore   DOB:  04/03/1955   MRN:  [id number]   Chief Complaint: eye redness (oozing and red, burning off and on- woke up yesterday morning with eye redness)  Conjunctivitis  The current episode started yesterday. The onset was sudden. The problem occurs continuously. The problem has been unchanged. The problem is mild. Relieved by: clear eyes. Associated symptoms include eye itching, eye discharge and eye redness. Pertinent negatives include no fever, no decreased vision, no double vision, no photophobia, no abdominal pain, no constipation, no diarrhea, no nausea, no vomiting, no congestion, no ear discharge, no ear pain, no headaches, no hearing loss, no mouth sores, no rhinorrhea, no sore throat, no stridor, no swollen glands, no cough, no wheezing, no rash and no eye pain.    Lab Results  Component Value Date   CREATININE 0.81 04/19/2019   BUN 13 04/19/2019   NA 140 04/19/2019   K 5.0 04/19/2019   CL 102 04/19/2019   CO2 23 04/19/2019   Lab Results  Component Value Date   CHOL 271 (H) 04/19/2019   HDL 82 04/19/2019   LDLCALC 169 (H) 04/19/2019   TRIG 118 04/19/2019   CHOLHDL 3.5 04/04/2018   No results found for: TSH No results found for: HGBA1C Lab Results  Component Value Date   WBC 8.0 01/14/2015   HGB 13.9 01/14/2015   HCT 41.4 01/14/2015   MCV 92.0 01/14/2015   PLT 313 01/14/2015   Lab Results  Component Value Date   ALT 16 04/19/2019   AST 19 04/19/2019   ALKPHOS 119 (H) 04/19/2019   BILITOT 0.3 04/19/2019     Review of Systems  Constitutional: Negative.  Negative for chills, fatigue, fever and unexpected weight change.  HENT: Negative for congestion, ear discharge, ear pain, hearing loss, mouth sores, rhinorrhea, sinus pressure, sneezing and sore throat.   Eyes: Positive for discharge, redness and itching. Negative for double vision, photophobia and pain.  Respiratory: Negative for cough, shortness of breath,  wheezing and stridor.   Gastrointestinal: Negative for abdominal pain, blood in stool, constipation, diarrhea, nausea and vomiting.  Endocrine: Negative for cold intolerance, heat intolerance, polydipsia, polyphagia and polyuria.  Genitourinary: Negative for dysuria, flank pain, frequency, hematuria, menstrual problem, pelvic pain, urgency, vaginal bleeding and vaginal discharge.  Musculoskeletal: Negative for arthralgias, back pain and myalgias.  Skin: Negative for rash.  Allergic/Immunologic: Negative for environmental allergies and food allergies.  Neurological: Negative for dizziness, weakness, light-headedness, numbness and headaches.  Hematological: Negative for adenopathy. Does not bruise/bleed easily.  Psychiatric/Behavioral: Negative for dysphoric mood. The patient is not nervous/anxious.     Patient Active Problem List   Diagnosis Date Noted  . AVNRT (AV nodal re-entry tachycardia) ([facility]) 09/03/2015  . Osteoporosis 01/22/2015    Allergies  Allergen Reactions  . Sulfa Antibiotics Nausea And Vomiting    Past Surgical History:  Procedure Laterality Date  . BUNIONECTOMY Left   . CARDIAC ELECTROPHYSIOLOGY MAPPING AND ABLATION    . CARDIAC ELECTROPHYSIOLOGY STUDY AND ABLATION    . COLONOSCOPY  2011   cleared for 10 yrs- [employer]  . VAGINAL HYSTERECTOMY     partial    Social History   Tobacco Use  . Smoking status: Former Research scientist (life sciences)  . Smokeless tobacco: Never Used  Substance Use Topics  . Alcohol use: No    Alcohol/week: 0.0 standard drinks  . Drug use: No     Medication list has been reviewed  and updated.  Current Meds  Medication Sig  . nystatin cream (MYCOSTATIN) Apply 1 application topically 2 (two) times daily.    PHQ 2/9 Scores 08/07/2019 06/23/2019 08/01/2018 03/31/2017  PHQ - 2 Score 0 0 0 0  PHQ- 9 Score 0 0 0 0    BP Readings from Last 3 Encounters:  08/07/19 140/90  06/23/19 120/78  04/19/19 (!) 130/92    Physical Exam Vitals and nursing note  reviewed.  Constitutional:      General: She is not in acute distress.    Appearance: She is not diaphoretic.  HENT:     Head: Normocephalic and atraumatic.     Right Ear: Tympanic membrane, ear canal and external ear normal.     Left Ear: Tympanic membrane, ear canal and external ear normal.     Nose: Nose normal.  Eyes:     General: Lids are normal. Lids are everted, no foreign bodies appreciated. Vision grossly intact.        Right eye: No discharge.        Left eye: No discharge.     Extraocular Movements: Extraocular movements intact.     Conjunctiva/sclera:     Right eye: Right conjunctiva is injected.     Left eye: Left conjunctiva is injected.     Pupils: Pupils are equal, round, and reactive to light.  Neck:     Thyroid: No thyromegaly.     Vascular: No JVD.  Cardiovascular:     Rate and Rhythm: Normal rate and regular rhythm.     Heart sounds: Normal heart sounds. No murmur. No friction rub. No gallop.   Pulmonary:     Effort: Pulmonary effort is normal.     Breath sounds: Normal breath sounds.  Abdominal:     General: Bowel sounds are normal.     Palpations: Abdomen is soft. There is no mass.     Tenderness: There is no abdominal tenderness. There is no guarding.  Musculoskeletal:        General: Normal range of motion.     Cervical back: Normal range of motion and neck supple.  Lymphadenopathy:     Cervical: No cervical adenopathy.  Skin:    General: Skin is warm and dry.  Neurological:     Mental Status: She is alert.     Deep Tendon Reflexes: Reflexes are normal and symmetric.     Wt Readings from Last 3 Encounters:  08/07/19 145 lb (65.8 kg)  06/23/19 146 lb (66.2 kg)  04/19/19 145 lb (65.8 kg)    BP 140/90   Pulse 68   Ht 5' (1.524 m)   Wt 145 lb (65.8 kg)   BMI 28.32 kg/m   Assessment and Plan: 1. Acute bacterial conjunctivitis of both eyes Acute.  Persistent.  Uncontrolled.  Relatively stable.  Patient has exam consistent with bilateral  conjunctivitis.  The I have most concerned the left which initiated the discomfort was examined underneath the eyelid and no foreign body was noted.  Patient was started on Ocuflox 0.3% ophthalmic drops 1 drop in each eye every 6 hours while awake.

## 2020-04-23 ENCOUNTER — Encounter: Payer: Self-pay | Admitting: Family Medicine

## 2020-05-28 ENCOUNTER — Other Ambulatory Visit: Payer: Self-pay | Admitting: Family Medicine

## 2020-05-28 DIAGNOSIS — Z1231 Encounter for screening mammogram for malignant neoplasm of breast: Secondary | ICD-10-CM

## 2020-06-10 ENCOUNTER — Other Ambulatory Visit: Payer: Self-pay

## 2020-06-10 ENCOUNTER — Ambulatory Visit
Admission: RE | Admit: 2020-06-10 | Discharge: 2020-06-10 | Disposition: A | Discharge status: Home / Self Care | Payer: Medicare Other | Source: Ambulatory Visit | Attending: Family Medicine | Admitting: Family Medicine

## 2020-06-10 DIAGNOSIS — Z1231 Encounter for screening mammogram for malignant neoplasm of breast: Secondary | ICD-10-CM | POA: Diagnosis not present

## 2020-06-11 ENCOUNTER — Other Ambulatory Visit: Payer: Self-pay | Admitting: Family Medicine

## 2020-06-11 DIAGNOSIS — R928 Other abnormal and inconclusive findings on diagnostic imaging of breast: Secondary | ICD-10-CM

## 2020-06-11 DIAGNOSIS — R921 Mammographic calcification found on diagnostic imaging of breast: Secondary | ICD-10-CM

## 2020-06-12 ENCOUNTER — Encounter: Payer: Self-pay | Admitting: Family Medicine

## 2020-06-12 ENCOUNTER — Ambulatory Visit (INDEPENDENT_AMBULATORY_CARE_PROVIDER_SITE_OTHER): Payer: Medicare Other | Admitting: Family Medicine

## 2020-06-12 ENCOUNTER — Other Ambulatory Visit: Payer: Self-pay

## 2020-06-12 VITALS — BP 130/82 | HR 78 | Ht 60.0 in | Wt 150.0 lb

## 2020-06-12 DIAGNOSIS — R928 Other abnormal and inconclusive findings on diagnostic imaging of breast: Secondary | ICD-10-CM | POA: Diagnosis not present

## 2020-06-12 DIAGNOSIS — E7801 Familial hypercholesterolemia: Secondary | ICD-10-CM

## 2020-06-12 DIAGNOSIS — Z0000 Encounter for general adult medical examination without abnormal findings: Secondary | ICD-10-CM | POA: Diagnosis not present

## 2020-06-12 LAB — HEMOCCULT GUIAC POC 1CARD (OFFICE): Fecal Occult Blood, POC: NEGATIVE

## 2020-06-12 NOTE — Progress Notes (Signed)
Date:  06/12/2020   Name:  Kirsten Moore   DOB:  1955/11/11   MRN:  [id number]   Chief Complaint: Annual Exam  Patient is a 65 year old female who presents for a comprehensive physical exam. The patient reports the following problems: none. Health maintenance has been reviewed up to date.   Lab Results  Component Value Date   CREATININE 0.81 04/19/2019   BUN 13 04/19/2019   NA 140 04/19/2019   K 5.0 04/19/2019   CL 102 04/19/2019   CO2 23 04/19/2019   Lab Results  Component Value Date   CHOL 271 (H) 04/19/2019   HDL 82 04/19/2019   LDLCALC 169 (H) 04/19/2019   TRIG 118 04/19/2019   CHOLHDL 3.5 04/04/2018   No results found for: TSH No results found for: HGBA1C Lab Results  Component Value Date   WBC 8.0 01/14/2015   HGB 13.9 01/14/2015   HCT 41.4 01/14/2015   MCV 92.0 01/14/2015   PLT 313 01/14/2015   Lab Results  Component Value Date   ALT 16 04/19/2019   AST 19 04/19/2019   ALKPHOS 119 (H) 04/19/2019   BILITOT 0.3 04/19/2019     Review of Systems  Constitutional: Negative.  Negative for chills, fatigue, fever and unexpected weight change.  HENT: Negative for congestion, ear discharge, ear pain, rhinorrhea, sinus pressure, sneezing and sore throat.   Eyes: Negative for photophobia, pain, discharge, redness and itching.  Respiratory: Negative for cough, shortness of breath, wheezing and stridor.   Gastrointestinal: Negative for abdominal pain, blood in stool, constipation, diarrhea, nausea and vomiting.  Endocrine: Negative for cold intolerance, heat intolerance, polydipsia, polyphagia and polyuria.  Genitourinary: Negative for dysuria, flank pain, frequency, hematuria, menstrual problem, pelvic pain, urgency, vaginal bleeding and vaginal discharge.  Musculoskeletal: Negative for arthralgias, back pain and myalgias.  Skin: Negative for rash.  Allergic/Immunologic: Negative for environmental allergies and food allergies.  Neurological: Negative for  dizziness, weakness, light-headedness, numbness and headaches.  Hematological: Negative for adenopathy. Does not bruise/bleed easily.  Psychiatric/Behavioral: Negative for dysphoric mood. The patient is not nervous/anxious.     Patient Active Problem List   Diagnosis Date Noted  . AVNRT (AV nodal re-entry tachycardia) (HCC) 09/03/2015  . Osteoporosis 01/22/2015    Allergies  Allergen Reactions  . Sulfa Antibiotics Nausea And Vomiting    Past Surgical History:  Procedure Laterality Date  . BUNIONECTOMY Left   . CARDIAC ELECTROPHYSIOLOGY MAPPING AND ABLATION    . CARDIAC ELECTROPHYSIOLOGY STUDY AND ABLATION    . COLONOSCOPY  2011   cleared for 10 yrs- [employer]  . VAGINAL HYSTERECTOMY     partial    Social History   Tobacco Use  . Smoking status: Former Games developer  . Smokeless tobacco: Never Used  Substance Use Topics  . Alcohol use: No    Alcohol/week: 0.0 standard drinks  . Drug use: No     Medication list has been reviewed and updated.  No outpatient medications have been marked as taking for the 06/12/20 encounter (Office Visit) with Kirsten Limerick, MD.    [facility] 2/9 Scores 06/12/2020 08/07/2019 06/23/2019 08/01/2018  PHQ - 2 Score 0 0 0 0  PHQ- 9 Score 0 0 0 0    GAD 7 : Generalized Anxiety Score 06/12/2020 08/07/2019 06/23/2019  Nervous, Anxious, on Edge 0 0 1  Control/stop worrying 0 0 0  Worry too much - different things 0 0 0  Trouble relaxing 0 0 0  Restless  0 0 0  Easily annoyed or irritable 0 0 0  Afraid - awful might happen 0 0 0  Total GAD 7 Score 0 0 1    BP Readings from Last 3 Encounters:  06/12/20 130/82  08/07/19 140/90  06/23/19 120/78    Physical Exam Vitals and nursing note reviewed.  Constitutional:      General: She is not in acute distress.    Appearance: Normal appearance. She is well-groomed and overweight. She is not diaphoretic.  HENT:     Head: Normocephalic and atraumatic.     Jaw: There is normal jaw occlusion.     Salivary  Glands: Right salivary gland is not diffusely enlarged or tender. Left salivary gland is not diffusely enlarged or tender.     Right Ear: Hearing, tympanic membrane, ear canal and external ear normal.     Left Ear: Hearing, tympanic membrane, ear canal and external ear normal.     Nose: Nose normal. No septal deviation.     Mouth/Throat:     Lips: Pink.     Mouth: Mucous membranes are moist.     Dentition: Normal dentition.     Tongue: No lesions.     Pharynx: Oropharynx is clear. Uvula midline.  Eyes:     General: Lids are normal. Vision grossly intact. Gaze aligned appropriately. No visual field deficit.       Right eye: No discharge.        Left eye: No discharge.     Extraocular Movements: Extraocular movements intact.     Right eye: Normal extraocular motion.     Left eye: Normal extraocular motion.     Conjunctiva/sclera: Conjunctivae normal.     Pupils: Pupils are equal, round, and reactive to light.     Funduscopic exam:    Right eye: Red reflex present.        Left eye: Red reflex present. Neck:     Thyroid: No thyroid mass, thyromegaly or thyroid tenderness.     Vascular: Normal carotid pulses. No carotid bruit, hepatojugular reflux or JVD.     Trachea: Trachea and phonation normal.  Cardiovascular:     Rate and Rhythm: Normal rate and regular rhythm.     Pulses: Normal pulses.          Carotid pulses are 2+ on the right side and 2+ on the left side.      Radial pulses are 2+ on the right side and 2+ on the left side.       Femoral pulses are 2+ on the right side and 2+ on the left side.      Popliteal pulses are 2+ on the right side and 2+ on the left side.       Dorsalis pedis pulses are 2+ on the right side and 2+ on the left side.       Posterior tibial pulses are 2+ on the right side and 2+ on the left side.     Heart sounds: Normal heart sounds, S1 normal and S2 normal. No murmur heard.  No systolic murmur is present.  No diastolic murmur is present. No friction  rub. No gallop. No S3 or S4 sounds.   Pulmonary:     Effort: Pulmonary effort is normal.     Breath sounds: Normal breath sounds. No decreased breath sounds, wheezing, rhonchi or rales.  Chest:     Chest wall: No mass or tenderness.  Breasts:     Right: Normal. No swelling,  bleeding, inverted nipple, mass, nipple discharge, skin change, tenderness, axillary adenopathy or supraclavicular adenopathy.     Left: Normal. No swelling, bleeding, inverted nipple, mass, nipple discharge, skin change, tenderness, axillary adenopathy or supraclavicular adenopathy.    Abdominal:     General: Bowel sounds are normal.     Palpations: Abdomen is soft. There is no hepatomegaly, splenomegaly or mass.     Tenderness: There is no abdominal tenderness. There is no guarding.     Hernia: There is no hernia in the umbilical area, ventral area, left inguinal area or right inguinal area.  Genitourinary:    Rectum: Normal. Guaiac result negative. No mass.  Musculoskeletal:        General: Normal range of motion.     Cervical back: Full passive range of motion without pain, normal range of motion and neck supple.     Right lower leg: No edema.     Left lower leg: No edema.     Comments: Minimal scoliosis  Lymphadenopathy:     Head:     Right side of head: No submental, submandibular or tonsillar adenopathy.     Left side of head: No submental, submandibular or tonsillar adenopathy.     Cervical: No cervical adenopathy.     Right cervical: No superficial, deep or posterior cervical adenopathy.    Left cervical: No superficial, deep or posterior cervical adenopathy.     Upper Body:     Right upper body: No supraclavicular or axillary adenopathy.     Left upper body: No supraclavicular or axillary adenopathy.     Lower Body: No right inguinal adenopathy. No left inguinal adenopathy.  Skin:    General: Skin is warm and dry.     Capillary Refill: Capillary refill takes less than 2 seconds.  Neurological:      Mental Status: She is alert.     Cranial Nerves: Cranial nerves are intact.     Motor: Motor function is intact.     Coordination: Coordination is intact.     Deep Tendon Reflexes: Reflexes are normal and symmetric.     Reflex Scores:      Tricep reflexes are 2+ on the right side and 2+ on the left side.      Bicep reflexes are 2+ on the right side and 2+ on the left side.      Brachioradialis reflexes are 2+ on the right side and 2+ on the left side.      Patellar reflexes are 2+ on the right side and 2+ on the left side.      Achilles reflexes are 2+ on the right side and 2+ on the left side. Psychiatric:        Behavior: Behavior is cooperative.     Wt Readings from Last 3 Encounters:  06/12/20 150 lb (68 kg)  08/07/19 145 lb (65.8 kg)  06/23/19 146 lb (66.2 kg)    BP 130/82   Pulse 78   Ht 5' (1.524 m)   Wt 150 lb (68 kg)   BMI 29.29 kg/m   Assessment and Plan: 1. Annual physical exam No subjective/objective concerns noted during history and physical exam.  Patient's chart was reviewed from most recent encounter, most recent labs, most recent imaging, and care everywhere.Elisabetta Mishra is a 65 y.o. female who presents today for her Complete Annual Exam. She feels well. She reports exercising . She reports she is sleeping well. [name]  .Immunizations are reviewed and recommendations provided.  Age appropriate screening tests are discussed. Counseling given for risk factor reduction interventions.  We will obtain CBC, lipid panel, renal function panel. - CBC - Lipid Panel With LDL/HDL Ratio - Renal Function Panel - POCT Occult Blood Stool  2. Familial hypercholesterolemia Patient with history of elevated LDL which is controlled with dietary discretion.  We will check lipid panel for current level of LDL/triglycerides. - Lipid Panel With LDL/HDL Ratio  3. Abnormal mammogram Patient has an abnormal mammogram and further diagnostic mammogram of the breast is  recommended to further evaluate calcifications.  Patient was encouraged to proceed with additional evaluation.

## 2020-06-13 ENCOUNTER — Other Ambulatory Visit: Payer: Self-pay

## 2020-06-13 DIAGNOSIS — E7801 Familial hypercholesterolemia: Secondary | ICD-10-CM

## 2020-06-13 LAB — RENAL FUNCTION PANEL
Albumin: 4.5 g/dL (ref 3.8–4.8)
BUN/Creatinine Ratio: 15 (ref 12–28)
BUN: 12 mg/dL (ref 8–27)
CO2: 22 mmol/L (ref 20–29)
Calcium: 9.4 mg/dL (ref 8.7–10.3)
Chloride: 102 mmol/L (ref 96–106)
Creatinine, Ser: 0.81 mg/dL (ref 0.57–1.00)
Glucose: 92 mg/dL (ref 65–99)
Phosphorus: 3.8 mg/dL (ref 3.0–4.3)
Potassium: 4.7 mmol/L (ref 3.5–5.2)
Sodium: 139 mmol/L (ref 134–144)
eGFR: 80 mL/min/{1.73_m2} (ref >59)

## 2020-06-13 LAB — CBC
Hematocrit: 41.4 % (ref 34.0–46.6)
Hemoglobin: 13.8 g/dL (ref 11.1–15.9)
MCH: 30.7 pg (ref 26.6–33.0)
MCHC: 33.3 g/dL (ref 31.5–35.7)
MCV: 92 fL (ref 79–97)
Platelets: 345 10*3/uL (ref 150–450)
RBC: 4.5 x10E6/uL (ref 3.77–5.28)
RDW: 11.8 % (ref 11.7–15.4)
WBC: 6.2 10*3/uL (ref 3.4–10.8)

## 2020-06-13 LAB — LIPID PANEL WITH LDL/HDL RATIO
Cholesterol, Total: 289 mg/dL — ABNORMAL HIGH (ref 100–199)
HDL: 73 mg/dL (ref >39)
LDL Chol Calc (NIH): 185 mg/dL — ABNORMAL HIGH (ref 0–99)
LDL/HDL Ratio: 2.5 ratio (ref 0.0–3.2)
Triglycerides: 168 mg/dL — ABNORMAL HIGH (ref 0–149)
VLDL Cholesterol Cal: 31 mg/dL (ref 5–40)

## 2020-06-13 MED ORDER — EZETIMIBE 10 MG PO TABS: 10.0000 mg | ORAL_TABLET | Freq: Every day | ORAL | 1 refills | Status: DC

## 2020-06-13 NOTE — Progress Notes (Unsigned)
Sent in [facility] 

## 2020-06-24 ENCOUNTER — Ambulatory Visit: Admission: RE | Admit: 2020-06-24 | Payer: Medicare Other | Source: Ambulatory Visit

## 2020-07-29 ENCOUNTER — Other Ambulatory Visit: Payer: Self-pay | Admitting: Family Medicine

## 2020-07-29 ENCOUNTER — Ambulatory Visit
Admission: RE | Admit: 2020-07-29 | Discharge: 2020-07-29 | Disposition: A | Discharge status: Home / Self Care | Payer: Medicare Other | Source: Ambulatory Visit | Attending: Family Medicine | Admitting: Family Medicine

## 2020-07-29 ENCOUNTER — Other Ambulatory Visit: Payer: Self-pay

## 2020-07-29 DIAGNOSIS — R921 Mammographic calcification found on diagnostic imaging of breast: Secondary | ICD-10-CM | POA: Diagnosis not present

## 2020-07-29 DIAGNOSIS — R928 Other abnormal and inconclusive findings on diagnostic imaging of breast: Secondary | ICD-10-CM

## 2020-07-29 DIAGNOSIS — R922 Inconclusive mammogram: Secondary | ICD-10-CM | POA: Diagnosis not present

## 2020-07-29 DIAGNOSIS — R920 Mammographic microcalcification found on diagnostic imaging of breast: Secondary | ICD-10-CM | POA: Diagnosis not present

## 2020-08-05 ENCOUNTER — Ambulatory Visit
Admission: RE | Admit: 2020-08-05 | Discharge: 2020-08-05 | Disposition: A | Discharge status: Home / Self Care | Payer: Medicare Other | Source: Ambulatory Visit | Attending: Family Medicine | Admitting: Family Medicine

## 2020-08-05 ENCOUNTER — Other Ambulatory Visit: Payer: Self-pay

## 2020-08-05 DIAGNOSIS — R928 Other abnormal and inconclusive findings on diagnostic imaging of breast: Secondary | ICD-10-CM | POA: Insufficient documentation

## 2020-08-05 DIAGNOSIS — R920 Mammographic microcalcification found on diagnostic imaging of breast: Secondary | ICD-10-CM | POA: Diagnosis not present

## 2020-08-05 DIAGNOSIS — R921 Mammographic calcification found on diagnostic imaging of breast: Secondary | ICD-10-CM | POA: Diagnosis not present

## 2020-08-05 HISTORY — PX: BREAST BIOPSY: SHX20

## 2020-08-06 LAB — SURGICAL PATHOLOGY

## 2021-02-14 ENCOUNTER — Other Ambulatory Visit: Payer: Self-pay

## 2021-02-14 ENCOUNTER — Encounter: Payer: Self-pay | Admitting: Family Medicine

## 2021-02-14 ENCOUNTER — Ambulatory Visit (INDEPENDENT_AMBULATORY_CARE_PROVIDER_SITE_OTHER): Payer: Medicare Other | Admitting: Family Medicine

## 2021-02-14 VITALS — BP 140/90 | HR 97 | Temp 97.9°F | Ht 60.0 in | Wt 141.0 lb

## 2021-02-14 DIAGNOSIS — B349 Viral infection, unspecified: Secondary | ICD-10-CM | POA: Diagnosis not present

## 2021-02-14 DIAGNOSIS — N760 Acute vaginitis: Secondary | ICD-10-CM

## 2021-02-14 DIAGNOSIS — Z23 Encounter for immunization: Secondary | ICD-10-CM

## 2021-02-14 MED ORDER — VALACYCLOVIR HCL 1 G PO TABS: 1000.0000 mg | ORAL_TABLET | Freq: Two times a day (BID) | ORAL | 0 refills | Status: DC

## 2021-02-14 NOTE — Progress Notes (Signed)
Date:  02/14/2021   Name:  Kirsten Moore   DOB:  04-15-1955   MRN:  [id number]   Chief Complaint: No chief complaint on file.  Vaginal Pain The patient's primary symptoms include genital lesions. The patient's pertinent negatives include no genital itching, genital odor, genital rash, missed menses, pelvic pain, vaginal bleeding or vaginal discharge. This is a recurrent problem. The current episode started more than 1 month ago (most recent episode 6-8 weeks/ fiirst eval 18 months/ comes and goes). The problem occurs intermittently (comes and goes). Progression since onset: "comes and goes" The pain is moderate. The problem affects both sides. Associated symptoms include dysuria. Pertinent negatives include no frequency.   Lab Results  Component Value Date   CREATININE 0.81 06/12/2020   BUN 12 06/12/2020   NA 139 06/12/2020   K 4.7 06/12/2020   CL 102 06/12/2020   CO2 22 06/12/2020   Lab Results  Component Value Date   CHOL 289 (H) 06/12/2020   HDL 73 06/12/2020   LDLCALC 185 (H) 06/12/2020   TRIG 168 (H) 06/12/2020   CHOLHDL 3.5 04/04/2018   No results found for: TSH No results found for: HGBA1C Lab Results  Component Value Date   WBC 6.2 06/12/2020   HGB 13.8 06/12/2020   HCT 41.4 06/12/2020   MCV 92 06/12/2020   PLT 345 06/12/2020   Lab Results  Component Value Date   ALT 16 04/19/2019   AST 19 04/19/2019   ALKPHOS 119 (H) 04/19/2019   BILITOT 0.3 04/19/2019   No components found for: VITD  Review of Systems  Genitourinary:  Positive for difficulty urinating, dysuria, genital sores and vaginal pain. Negative for frequency, menstrual problem, missed menses, pelvic pain and vaginal discharge.   Patient Active Problem List   Diagnosis Date Noted   AVNRT (AV nodal re-entry tachycardia) (HCC) 09/03/2015   Osteoporosis 01/22/2015    Allergies  Allergen Reactions   Sulfa Antibiotics Nausea And Vomiting   Zetia [Ezetimibe]     Burning in abdomen     Past Surgical History:  Procedure Laterality Date   BREAST BIOPSY Left 08/05/2020   stereo bx, x clip, calcs, path pending.   BUNIONECTOMY Left    CARDIAC ELECTROPHYSIOLOGY MAPPING AND ABLATION     CARDIAC ELECTROPHYSIOLOGY STUDY AND ABLATION     COLONOSCOPY  2011   cleared for 10 yrs- [facility]   VAGINAL HYSTERECTOMY     partial    Social History   Tobacco Use   Smoking status: Former   Smokeless tobacco: Never  Substance Use Topics   Alcohol use: No    Alcohol/week: 0.0 standard drinks   Drug use: No     Medication list has been reviewed and updated.  No outpatient medications have been marked as taking for the 02/14/21 encounter (Office Visit) with [name], MD.    [facility] 2/9 Scores 02/14/2021 06/12/2020 08/07/2019 06/23/2019  PHQ - 2 Score 2 0 0 0  PHQ- 9 Score 4 0 0 0    GAD 7 : Generalized Anxiety Score 02/14/2021 06/12/2020 08/07/2019 06/23/2019  Nervous, Anxious, on Edge 1 0 0 1  Control/stop worrying 1 0 0 0  Worry too much - different things 1 0 0 0  Trouble relaxing 1 0 0 0  Restless 1 0 0 0  Easily annoyed or irritable 1 0 0 0  Afraid - awful might happen 0 0 0 0  Total GAD 7 Score 6 0 0 1  BP Readings from Last 3 Encounters:  02/14/21 140/90  06/12/20 130/82  08/07/19 140/90    Physical Exam Vitals and nursing note reviewed.  Genitourinary:    Labia:        Right: Tenderness and lesion present.        Left: Tenderness and lesion present.      Vagina: Tenderness and lesions present.     Rectum: Tenderness and external hemorrhoid present.     Comments: Erythema/excoriated/ vag /introitis mucosa erythema/deepithelial   Wt Readings from Last 3 Encounters:  02/14/21 141 lb (64 kg)  06/12/20 150 lb (68 kg)  08/07/19 145 lb (65.8 kg)    BP 140/90   Pulse 97   Temp 97.9 F (36.6 C)   Ht 5' (1.524 m)   Wt 141 lb (64 kg)   BMI 27.54 kg/m   Assessment and Plan:  1. Vulvovaginitis Patient states that this has "off-and-on" with  resolution over the past 18 months but most recently has been continuous for the last couple of months.  Noted is erythema and some D epithelialization.  Vulvar as well as extending to the anal area.  We will obtain serum IgG of herpes simplex 1 and 2.  We will initiate valacyclovir 1 g twice a day for 10 days in the event that this is herpetic in nature.  Patient's been under a lot of stress and the possibility of psoriasis is not unlikely but I am also concerned that there may be a vulvovaginal issue but since the patient has had clearance of this and then reexacerbation this makes unlikely but is a concern enough that we will refer to GYN for evaluation.  Past treatment with Premarin cream and nystatin has not resolved the problem she currently has - Ambulatory referral to Gynecology - valACYclovir (VALTREX) 1000 MG tablet; Take 1 tablet (1,000 mg total) by mouth 2 (two) times daily for 10 days.  Dispense: 20 tablet; Refill: 0  2. Viral syndrome Acute.  Episodic.  Recurrent.  Concerned that this may be viral in nature and we will obtain antibodies IgG for herpes simplex 1 and 2 and will adjust accordingly. - HSV(herpes simplex vrs) 1+2 ab-IgG  3. Need for immunization against influenza Discussed and administered - Flu Vaccine QUAD High Dose(Fluad)

## 2021-02-15 LAB — HSV(HERPES SIMPLEX VRS) I + II AB-IGG
HSV 1 Glycoprotein G Ab, IgG: 12.1 index — ABNORMAL HIGH (ref 0.00–0.90)
HSV 2 IgG, Type Spec: >23.6 index — ABNORMAL HIGH (ref 0.00–0.90)

## 2021-02-17 ENCOUNTER — Other Ambulatory Visit: Payer: Self-pay

## 2021-02-18 ENCOUNTER — Telehealth: Payer: Self-pay

## 2021-02-18 ENCOUNTER — Other Ambulatory Visit: Payer: Self-pay

## 2021-02-18 NOTE — Telephone Encounter (Signed)
Copied from CRM [id number]. Topic: Referral - Request for Referral >> Feb 18, 2021  9:37 AM Gaetana Michaelis A wrote: Has patient seen PCP for this complaint? No. *If NO, is insurance requiring patient see PCP for this issue before PCP can refer them? Referral for which specialty: OBGYN Preferred provider/office: Dr. Virgel Manifold at [facility]  Reason for referral: PCP recommendation

## 2021-02-24 ENCOUNTER — Other Ambulatory Visit: Payer: Self-pay | Admitting: Family Medicine

## 2021-02-24 DIAGNOSIS — N760 Acute vaginitis: Secondary | ICD-10-CM

## 2021-02-24 NOTE — Telephone Encounter (Signed)
Medication Refill - Medication: valACYclovir (VALTREX) 1000 MG tablet.   Has the patient contacted their pharmacy? No.  (Agent: If no, request that the patient contact the pharmacy for the refill. If patient does not wish to contact the pharmacy document the reason why and proceed with request.) Patient states PCP wanted to know when patient completed medication which was 02/23/2021. Patient states PCP stated she would like this a standing order.Patient would like medication sent to pharmacy.    Preferred Pharmacy (with phone number or street name):   [facility] - [place], [place] - [address] Phone:  [phone]  Fax:  [phone]      Has the patient been seen for an appointment in the last year OR does the patient have an upcoming appointment? Yes.    Agent: Please be advised that RX refills may take up to 3 business days. We ask that you follow-up with your pharmacy.

## 2021-02-25 ENCOUNTER — Other Ambulatory Visit: Payer: Self-pay

## 2021-02-25 DIAGNOSIS — N760 Acute vaginitis: Secondary | ICD-10-CM

## 2021-02-25 MED ORDER — VALACYCLOVIR HCL 1 G PO TABS: 1000.0000 mg | ORAL_TABLET | Freq: Every day | ORAL | 1 refills | Status: DC

## 2021-02-25 NOTE — Telephone Encounter (Signed)
Requested medications are due for refill today NO  Requested medications are on the active medication list NO  Last visit 02/14/21  Future visit scheduled 03/03/21  Notes to clinic this med is not on current med list, was ordered for 10 days only, please assess. Requested Prescriptions  Pending Prescriptions Disp Refills   valACYclovir (VALTREX) 1000 MG tablet 20 tablet 0    Sig: Take 1 tablet (1,000 mg total) by mouth 2 (two) times daily for 10 days.     Antimicrobials:  Antiviral Agents - Anti-Herpetic Passed - 02/24/2021  3:31 PM      Passed - Valid encounter within last 12 months    Recent Outpatient Visits           1 week ago Vulvovaginitis   [facility] [name], MD   8 months ago Annual physical exam   [facility] [name], MD   1 year ago Acute bacterial conjunctivitis of both eyes   [facility] [name], MD   1 year ago Vulvovaginal candidiasis   [facility] [name], MD   1 year ago Annual physical exam   [facility] [name], MD       Future Appointments             In 6 days [name], MD [facility], [facility]

## 2021-03-03 ENCOUNTER — Ambulatory Visit (INDEPENDENT_AMBULATORY_CARE_PROVIDER_SITE_OTHER): Payer: Medicare Other | Admitting: Family Medicine

## 2021-03-03 ENCOUNTER — Other Ambulatory Visit: Payer: Self-pay

## 2021-03-03 ENCOUNTER — Encounter: Payer: Self-pay | Admitting: Family Medicine

## 2021-03-03 VITALS — BP 132/80 | HR 80 | Ht 60.0 in | Wt 141.0 lb

## 2021-03-03 DIAGNOSIS — Z113 Encounter for screening for infections with a predominantly sexual mode of transmission: Secondary | ICD-10-CM | POA: Diagnosis not present

## 2021-03-03 DIAGNOSIS — A6004 Herpesviral vulvovaginitis: Secondary | ICD-10-CM | POA: Diagnosis not present

## 2021-03-03 MED ORDER — VALACYCLOVIR HCL 500 MG PO TABS: 500.0000 mg | ORAL_TABLET | Freq: Two times a day (BID) | ORAL | 6 refills | Status: DC

## 2021-03-03 NOTE — Progress Notes (Signed)
[id number]   Date:  03/03/2021   Name:  Kirsten Moore   DOB:  20-Sep-1955   MRN:  [id number]   Chief Complaint: Follow-up  Female GU Problem The patient's primary symptoms include genital lesions. Primary symptoms comment: previous rash. This is a recurrent problem. Episode onset: currently stable. The problem occurs intermittently. The problem has been gradually improving. The problem affects both sides. Pertinent negatives include no chills, constipation, frequency, headaches or rash. Exacerbated by: stress. The treatment provided mild relief.   Lab Results  Component Value Date   NA 139 06/12/2020   K 4.7 06/12/2020   CO2 22 06/12/2020   GLUCOSE 92 06/12/2020   BUN 12 06/12/2020   CREATININE 0.81 06/12/2020   CALCIUM 9.4 06/12/2020   EGFR 80 06/12/2020   GFRNONAA 76 04/19/2019   Lab Results  Component Value Date   CHOL 289 (H) 06/12/2020   HDL 73 06/12/2020   LDLCALC 185 (H) 06/12/2020   TRIG 168 (H) 06/12/2020   CHOLHDL 3.5 04/04/2018   No results found for: TSH No results found for: HGBA1C Lab Results  Component Value Date   WBC 6.2 06/12/2020   HGB 13.8 06/12/2020   HCT 41.4 06/12/2020   MCV 92 06/12/2020   PLT 345 06/12/2020   Lab Results  Component Value Date   ALT 16 04/19/2019   AST 19 04/19/2019   ALKPHOS 119 (H) 04/19/2019   BILITOT 0.3 04/19/2019   No results found for: 25OHVITD2, 25OHVITD3, VD25OH   Review of Systems  Constitutional:  Negative for chills.  Gastrointestinal:  Negative for constipation.  Genitourinary:  Negative for frequency.  Skin:  Negative for color change, pallor, rash and wound.  Neurological:  Negative for headaches.   Patient Active Problem List   Diagnosis Date Noted   AVNRT (AV nodal re-entry tachycardia) ([place]) 09/03/2015   Osteoporosis 01/22/2015    Allergies  Allergen Reactions   Sulfa Antibiotics Nausea And Vomiting   Zetia [Ezetimibe]     Burning in abdomen    Past Surgical History:  Procedure Laterality  Date   BREAST BIOPSY Left 08/05/2020   stereo bx, x clip, calcs, path pending.   BUNIONECTOMY Left    CARDIAC ELECTROPHYSIOLOGY MAPPING AND ABLATION     CARDIAC ELECTROPHYSIOLOGY STUDY AND ABLATION     COLONOSCOPY  2011   cleared for 10 yrs- [facility]   VAGINAL HYSTERECTOMY     partial    Social History   Tobacco Use   Smoking status: Former   Smokeless tobacco: Never  Substance Use Topics   Alcohol use: No    Alcohol/week: 0.0 standard drinks   Drug use: No     Medication list has been reviewed and updated.  Current Meds  Medication Sig   valACYclovir (VALTREX) 1000 MG tablet Take 1 tablet (1,000 mg total) by mouth daily.    PHQ 2/9 Scores 02/14/2021 06/12/2020 08/07/2019 06/23/2019  PHQ - 2 Score 2 0 0 0  PHQ- 9 Score 4 0 0 0    GAD 7 : Generalized Anxiety Score 02/14/2021 06/12/2020 08/07/2019 06/23/2019  Nervous, Anxious, on Edge 1 0 0 1  Control/stop worrying 1 0 0 0  Worry too much - different things 1 0 0 0  Trouble relaxing 1 0 0 0  Restless 1 0 0 0  Easily annoyed or irritable 1 0 0 0  Afraid - awful might happen 0 0 0 0  Total GAD 7 Score 6 0 0 1  BP Readings from Last 3 Encounters:  03/03/21 132/80  02/14/21 140/90  06/12/20 130/82    Physical Exam Vitals and nursing note reviewed.  HENT:     Right Ear: Tympanic membrane normal.     Left Ear: Tympanic membrane normal.     Nose: Nose normal.  Cardiovascular:     Heart sounds: No murmur heard.   No friction rub. No gallop.  Pulmonary:     Effort: No respiratory distress.     Breath sounds: No stridor. No wheezing or rhonchi.    Wt Readings from Last 3 Encounters:  03/03/21 141 lb (64 kg)  02/14/21 141 lb (64 kg)  06/12/20 150 lb (68 kg)    BP 132/80   Pulse 80   Ht 5' (1.524 m)   Wt 141 lb (64 kg)   BMI 27.54 kg/m   Assessment and Plan:  1. Recurrent vulvovaginal herpes simplex New onset.  Episodic.  Recurrent.  Since taking the Valtrex from previous encounter there has been  significant improvement of rash.  But given the fact that this is been off and on again rash we will continue for suppression with Valtrex 500 mg once a day.  In the event that there is possibility of exposure we will check GC/chlamydia/HIV and RPR. - GC/Chlamydia Probe Amp - HIV Antibody (routine testing w rflx) - RPR  2. Screening examination for STD (sexually transmitted disease) Patient has an upcoming appointment with [facility] GYN that I would feel more comfortable if they would review the antibody titers and presentation and history and determine if there is any other areas of concern on examination. - GC/Chlamydia Probe Amp - HIV Antibody (routine testing w rflx) - RPR

## 2021-03-05 LAB — RPR: RPR Ser Ql: NONREACTIVE

## 2021-03-05 LAB — HIV ANTIBODY (ROUTINE TESTING W REFLEX): HIV Screen 4th Generation wRfx: NONREACTIVE

## 2021-03-07 ENCOUNTER — Telehealth: Payer: Self-pay | Admitting: Family Medicine

## 2021-03-07 LAB — GC/CHLAMYDIA PROBE AMP
Chlamydia trachomatis, NAA: NEGATIVE
Neisseria Gonorrhoeae by PCR: NEGATIVE

## 2021-03-07 NOTE — Telephone Encounter (Signed)
Pt returning cal for lab results.  Ok for NT to disclose.  Please call her at work: [phone] Ext [phone]

## 2021-03-07 NOTE — Telephone Encounter (Signed)
See result notes. 

## 2021-03-12 DIAGNOSIS — H532 Diplopia: Secondary | ICD-10-CM | POA: Diagnosis not present

## 2021-03-12 DIAGNOSIS — H518 Other specified disorders of binocular movement: Secondary | ICD-10-CM | POA: Diagnosis not present

## 2021-04-14 DIAGNOSIS — H518 Other specified disorders of binocular movement: Secondary | ICD-10-CM | POA: Diagnosis not present

## 2021-06-16 ENCOUNTER — Ambulatory Visit: Payer: Medicare HMO

## 2021-08-06 ENCOUNTER — Ambulatory Visit (INDEPENDENT_AMBULATORY_CARE_PROVIDER_SITE_OTHER): Payer: Medicare HMO

## 2021-08-06 DIAGNOSIS — Z1231 Encounter for screening mammogram for malignant neoplasm of breast: Secondary | ICD-10-CM

## 2021-08-06 DIAGNOSIS — Z0000 Encounter for general adult medical examination without abnormal findings: Secondary | ICD-10-CM

## 2021-08-06 NOTE — Patient Instructions (Signed)
Kirsten Moore , ?Thank you for taking time to come for your Medicare Wellness Visit. I appreciate your ongoing commitment to your health goals. Please review the following plan we discussed and let me know if I can assist you in the future.  ? ?Screening recommendations/referrals: ?Colonoscopy: done 2011. Declined repeat screening ?Mammogram: done 06/10/20. Please call [phone] to schedule your mammogram.  ?Bone Density: done 03/26/15 ?Recommended yearly ophthalmology/optometry visit for glaucoma screening and checkup ?Recommended yearly dental visit for hygiene and checkup ? ?Vaccinations: ?Influenza vaccine: done 02/14/21 ?Pneumococcal vaccine: due ?Tdap vaccine: done 03/12/15 ?Shingles vaccine: Shingrix discussed. Please contact your pharmacy for coverage information.  ?Covid-19:done 05/04/19, 06/01/19, 01/30/20 & 12/12/20 ? ?Advanced directives: Please bring a copy of your health care power of attorney and living will to the office at your convenience.  ? ?Conditions/risks identified: Recommend drinking 6-8 glasses of water per day  ? ?Next appointment: Follow up in one year for your annual wellness visit  ? ? ?Preventive Care 12 Years and Older, Female ?Preventive care refers to lifestyle choices and visits with your health care provider that can promote health and wellness. ?What does preventive care include? ?A yearly physical exam. This is also called an annual well check. ?Dental exams once or twice a year. ?Routine eye exams. Ask your health care provider how often you should have your eyes checked. ?Personal lifestyle choices, including: ?Daily care of your teeth and gums. ?Regular physical activity. ?Eating a healthy diet. ?Avoiding tobacco and drug use. ?Limiting alcohol use. ?Practicing safe sex. ?Taking low-dose aspirin every day. ?Taking vitamin and mineral supplements as recommended by your health care provider. ?What happens during an annual well check? ?The services and screenings done by your health  care provider during your annual well check will depend on your age, overall health, lifestyle risk factors, and family history of disease. ?Counseling  ?Your health care provider may ask you questions about your: ?Alcohol use. ?Tobacco use. ?Drug use. ?Emotional well-being. ?Home and relationship well-being. ?Sexual activity. ?Eating habits. ?History of falls. ?Memory and ability to understand (cognition). ?Work and work Astronomer. ?Reproductive health. ?Screening  ?You may have the following tests or measurements: ?Height, weight, and BMI. ?Blood pressure. ?Lipid and cholesterol levels. These may be checked every 5 years, or more frequently if you are over 78 years old. ?Skin check. ?Lung cancer screening. You may have this screening every year starting at age 36 if you have a 30-pack-year history of smoking and currently smoke or have quit within the past 15 years. ?Fecal occult blood test (FOBT) of the stool. You may have this test every year starting at age 20. ?Flexible sigmoidoscopy or colonoscopy. You may have a sigmoidoscopy every 5 years or a colonoscopy every 10 years starting at age 42. ?Hepatitis C blood test. ?Hepatitis B blood test. ?Sexually transmitted disease (STD) testing. ?Diabetes screening. This is done by checking your blood sugar (glucose) after you have not eaten for a while (fasting). You may have this done every 1-3 years. ?Bone density scan. This is done to screen for osteoporosis. You may have this done starting at age 77. ?Mammogram. This may be done every 1-2 years. Talk to your health care provider about how often you should have regular mammograms. ?Talk with your health care provider about your test results, treatment options, and if necessary, the need for more tests. ?Vaccines  ?Your health care provider may recommend certain vaccines, such as: ?Influenza vaccine. This is recommended every year. ?Tetanus, diphtheria, and acellular pertussis (  Tdap, Td) vaccine. You may need a Td  booster every 10 years. ?Zoster vaccine. You may need this after age 85. ?Pneumococcal 13-valent conjugate (PCV13) vaccine. One dose is recommended after age 52. ?Pneumococcal polysaccharide (PPSV23) vaccine. One dose is recommended after age 22. ?Talk to your health care provider about which screenings and vaccines you need and how often you need them. ?This information is not intended to replace advice given to you by your health care provider. Make sure you discuss any questions you have with your health care provider. ?Document Released: 04/12/2015 Document Revised: 12/04/2015 Document Reviewed: 01/15/2015 ?Elsevier Interactive Patient Education ? 2017 [employer] ? ?Fall Prevention in the Home ?Falls can cause injuries. They can happen to people of all ages. There are many things you can do to make your home safe and to help prevent falls. ?What can I do on the outside of my home? ?Regularly fix the edges of walkways and driveways and fix any cracks. ?Remove anything that might make you trip as you walk through a door, such as a raised step or threshold. ?Trim any bushes or trees on the path to your home. ?Use bright outdoor lighting. ?Clear any walking paths of anything that might make someone trip, such as rocks or tools. ?Regularly check to see if handrails are loose or broken. Make sure that both sides of any steps have handrails. ?Any raised decks and porches should have guardrails on the edges. ?Have any leaves, snow, or ice cleared regularly. ?Use sand or salt on walking paths during winter. ?Clean up any spills in your garage right away. This includes oil or grease spills. ?What can I do in the bathroom? ?Use night lights. ?Install grab bars by the toilet and in the tub and shower. Do not use towel bars as grab bars. ?Use non-skid mats or decals in the tub or shower. ?If you need to sit down in the shower, use a plastic, non-slip stool. ?Keep the floor dry. Clean up any water that spills on the floor  as soon as it happens. ?Remove soap buildup in the tub or shower regularly. ?Attach bath mats securely with double-sided non-slip rug tape. ?Do not have throw rugs and other things on the floor that can make you trip. ?What can I do in the bedroom? ?Use night lights. ?Make sure that you have a light by your bed that is easy to reach. ?Do not use any sheets or blankets that are too big for your bed. They should not hang down onto the floor. ?Have a firm chair that has side arms. You can use this for support while you get dressed. ?Do not have throw rugs and other things on the floor that can make you trip. ?What can I do in the kitchen? ?Clean up any spills right away. ?Avoid walking on wet floors. ?Keep items that you use a lot in easy-to-reach places. ?If you need to reach something above you, use a strong step stool that has a grab bar. ?Keep electrical cords out of the way. ?Do not use floor polish or wax that makes floors slippery. If you must use wax, use non-skid floor wax. ?Do not have throw rugs and other things on the floor that can make you trip. ?What can I do with my stairs? ?Do not leave any items on the stairs. ?Make sure that there are handrails on both sides of the stairs and use them. Fix handrails that are broken or loose. Make sure that handrails are  as long as the stairways. ?Check any carpeting to make sure that it is firmly attached to the stairs. Fix any carpet that is loose or worn. ?Avoid having throw rugs at the top or bottom of the stairs. If you do have throw rugs, attach them to the floor with carpet tape. ?Make sure that you have a light switch at the top of the stairs and the bottom of the stairs. If you do not have them, ask someone to add them for you. ?What else can I do to help prevent falls? ?Wear shoes that: ?Do not have high heels. ?Have rubber bottoms. ?Are comfortable and fit you well. ?Are closed at the toe. Do not wear sandals. ?If you use a stepladder: ?Make sure that it is  fully opened. Do not climb a closed stepladder. ?Make sure that both sides of the stepladder are locked into place. ?Ask someone to hold it for you, if possible. ?Clearly mark and make sure that you ca

## 2021-08-06 NOTE — Progress Notes (Signed)
? ?Subjective:  ? [name] is a 66 y.o. female who presents for an Initial Medicare Annual Wellness Visit. ? ?Virtual Visit via Telephone Note ? ?I connected with  [name] on 08/06/21 at  8:45 AM EDT by telephone and verified that I am speaking with the correct person using two identifiers. ? ?Location: ?Patient: home ?[facility] ?Persons participating in the virtual visit: patient/Nurse Health Advisor ?  ?I discussed the limitations, risks, security and privacy concerns of performing an evaluation and management service by telephone and the availability of in person appointments. The patient expressed understanding and agreed to proceed. ? ?Interactive audio and video telecommunications were attempted between this nurse and patient, however failed, due to patient having technical difficulties OR patient did not have access to video capability.  We continued and completed visit with audio only. ? ?Some vital signs may be absent or patient reported.  ? ?Reather LittlerKasey Josefina Rynders, LPN ? ? ?Review of Systems    ? ?Cardiac Risk Factors include: advanced age (>[id number], 37>65 women) ? ?   ?Objective:  ?  ?There were no vitals filed for this visit. ?There is no height or weight on file to calculate BMI. ? ? ?  08/06/2021  ?  8:56 AM 03/12/2015  ?  8:47 AM 01/14/2015  ? 11:40 AM 01/14/2015  ?  9:25 AM  ?Advanced Directives  ?Does Patient Have a Medical Advance Directive? Yes No No No  ?Type of Estate agentAdvance Directive Healthcare Power of [place];Living will     ?Copy of Healthcare Power of Attorney in Chart? No - copy requested     ?Would patient like information on creating a medical advance directive?  No - patient declined information  No - patient declined information  ? ? ?Current Medications (verified) ?Outpatient Encounter Medications as of 08/06/2021  ?Medication Sig  ? valACYclovir (VALTREX) 500 MG tablet Take 1 tablet (500 mg total) by mouth 2 (two) times daily.  ? ?No facility-administered encounter medications on  file as of 08/06/2021.  ? ? ?Allergies (verified) ?Sulfa antibiotics and Zetia [ezetimibe]  ? ?History: ?Past Medical History:  ?Diagnosis Date  ? Hypertension   ? ?Past Surgical History:  ?Procedure Laterality Date  ? BREAST BIOPSY Left 08/05/2020  ? stereo bx, x clip, calcs, path pending.  ? BUNIONECTOMY Left   ? CARDIAC ELECTROPHYSIOLOGY MAPPING AND ABLATION    ? CARDIAC ELECTROPHYSIOLOGY STUDY AND ABLATION    ? COLONOSCOPY  2011  ? cleared for 10 yrs- [facility]  ? VAGINAL HYSTERECTOMY    ? partial  ? ?Family History  ?Problem Relation Age of Onset  ? Breast cancer Other 40  ? Breast cancer Mother [phone]  ? Breast cancer Sister [phone]  ? ?Social History  ? ?Socioeconomic History  ? Marital status: Widowed  ?  Spouse name: Not on file  ? Number of children: 2  ? Years of education: Not on file  ? Highest education level: Not on file  ?Occupational History  ? Not on file  ?Tobacco Use  ? Smoking status: Former  ? Smokeless tobacco: Never  ?Vaping Use  ? Vaping Use: Never used  ?Substance and Sexual Activity  ? Alcohol use: No  ?  Alcohol/week: 0.0 standard drinks  ? Drug use: No  ? Sexual activity: Not Currently  ?Other Topics Concern  ? Not on file  ?Social History Narrative  ? Pt lives alone  ? ?Social Determinants of Health  ? ?Financial Resource Strain: Low Risk   ?  Difficulty of Paying Living Expenses: Not hard at all  ?Food Insecurity: No Food Insecurity  ? Worried About Programme researcher, broadcasting/film/video in the Last Year: Never true  ? Ran Out of Food in the Last Year: Never true  ?Transportation Needs: No Transportation Needs  ? Lack of Transportation (Medical): No  ? Lack of Transportation (Non-Medical): No  ?Physical Activity: Insufficiently Active  ? Days of Exercise per Week: 2 days  ? Minutes of Exercise per Session: 20 min  ?Stress: No Stress Concern Present  ? Feeling of Stress : Not at all  ?Social Connections: Socially Isolated  ? Frequency of Communication with Friends and Family: More than three times a week  ? Frequency  of Social Gatherings with Friends and Family: Three times a week  ? Attends Religious Services: Never  ? Active Member of Clubs or Organizations: No  ? Attends Banker Meetings: Never  ? Marital Status: Widowed  ? ? ?Tobacco Counseling ?Counseling given: Not Answered ? ? ?Clinical Intake: ? ?Pre-visit preparation completed: Yes ? ?Pain : No/denies pain ? ?  ? ?Nutritional Risks: None ?Diabetes: No ? ?How often do you need to have someone help you when you read instructions, pamphlets, or other written materials from your doctor or pharmacy?: 1 - Never ? ? ?Interpreter Needed?: No ? ?Information entered by :: Reather Littler LPN ? ? ?Activities of Daily Living ? ?  08/06/2021  ?  8:58 AM 02/14/2021  ?  9:37 AM  ?In your present state of health, do you have any difficulty performing the following activities:  ?Hearing? 0 0  ?Vision? 0 0  ?Difficulty concentrating or making decisions? 0 0  ?Walking or climbing stairs? 0 0  ?Dressing or bathing? 0 0  ?Doing errands, shopping? 0 0  ?Preparing Food and eating ? N   ?Using the Toilet? N   ?In the past six months, have you accidently leaked urine? Y   ?Do you have problems with loss of bowel control? N   ?Managing your Medications? N   ?Managing your Finances? N   ?Housekeeping or managing your Housekeeping? N   ? ? ?Patient Care Team: ?[name], MD as PCP - General (Family Medicine) ? ?Indicate any recent Medical Services you may have received from other than [facility] providers in the past year (date may be approximate). ? ?   ?Assessment:  ? This is a routine wellness examination for [facility]. ? ?Hearing/Vision screen ?Hearing Screening - Comments:: Pt denies hearing difficulty ?Vision Screening - Comments:: Annual vision screenings done by  ? ?Dietary issues and exercise activities discussed: ?Current Exercise Habits: Home exercise routine, Time (Minutes): 20, Frequency (Times/Week): 2, Weekly Exercise (Minutes/Week): 40, Intensity: Moderate, Exercise limited by:  None identified ? ? Goals Addressed   ? ?  ?  ?  ?  ? This Visit's Progress  ?  DIET - INCREASE WATER INTAKE     ?  Recommend drinking 6-8 glasses of water per day  ?  ? ?  ? ?Depression Screen ? ?  08/06/2021  ?  8:54 AM 02/14/2021  ?  9:37 AM 06/12/2020  ?  9:55 AM 08/07/2019  ? 11:31 AM 06/23/2019  ?  3:53 PM 08/01/2018  ? 10:04 AM 03/31/2017  ?  8:21 AM  ?PHQ 2/9 Scores  ?PHQ - 2 Score 2 2 0 0 0 0 0  ?PHQ- 9 Score 4 4 0 0 0 0 0  ?  ?Fall Risk ? ?  08/06/2021  ?  8:58 AM 06/12/2020  ?  9:55 AM 06/23/2019  ?  3:53 PM 03/31/2017  ?  8:20 AM 09/03/2015  ?  8:26 AM  ?Fall Risk   ?Falls in the past year? 0 0 0 Yes No  ?Number falls in past yr: 0   1   ?Injury with Fall? 0   No   ?Risk for fall due to : No Fall Risks      ?Follow up Falls prevention discussed Falls evaluation completed Falls evaluation completed Falls evaluation completed   ? ? ?FALL RISK PREVENTION PERTAINING TO THE HOME: ? ?Any stairs in or around the home? Yes ?If so, are there any without handrails? No  ?Home free of loose throw rugs in walkways, pet beds, electrical cords, etc? Yes  ?Adequate lighting in your home to reduce risk of falls? Yes  ? ?ASSISTIVE DEVICES UTILIZED TO PREVENT FALLS: ? ?Life alert? No  ?Use of a cane, walker or w/c? No  ?Grab bars in the bathroom? Yes  ?Shower chair or bench in shower? No  ?Elevated toilet seat or a handicapped toilet? No  ? ?TIMED UP AND GO: ? ?Was the test performed? No . Telephonic visit.  ? ?Cognitive Function: Normal cognitive status assessed by direct observation by this Nurse Health Advisor. No abnormalities found.  ? ?  ?  ?  ? ?Immunizations ?Immunization History  ?Administered Date(s) Administered  ? Fluad Quad(high Dose 65+) 02/08/2019, 02/14/2021  ? Influenza,inj,Quad PF,6+ Mos 03/12/2015, 01/27/2017, 04/04/2018  ? Moderna Sars-Covid-2 Vaccination 05/04/2019, 06/01/2019, 01/30/2020, 12/12/2020  ? Tdap 03/12/2015  ? ? ?TDAP status: Up to date ? ?Flu Vaccine status: Up to date ? ?Pneumococcal vaccine status:  Due, Education has been provided regarding the importance of this vaccine. Advised may receive this vaccine at local pharmacy or Health Dept. Aware to provide a copy of the vaccination record if obtain

## 2021-08-12 ENCOUNTER — Ambulatory Visit
Admission: RE | Admit: 2021-08-12 | Discharge: 2021-08-12 | Disposition: A | Discharge status: Home / Self Care | Payer: Medicare HMO | Source: Ambulatory Visit | Attending: Family Medicine | Admitting: Family Medicine

## 2021-08-12 DIAGNOSIS — Z1231 Encounter for screening mammogram for malignant neoplasm of breast: Secondary | ICD-10-CM | POA: Diagnosis not present

## 2021-08-13 ENCOUNTER — Ambulatory Visit (INDEPENDENT_AMBULATORY_CARE_PROVIDER_SITE_OTHER): Payer: Medicare HMO | Admitting: Family Medicine

## 2021-08-13 ENCOUNTER — Encounter: Payer: Self-pay | Admitting: Family Medicine

## 2021-08-13 VITALS — BP 120/68 | HR 64 | Ht 60.0 in | Wt 143.0 lb

## 2021-08-13 DIAGNOSIS — Z1211 Encounter for screening for malignant neoplasm of colon: Secondary | ICD-10-CM | POA: Diagnosis not present

## 2021-08-13 DIAGNOSIS — E7801 Familial hypercholesterolemia: Secondary | ICD-10-CM

## 2021-08-13 DIAGNOSIS — Z0000 Encounter for general adult medical examination without abnormal findings: Secondary | ICD-10-CM

## 2021-08-13 LAB — HEMOCCULT GUIAC POC 1CARD (OFFICE): Fecal Occult Blood, POC: NEGATIVE

## 2021-08-13 NOTE — Progress Notes (Signed)
? ? ?Date:  08/13/2021  ? ?Name:  Kirsten Moore   DOB:  Apr 13, 1955   MRN:  [id number] ? ? ?Chief Complaint: Annual Exam ? ?Patient is a 66year old female who presents for a comprehensive physical exam. The patient reports the following problems: None. Health maintenance has been reviewed up-to-date ?  ? ? ?Lab Results  ?Component Value Date  ? NA 139 06/12/2020  ? K 4.7 06/12/2020  ? CO2 22 06/12/2020  ? GLUCOSE 92 06/12/2020  ? BUN 12 06/12/2020  ? CREATININE 0.81 06/12/2020  ? CALCIUM 9.4 06/12/2020  ? EGFR 80 06/12/2020  ? GFRNONAA 76 04/19/2019  ? ?Lab Results  ?Component Value Date  ? CHOL 289 (H) 06/12/2020  ? HDL 73 06/12/2020  ? LDLCALC 185 (H) 06/12/2020  ? TRIG 168 (H) 06/12/2020  ? CHOLHDL 3.5 04/04/2018  ? ?No results found for: TSH ?No results found for: HGBA1C ?Lab Results  ?Component Value Date  ? WBC 6.2 06/12/2020  ? HGB 13.8 06/12/2020  ? HCT 41.4 06/12/2020  ? MCV 92 06/12/2020  ? PLT 345 06/12/2020  ? ?Lab Results  ?Component Value Date  ? ALT 16 04/19/2019  ? AST 19 04/19/2019  ? ALKPHOS 119 (H) 04/19/2019  ? BILITOT 0.3 04/19/2019  ? ?No results found for: 25OHVITD2, [place], VD25OH  ? ?Review of Systems  ?Constitutional:  Negative for chills and fever.  ?HENT:  Negative for drooling, ear discharge, ear pain and sore throat.   ?Respiratory:  Negative for cough, shortness of breath and wheezing.   ?Cardiovascular:  Negative for chest pain, palpitations and leg swelling.  ?Gastrointestinal:  Negative for abdominal pain, blood in stool, constipation, diarrhea and nausea.  ?Endocrine: Negative for polydipsia.  ?Genitourinary:  Negative for dysuria, frequency, hematuria and urgency.  ?Musculoskeletal:  Negative for back pain, myalgias and neck pain.  ?Skin:  Negative for rash.  ?Allergic/Immunologic: Negative for environmental allergies.  ?Neurological:  Negative for dizziness and headaches.  ?Hematological:  Does not bruise/bleed easily.  ?Psychiatric/Behavioral:  Negative for suicidal ideas.  The patient is not nervous/anxious.   ? ?Patient Active Problem List  ? Diagnosis Date Noted  ? AVNRT (AV nodal re-entry tachycardia) ([place]) 09/03/2015  ? Osteoporosis 01/22/2015  ? ? ?Allergies  ?Allergen Reactions  ? Sulfa Antibiotics Nausea And Vomiting  ? Zetia [Ezetimibe]   ?  Burning in abdomen  ? ? ?Past Surgical History:  ?Procedure Laterality Date  ? BREAST BIOPSY Left 08/05/2020  ? stereo bx, x clip, calcs, path pending.  ? BUNIONECTOMY Left   ? CARDIAC ELECTROPHYSIOLOGY MAPPING AND ABLATION    ? CARDIAC ELECTROPHYSIOLOGY STUDY AND ABLATION    ? COLONOSCOPY  2011  ? cleared for 10 yrs- [place] [facility]  ? VAGINAL HYSTERECTOMY    ? partial  ? ? ?Social History  ? ?Tobacco Use  ? Smoking status: Former  ? Smokeless tobacco: Never  ?Vaping Use  ? Vaping Use: Never used  ?Substance Use Topics  ? Alcohol use: No  ?  Alcohol/week: 0.0 standard drinks  ? Drug use: No  ? ? ? ?Medication list has been reviewed and updated. ? ?Current Meds  ?Medication Sig  ? valACYclovir (VALTREX) 500 MG tablet Take 1 tablet (500 mg total) by mouth 2 (two) times daily.  ? ? ? ?  08/13/2021  ?  8:59 AM 02/14/2021  ?  9:37 AM 06/12/2020  ?  9:55 AM 08/07/2019  ? 11:32 AM  ?GAD 7 : Generalized Anxiety Score  ?Nervous,  Anxious, on Edge 0 1 0 0  ?Control/stop worrying 0 1 0 0  ?Worry too much - different things 0 1 0 0  ?Trouble relaxing 0 1 0 0  ?Restless 0 1 0 0  ?Easily annoyed or irritable 0 1 0 0  ?Afraid - awful might happen 0 0 0 0  ?Total GAD 7 Score 0 6 0 0  ?Anxiety Difficulty Not difficult at all     ? ? ? ?  08/13/2021  ?  8:58 AM  ?Depression screen PHQ 2/9  ?Decreased Interest 0  ?Down, Depressed, Hopeless 0  ?PHQ - 2 Score 0  ?Altered sleeping 0  ?Tired, decreased energy 0  ?Change in appetite 0  ?Feeling bad or failure about yourself  0  ?Trouble concentrating 0  ?Moving slowly or fidgety/restless 0  ?Suicidal thoughts 0  ?PHQ-9 Score 0  ?Difficult doing work/chores Not difficult at all  ? ? ?BP Readings from Last 3 Encounters:   ?08/13/21 120/68  ?03/03/21 132/80  ?02/14/21 140/90  ? ? ?Physical Exam ?Vitals and nursing note reviewed.  ?Constitutional:   ?   Appearance: She is well-developed, well-groomed and normal weight.  ?HENT:  ?   Head: Normocephalic.  ?   Right Ear: Hearing, tympanic membrane, ear canal and external ear normal.  ?   Left Ear: Hearing, tympanic membrane, ear canal and external ear normal.  ?   Nose: Nose normal. No congestion or rhinorrhea.  ?   Mouth/Throat:  ?   Lips: Pink.  ?   Mouth: Mucous membranes are moist.  ?   Dentition: Normal dentition. No dental tenderness or dental caries.  ?Eyes:  ?   General: Lids are normal. Vision grossly intact. Gaze aligned appropriately. No scleral icterus.    ?   Right eye: No discharge.     ?   Left eye: No foreign body, discharge or hordeolum.  ?   Extraocular Movements: Extraocular movements intact.  ?   Conjunctiva/sclera: Conjunctivae normal.  ?   Right eye: Right conjunctiva is not injected.  ?   Left eye: Left conjunctiva is not injected.  ?   Pupils: Pupils are equal, round, and reactive to light.  ?   Funduscopic exam: ?   Right eye: Red reflex present.     ?   Left eye: Red reflex present. ?Neck:  ?   Thyroid: No thyroid mass, thyromegaly or thyroid tenderness.  ?   Vascular: Normal carotid pulses. No carotid bruit, hepatojugular reflux or JVD.  ?   Trachea: Trachea normal. No tracheal deviation.  ?Cardiovascular:  ?   Rate and Rhythm: Normal rate and regular rhythm.  ?   Chest Wall: PMI is not displaced.  ?   Pulses: Normal pulses. No decreased pulses.     ?     Carotid pulses are 2+ on the right side and 2+ on the left side. ?     Radial pulses are 2+ on the right side and 2+ on the left side.  ?     Femoral pulses are 2+ on the right side and 2+ on the left side. ?     Popliteal pulses are 2+ on the right side and 2+ on the left side.  ?     Dorsalis pedis pulses are 2+ on the right side and 2+ on the left side.  ?     Posterior tibial pulses are 2+ on the right  side and 2+ on the left side.  ?  Heart sounds: Normal heart sounds, S1 normal and S2 normal. No murmur heard. ?No systolic murmur is present.  ?No diastolic murmur is present.  ?  No friction rub. No gallop. No S3 or S4 sounds.  ?Pulmonary:  ?   Effort: Pulmonary effort is normal. No respiratory distress.  ?   Breath sounds: Normal breath sounds. No decreased breath sounds, wheezing, rhonchi or rales.  ?Chest:  ?   Chest wall: No tenderness.  ?Breasts: ?   Breasts are symmetrical.  ?   Right: No swelling, bleeding, inverted nipple, mass, nipple discharge, skin change or tenderness.  ?   Left: No swelling, bleeding, inverted nipple, mass, nipple discharge, skin change or tenderness.  ?Abdominal:  ?   General: Bowel sounds are normal.  ?   Palpations: Abdomen is soft. There is no hepatomegaly, splenomegaly or mass.  ?   Tenderness: There is no abdominal tenderness. There is no guarding or rebound.  ?   Hernia: There is no hernia in the left inguinal area or right inguinal area.  ?Genitourinary: ?   Labia:     ?   Right: No tenderness.     ?   Left: No tenderness.   ?   Rectum: Normal. Guaiac result negative. No mass.  ?Musculoskeletal:     ?   General: No tenderness. Normal range of motion.  ?   Cervical back: Full passive range of motion without pain, normal range of motion and neck supple.  ?   Right lower leg: No edema.  ?   Left lower leg: No edema.  ?Feet:  ?   Right foot:  ?   Skin integrity: Skin integrity normal.  ?   Left foot:  ?   Skin integrity: Skin integrity normal.  ?Lymphadenopathy:  ?   Head:  ?   Right side of head: No submandibular or tonsillar adenopathy.  ?   Left side of head: No submandibular or tonsillar adenopathy.  ?   Cervical: No cervical adenopathy.  ?   Right cervical: No superficial, deep or posterior cervical adenopathy. ?   Left cervical: No superficial, deep or posterior cervical adenopathy.  ?   Upper Body:  ?   Right upper body: No supraclavicular or axillary adenopathy.  ?   Left  upper body: No supraclavicular or axillary adenopathy.  ?   Lower Body: No right inguinal adenopathy. No left inguinal adenopathy.  ?Skin: ?   General: Skin is warm.  ?   Capillary Refill: Capillary refill ta

## 2021-08-14 LAB — LIPID PANEL WITH LDL/HDL RATIO
Cholesterol, Total: 273 mg/dL — ABNORMAL HIGH (ref 100–199)
HDL: 77 mg/dL (ref >39)
LDL Chol Calc (NIH): 170 mg/dL — ABNORMAL HIGH (ref 0–99)
LDL/HDL Ratio: 2.2 ratio (ref 0.0–3.2)
Triglycerides: 149 mg/dL (ref 0–149)
VLDL Cholesterol Cal: 26 mg/dL (ref 5–40)

## 2021-08-14 LAB — CBC WITH DIFFERENTIAL/PLATELET
Basophils Absolute: 0 x10E3/uL (ref 0.0–0.2)
Basos: 0 %
EOS (ABSOLUTE): 0 x10E3/uL (ref 0.0–0.4)
Eos: 1 %
Hematocrit: 41.1 % (ref 34.0–46.6)
Hemoglobin: 14.1 g/dL (ref 11.1–15.9)
Immature Grans (Abs): 0 x10E3/uL (ref 0.0–0.1)
Immature Granulocytes: 0 %
Lymphocytes Absolute: 1.8 x10E3/uL (ref 0.7–3.1)
Lymphs: 24 %
MCH: 33.2 pg — ABNORMAL HIGH (ref 26.6–33.0)
MCHC: 34.3 g/dL (ref 31.5–35.7)
MCV: 97 fL (ref 79–97)
Monocytes Absolute: 0.4 x10E3/uL (ref 0.1–0.9)
Monocytes: 5 %
Neutrophils Absolute: 5.5 x10E3/uL (ref 1.4–7.0)
Neutrophils: 70 %
Platelets: 311 x10E3/uL (ref 150–450)
RBC: 4.25 x10E6/uL (ref 3.77–5.28)
RDW: 13.1 % (ref 11.7–15.4)
WBC: 7.7 x10E3/uL (ref 3.4–10.8)

## 2021-08-14 LAB — COMPREHENSIVE METABOLIC PANEL WITH GFR
ALT: 27 IU/L (ref 0–32)
AST: 28 IU/L (ref 0–40)
Albumin/Globulin Ratio: 1.9 (ref 1.2–2.2)
Albumin: 4.3 g/dL (ref 3.8–4.8)
Alkaline Phosphatase: 114 IU/L (ref 44–121)
BUN/Creatinine Ratio: 17 (ref 12–28)
BUN: 13 mg/dL (ref 8–27)
Bilirubin Total: 0.3 mg/dL (ref 0.0–1.2)
CO2: 22 mmol/L (ref 20–29)
Calcium: 9.2 mg/dL (ref 8.7–10.3)
Chloride: 103 mmol/L (ref 96–106)
Creatinine, Ser: 0.78 mg/dL (ref 0.57–1.00)
Globulin, Total: 2.3 g/dL (ref 1.5–4.5)
Glucose: 90 mg/dL (ref 70–99)
Potassium: 4.5 mmol/L (ref 3.5–5.2)
Sodium: 140 mmol/L (ref 134–144)
Total Protein: 6.6 g/dL (ref 6.0–8.5)
eGFR: 83 mL/min/1.73

## 2021-09-08 ENCOUNTER — Telehealth: Payer: Self-pay | Admitting: Family Medicine

## 2021-09-08 NOTE — Telephone Encounter (Signed)
Copied from [place] [id number]. Topic: General - Other >> Sep 08, 2021  2:41 PM [place] B wrote: Reason for CRM:pt returning call back nit sure what it was about.

## 2021-10-31 ENCOUNTER — Encounter: Payer: Self-pay | Admitting: Family Medicine

## 2021-10-31 ENCOUNTER — Ambulatory Visit (INDEPENDENT_AMBULATORY_CARE_PROVIDER_SITE_OTHER): Payer: Medicare HMO | Admitting: Family Medicine

## 2021-10-31 VITALS — BP 100/80 | HR 64 | Ht 60.0 in | Wt 143.0 lb

## 2021-10-31 DIAGNOSIS — J0100 Acute maxillary sinusitis, unspecified: Secondary | ICD-10-CM

## 2021-10-31 DIAGNOSIS — R051 Acute cough: Secondary | ICD-10-CM | POA: Diagnosis not present

## 2021-10-31 MED ORDER — AMOXICILLIN 500 MG PO CAPS: 500.0000 mg | ORAL_CAPSULE | Freq: Three times a day (TID) | ORAL | 1 refills | Status: DC

## 2021-10-31 MED ORDER — GUAIFENESIN-CODEINE 100-10 MG/5ML PO SYRP: 5.0000 mL | ORAL_SOLUTION | Freq: Three times a day (TID) | ORAL | 0 refills | Status: DC | PRN

## 2021-10-31 NOTE — Progress Notes (Signed)
Date:  10/31/2021   Name:  Kirsten Moore   DOB:  1955-05-03   MRN:  [id number]   Chief Complaint: Cough (Gets worse at night- no production, no fever or chills, head is hurting from the cough)  Cough This is a new problem. The current episode started in the past 7 days (Sunday). The problem occurs every few minutes. The cough is Productive of purulent sputum (yellow). Associated symptoms include headaches, nasal congestion, rhinorrhea, shortness of breath and sweats. Pertinent negatives include no chest pain, chills, ear congestion, ear pain, fever, postnasal drip, sore throat or wheezing. Exacerbated by: supine. Treatments tried: ibuprofen. The treatment provided moderate relief. There is no history of asthma.    Lab Results  Component Value Date   NA 140 08/13/2021   K 4.5 08/13/2021   CO2 22 08/13/2021   GLUCOSE 90 08/13/2021   BUN 13 08/13/2021   CREATININE 0.78 08/13/2021   CALCIUM 9.2 08/13/2021   EGFR 83 08/13/2021   GFRNONAA 76 04/19/2019   Lab Results  Component Value Date   CHOL 273 (H) 08/13/2021   HDL 77 08/13/2021   LDLCALC 170 (H) 08/13/2021   TRIG 149 08/13/2021   CHOLHDL 3.5 04/04/2018   No results found for: "TSH" No results found for: "HGBA1C" Lab Results  Component Value Date   WBC 7.7 08/13/2021   HGB 14.1 08/13/2021   HCT 41.1 08/13/2021   MCV 97 08/13/2021   PLT 311 08/13/2021   Lab Results  Component Value Date   ALT 27 08/13/2021   AST 28 08/13/2021   ALKPHOS 114 08/13/2021   BILITOT 0.3 08/13/2021   No results found for: "25OHVITD2", "25OHVITD3", "VD25OH"   Review of Systems  Constitutional:  Negative for chills and fever.  HENT:  Positive for congestion and rhinorrhea. Negative for ear pain, postnasal drip, sinus pressure, sinus pain and sore throat.   Respiratory:  Positive for cough and shortness of breath. Negative for wheezing.   Cardiovascular:  Negative for chest pain and palpitations.  Gastrointestinal:  Negative for  constipation.  Neurological:  Positive for headaches.    Patient Active Problem List   Diagnosis Date Noted   AVNRT (AV nodal re-entry tachycardia) ([place]) 09/03/2015   Osteoporosis 01/22/2015    Allergies  Allergen Reactions   Sulfa Antibiotics Nausea And Vomiting   Zetia [Ezetimibe]     Burning in abdomen    Past Surgical History:  Procedure Laterality Date   BREAST BIOPSY Left 08/05/2020   stereo bx, x clip, calcs, path pending.   BUNIONECTOMY Left    CARDIAC ELECTROPHYSIOLOGY MAPPING AND ABLATION     CARDIAC ELECTROPHYSIOLOGY STUDY AND ABLATION     COLONOSCOPY  2011   cleared for 10 yrs- [facility]   VAGINAL HYSTERECTOMY     partial    Social History   Tobacco Use   Smoking status: Former   Smokeless tobacco: Never  Scientific laboratory technician Use: Never used  Substance Use Topics   Alcohol use: No    Alcohol/week: 0.0 standard drinks of alcohol   Drug use: No     Medication list has been reviewed and updated.  Current Meds  Medication Sig   valACYclovir (VALTREX) 500 MG tablet Take 1 tablet (500 mg total) by mouth 2 (two) times daily.       10/31/2021    8:48 AM 08/13/2021    8:59 AM 02/14/2021    9:37 AM 06/12/2020    9:55 AM  GAD  7 : Generalized Anxiety Score  Nervous, Anxious, on Edge 0 0 1 0  Control/stop worrying 0 0 1 0  Worry too much - different things 0 0 1 0  Trouble relaxing 0 0 1 0  Restless 0 0 1 0  Easily annoyed or irritable 0 0 1 0  Afraid - awful might happen 0 0 0 0  Total GAD 7 Score 0 0 6 0  Anxiety Difficulty Not difficult at all Not difficult at all         10/31/2021    8:48 AM 08/13/2021    8:58 AM 08/06/2021    8:54 AM  Depression screen PHQ 2/9  Decreased Interest 0 0 1  Down, Depressed, Hopeless 0 0 1  PHQ - 2 Score 0 0 2  Altered sleeping 0 0 1  Tired, decreased energy 0 0 1  Change in appetite 0 0 0  Feeling bad or failure about yourself  0 0 0  Trouble concentrating 0 0 0  Moving slowly or fidgety/restless 0 0 0   Suicidal thoughts 0 0 0  PHQ-9 Score 0 0 4  Difficult doing work/chores Not difficult at all Not difficult at all Not difficult at all    BP Readings from Last 3 Encounters:  10/31/21 100/80  08/13/21 120/68  03/03/21 132/80    Physical Exam Vitals and nursing note reviewed. Exam conducted with a chaperone present.  Constitutional:      General: She is not in acute distress.    Appearance: She is not diaphoretic.  HENT:     Head: Normocephalic and atraumatic.     Right Ear: Hearing, tympanic membrane, ear canal and external ear normal.     Left Ear: Hearing, tympanic membrane, ear canal and external ear normal.     Nose:     Right Turbinates: Swollen.     Left Turbinates: Swollen.     Right Sinus: No maxillary sinus tenderness or frontal sinus tenderness.     Left Sinus: Maxillary sinus tenderness present. No frontal sinus tenderness.     Mouth/Throat:     Lips: Pink.     Mouth: Mucous membranes are moist.     Pharynx: Uvula midline. Posterior oropharyngeal erythema present.     Tonsils: No tonsillar exudate.  Eyes:     General:        Right eye: No discharge.        Left eye: No discharge.     Conjunctiva/sclera: Conjunctivae normal.     Right eye: Right conjunctiva is not injected.     Left eye: Left conjunctiva is not injected.     Pupils: Pupils are equal, round, and reactive to light.  Neck:     Thyroid: No thyromegaly.     Vascular: No JVD.  Cardiovascular:     Rate and Rhythm: Normal rate and regular rhythm.     Chest Wall: PMI is not displaced. No thrill.     Pulses: Normal pulses.     Heart sounds: Normal heart sounds, S1 normal and S2 normal. No murmur heard.    No systolic murmur is present.     No diastolic murmur is present.     No friction rub. No gallop. No S3 or S4 sounds.  Pulmonary:     Effort: Pulmonary effort is normal.     Breath sounds: Normal breath sounds. No decreased breath sounds, wheezing, rhonchi or rales.  Abdominal:     General:  Bowel sounds are normal.  Palpations: Abdomen is soft. There is no hepatomegaly or splenomegaly.     Tenderness: There is no abdominal tenderness.  Musculoskeletal:        General: Normal range of motion.     Cervical back: Normal range of motion and neck supple.     Right lower leg: No edema.     Left lower leg: No edema.  Lymphadenopathy:     Cervical: No cervical adenopathy.     Right cervical: No superficial, deep or posterior cervical adenopathy.    Left cervical: No superficial, deep or posterior cervical adenopathy.  Skin:    General: Skin is warm and dry.     Comments: No petichial rash  Neurological:     Mental Status: She is alert.     Deep Tendon Reflexes: Reflexes are normal and symmetric.     Wt Readings from Last 3 Encounters:  10/31/21 143 lb (64.9 kg)  08/13/21 143 lb (64.9 kg)  03/03/21 141 lb (64 kg)    BP 100/80   Pulse 64   Ht 5' (1.524 m)   Wt 143 lb (64.9 kg)   BMI 27.93 kg/m   Assessment and Plan:  1. Acute maxillary sinusitis, recurrence not specified New onset.  Persistent.  Purulent nasal discharge and cough.  Exam and history is consistent with an acute maxillary sinus of the left side.  We will treat with amoxicillin capsules 500 mg 3 times a day for 10 days.  2. Acute cough New onset.  Persistent and currently on day 6.  This is productive and nature with yellow tent.  We will treat with Robitussin AC 1 teaspoon every 6 hours for 5 days as needed.

## 2021-11-10 ENCOUNTER — Ambulatory Visit: Payer: Self-pay

## 2021-11-10 NOTE — Telephone Encounter (Signed)
Summary: discuss medication   Pt inquiring if she should continue taking the amoxicillin prescribed last week due to it having a refill     Please advise       1. Acute maxillary sinusitis, recurrence not specified New onset.  Persistent.  Purulent nasal discharge and cough.  Exam and history is consistent with an acute maxillary sinus of the left side.  We will treat with amoxicillin capsules 500 mg 3 times a day for 10 days.   Called pt - LMOMTCB.

## 2021-11-10 NOTE — Telephone Encounter (Signed)
  Chief Complaint: Medication question Symptoms:  Frequency:  Pertinent Negatives: Patient denies  Disposition: [] ED /[] Urgent Care (no appt availability in office) / [] Appointment(In office/virtual)/ []  [facility]/ [] Home Care/ [] Refused Recommended Disposition /[] [facility]/ [x]  Follow-up with PCP Additional Notes: Pr was seen last week , and given amoxicillin. Per notes pt was to take 3 times daily for 10 days. Pt has completed the course, but noticed that there is a refill on this medication. Is pt to refill and continue this medication.   PT requests a call back  -and please leave message on machine with directions if no answer.     Assessment and Plan:   1. Acute maxillary sinusitis, recurrence not specified New onset.  Persistent.  Purulent nasal discharge and cough.  Exam and history is consistent with an acute maxillary sinus of the left side.  We will treat with amoxicillin capsules 500 mg 3 times a day for 10 days.  Summary: discuss medication   Pt inquiring if she should continue taking the amoxicillin prescribed last week due to it having a refill     Please advise      Reason for Disposition  [1] Caller has NON-URGENT medicine question about med that PCP prescribed AND [2] triager unable to answer question  Answer Assessment - Initial Assessment Questions 1. NAME of MEDICINE: "What medicine(s) are you calling about?"     Amoxicillin 2. QUESTION: "What is your question?" (e.g., double dose of medicine, side effect)     Should rx be refilled 3. PRESCRIBER: "Who prescribed the medicine?" Reason: if prescribed by specialist, call should be referred to that group.     Dr. 4. SYMPTOMS: "Do you have any symptoms?" If Yes, ask: "What symptoms are you having?"  "How bad are the symptoms (e.g., mild, moderate, severe)      5. PREGNANCY:  "Is there any chance that you are pregnant?" "When was your last menstrual period?"     na  Protocols used:  Medication Question Call-A-AH

## 2022-05-26 DIAGNOSIS — H2513 Age-related nuclear cataract, bilateral: Secondary | ICD-10-CM | POA: Diagnosis not present

## 2022-05-26 DIAGNOSIS — G453 Amaurosis fugax: Secondary | ICD-10-CM | POA: Diagnosis not present

## 2022-05-26 DIAGNOSIS — H518 Other specified disorders of binocular movement: Secondary | ICD-10-CM | POA: Diagnosis not present

## 2022-06-01 ENCOUNTER — Encounter: Payer: Self-pay | Admitting: Family Medicine

## 2022-06-01 ENCOUNTER — Ambulatory Visit (INDEPENDENT_AMBULATORY_CARE_PROVIDER_SITE_OTHER): Payer: Medicare HMO | Admitting: Family Medicine

## 2022-06-01 VITALS — BP 130/78 | HR 67 | Ht 60.0 in | Wt 139.0 lb

## 2022-06-01 DIAGNOSIS — E7801 Familial hypercholesterolemia: Secondary | ICD-10-CM

## 2022-06-01 DIAGNOSIS — H539 Unspecified visual disturbance: Secondary | ICD-10-CM | POA: Diagnosis not present

## 2022-06-01 DIAGNOSIS — O161 Unspecified maternal hypertension, first trimester: Secondary | ICD-10-CM

## 2022-06-01 MED ORDER — HYDROCHLOROTHIAZIDE 12.5 MG PO TABS
12.5000 mg | ORAL_TABLET | Freq: Every day | ORAL | 3 refills | Status: DC
Start: 2022-06-01 — End: 2022-08-27

## 2022-06-01 NOTE — Progress Notes (Signed)
Date:  06/01/2022   Name:  Kirsten Moore   DOB:  June 29, 1955   MRN:  [id number]   Chief Complaint: eye concern (Several weeks ago- felt like a shade went down over eye and came back up.)  Eye Problem  Affected eye: uncertain. This is a new problem. The problem occurs intermittently. The problem has been waxing and waning. There was no injury mechanism. The patient is experiencing no pain. Associated symptoms include double vision. Pertinent negatives include no nausea. Associated symptoms comments: "Curtain came down, then went back up". She has tried nothing for the symptoms.  Hyperlipidemia This is a chronic problem. The current episode started more than 1 year ago. The problem is uncontrolled. Pertinent negatives include no chest pain, focal sensory loss, focal weakness or shortness of breath. Current antihyperlipidemic treatment includes diet change. The current treatment provides mild improvement of lipids. There are no compliance problems.  Risk factors for coronary artery disease include dyslipidemia.  Hypertension This is a chronic problem. The problem has been waxing and waning since onset. The problem is uncontrolled. Pertinent negatives include no chest pain, palpitations or shortness of breath.    Lab Results  Component Value Date   NA 140 08/13/2021   K 4.5 08/13/2021   CO2 22 08/13/2021   GLUCOSE 90 08/13/2021   BUN 13 08/13/2021   CREATININE 0.78 08/13/2021   CALCIUM 9.2 08/13/2021   EGFR 83 08/13/2021   GFRNONAA 76 04/19/2019   Lab Results  Component Value Date   CHOL 273 (H) 08/13/2021   HDL 77 08/13/2021   LDLCALC 170 (H) 08/13/2021   TRIG 149 08/13/2021   CHOLHDL 3.5 04/04/2018   No results found for: "TSH" No results found for: "HGBA1C" Lab Results  Component Value Date   WBC 7.7 08/13/2021   HGB 14.1 08/13/2021   HCT 41.1 08/13/2021   MCV 97 08/13/2021   PLT 311 08/13/2021   Lab Results  Component Value Date   ALT 27 08/13/2021   AST 28  08/13/2021   ALKPHOS 114 08/13/2021   BILITOT 0.3 08/13/2021   No results found for: "25OHVITD2", "25OHVITD3", "VD25OH"   Review of Systems  Constitutional:  Negative for unexpected weight change.  HENT:  Negative for sinus pressure.   Eyes:  Positive for double vision.  Respiratory:  Negative for shortness of breath and wheezing.   Cardiovascular:  Negative for chest pain and palpitations.  Gastrointestinal:  Negative for abdominal pain, blood in stool and nausea.  Neurological:  Negative for focal weakness.    Patient Active Problem List   Diagnosis Date Noted   AVNRT (AV nodal re-entry tachycardia) 09/03/2015   Osteoporosis 01/22/2015    Allergies  Allergen Reactions   Sulfa Antibiotics Nausea And Vomiting   Zetia [Ezetimibe]     Burning in abdomen    Past Surgical History:  Procedure Laterality Date   BREAST BIOPSY Left 08/05/2020   stereo bx, x clip, calcs, path pending.   BUNIONECTOMY Left    CARDIAC ELECTROPHYSIOLOGY MAPPING AND ABLATION     CARDIAC ELECTROPHYSIOLOGY STUDY AND ABLATION     COLONOSCOPY  2011   cleared for 10 yrs- [facility]   VAGINAL HYSTERECTOMY     partial    Social History   Tobacco Use   Smoking status: Former   Smokeless tobacco: Never  Scientific laboratory technician Use: Never used  Substance Use Topics   Alcohol use: No    Alcohol/week: 0.0 standard drinks of alcohol  Drug use: No     Medication list has been reviewed and updated.  Current Meds  Medication Sig   valACYclovir (VALTREX) 500 MG tablet Take 1 tablet (500 mg total) by mouth 2 (two) times daily.   [DISCONTINUED] amoxicillin (AMOXIL) 500 MG capsule Take 1 capsule (500 mg total) by mouth 3 (three) times daily.       06/01/2022   10:59 AM 10/31/2021    8:48 AM 08/13/2021    8:59 AM 02/14/2021    9:37 AM  GAD 7 : Generalized Anxiety Score  Nervous, Anxious, on Edge 0 0 0 1  Control/stop worrying 0 0 0 1  Worry too much - different things 0 0 0 1  Trouble relaxing 0 0 0 1   Restless 0 0 0 1  Easily annoyed or irritable 0 0 0 1  Afraid - awful might happen 0 0 0 0  Total GAD 7 Score 0 0 0 6  Anxiety Difficulty Not difficult at all Not difficult at all Not difficult at all        06/01/2022   10:59 AM 10/31/2021    8:48 AM 08/13/2021    8:58 AM  Depression screen PHQ 2/9  Decreased Interest 0 0 0  Down, Depressed, Hopeless 0 0 0  PHQ - 2 Score 0 0 0  Altered sleeping 0 0 0  Tired, decreased energy 0 0 0  Change in appetite 0 0 0  Feeling bad or failure about yourself  0 0 0  Trouble concentrating 0 0 0  Moving slowly or fidgety/restless 0 0 0  Suicidal thoughts 0 0 0  PHQ-9 Score 0 0 0  Difficult doing work/chores Not difficult at all Not difficult at all Not difficult at all    BP Readings from Last 3 Encounters:  06/01/22 130/78  10/31/21 100/80  08/13/21 120/68    Physical Exam Vitals and nursing note reviewed. Exam conducted with a chaperone present.  Constitutional:      General: She is not in acute distress.    Appearance: She is not diaphoretic.  HENT:     Head: Normocephalic and atraumatic.     Right Ear: Tympanic membrane and external ear normal.     Left Ear: Tympanic membrane and external ear normal.     Nose: Nose normal.     Mouth/Throat:     Mouth: Mucous membranes are moist.  Eyes:     General:        Right eye: No discharge.        Left eye: No discharge.     Conjunctiva/sclera: Conjunctivae normal.     Pupils: Pupils are equal, round, and reactive to light.  Neck:     Thyroid: No thyroid mass or thyromegaly.     Vascular: Normal carotid pulses. No carotid bruit, hepatojugular reflux or JVD.     Trachea: Trachea normal.  Cardiovascular:     Rate and Rhythm: Normal rate and regular rhythm.     Heart sounds: Normal heart sounds. No murmur heard.    No friction rub. No gallop.  Pulmonary:     Effort: Pulmonary effort is normal.     Breath sounds: Normal breath sounds.  Abdominal:     General: Bowel sounds are  normal.     Palpations: Abdomen is soft. There is no mass.     Tenderness: There is no abdominal tenderness. There is no guarding.  Musculoskeletal:        General: Normal range  of motion.     Cervical back: Normal range of motion and neck supple.  Lymphadenopathy:     Cervical: No cervical adenopathy.  Skin:    General: Skin is warm and dry.  Neurological:     Mental Status: She is alert.     Cranial Nerves: Cranial nerves 2-12 are intact.     Sensory: Sensation is intact.     Motor: Motor function is intact.     Coordination: Romberg sign negative.     Deep Tendon Reflexes: Reflexes are normal and symmetric.     Wt Readings from Last 3 Encounters:  06/01/22 139 lb (63 kg)  10/31/21 143 lb (64.9 kg)  08/13/21 143 lb (64.9 kg)    BP 130/78   Pulse 67   Ht 5' (1.524 m)   Wt 139 lb (63 kg)   SpO2 98%   BMI 27.15 kg/m   Assessment and Plan: 1. Transient vision disturbance New onset.  Lasting only a second or 2.  Uncertain if it was unilateral or bilateral.  Patient was evaluated by eye professional and there was no residual signs noted on examination.  Patient has had a history of lipid noted below.  Patient has had no further episodes but I do not feel like this is a TIA although review of her previous MRI notes microvascular changes consistent with vessel disease. - Lipid Panel With LDL/HDL Ratio - Comprehensive Metabolic Panel (CMET)  2. Familial hypercholesterolemia Chronic.  Persistent.  Stable.  Patient has been reluctant to initiate statin in the past but she is feeling more so in that direction at this time.  Upon lipid panel we will determine whether or not we we will initiate with rosuvastatin 5 mg as our first step. - Lipid Panel With LDL/HDL Ratio  3. Elevated blood pressure affecting pregnancy in first trimester, antepartum Elevated blood pressure readings in the past and the 30.  Given that the MRI suggest that there are microvascular changes we will become  more aggressive to decrease current blood pressure with hydrochlorothiazide 12.5 mg once a day. - hydrochlorothiazide (HYDRODIURIL) 12.5 MG tablet; Take 1 tablet (12.5 mg total) by mouth daily.  Dispense: 90 tablet; Refill: 3     Otilio Miu, MD

## 2022-06-02 LAB — COMPREHENSIVE METABOLIC PANEL
ALT: 14 IU/L (ref 0–32)
AST: 19 IU/L (ref 0–40)
Albumin/Globulin Ratio: 2.1 (ref 1.2–2.2)
Albumin: 4.4 g/dL (ref 3.9–4.9)
Alkaline Phosphatase: 106 IU/L (ref 44–121)
BUN/Creatinine Ratio: 15 (ref 12–28)
BUN: 11 mg/dL (ref 8–27)
Bilirubin Total: 0.3 mg/dL (ref 0.0–1.2)
CO2: 22 mmol/L (ref 20–29)
Calcium: 9.2 mg/dL (ref 8.7–10.3)
Chloride: 105 mmol/L (ref 96–106)
Creatinine, Ser: 0.71 mg/dL (ref 0.57–1.00)
Globulin, Total: 2.1 g/dL (ref 1.5–4.5)
Glucose: 88 mg/dL (ref 70–99)
Potassium: 4.3 mmol/L (ref 3.5–5.2)
Sodium: 141 mmol/L (ref 134–144)
Total Protein: 6.5 g/dL (ref 6.0–8.5)
eGFR: 93 mL/min/{1.73_m2} (ref >59)

## 2022-06-02 LAB — LIPID PANEL WITH LDL/HDL RATIO
Cholesterol, Total: 272 mg/dL — ABNORMAL HIGH (ref 100–199)
HDL: 72 mg/dL (ref >39)
LDL Chol Calc (NIH): 175 mg/dL — ABNORMAL HIGH (ref 0–99)
LDL/HDL Ratio: 2.4 ratio (ref 0.0–3.2)
Triglycerides: 139 mg/dL (ref 0–149)
VLDL Cholesterol Cal: 25 mg/dL (ref 5–40)

## 2022-06-03 ENCOUNTER — Other Ambulatory Visit: Payer: Self-pay

## 2022-06-03 DIAGNOSIS — E7801 Familial hypercholesterolemia: Secondary | ICD-10-CM

## 2022-06-03 MED ORDER — ROSUVASTATIN CALCIUM 5 MG PO TABS: 5.0000 mg | ORAL_TABLET | Freq: Every day | ORAL | 0 refills | Status: DC

## 2022-07-06 ENCOUNTER — Other Ambulatory Visit: Payer: Self-pay | Admitting: Family Medicine

## 2022-07-06 DIAGNOSIS — Z1231 Encounter for screening mammogram for malignant neoplasm of breast: Secondary | ICD-10-CM

## 2022-07-13 ENCOUNTER — Ambulatory Visit: Payer: Medicare HMO | Admitting: Family Medicine

## 2022-08-12 ENCOUNTER — Ambulatory Visit (INDEPENDENT_AMBULATORY_CARE_PROVIDER_SITE_OTHER): Payer: Medicare HMO

## 2022-08-12 VITALS — Ht 60.0 in | Wt 139.0 lb

## 2022-08-12 DIAGNOSIS — Z0000 Encounter for general adult medical examination without abnormal findings: Secondary | ICD-10-CM | POA: Diagnosis not present

## 2022-08-12 NOTE — Patient Instructions (Signed)
[name] , Thank you for taking time to come for your Medicare Wellness Visit. I appreciate your ongoing commitment to your health goals. Please review the following plan we discussed and let me know if I can assist you in the future.   These are the goals we discussed:  Goals      DIET - EAT MORE FRUITS AND VEGETABLES     DIET - INCREASE WATER INTAKE     Recommend drinking 6-8 glasses of water per day         This is a list of the screening recommended for you and due dates:  Health Maintenance  Topic Date Due   COVID-19 Vaccine (5 - 2023-24 season) 11/28/2021   Zoster (Shingles) Vaccine (1 of 2) 09/01/2022*   Pneumonia Vaccine (1 of 2 - PCV) 06/01/2023*   Colon Cancer Screening  06/01/2023*   Hepatitis C Screening: USPSTF Recommendation to screen - Ages 18-79 yo.  06/01/2023*   Flu Shot  10/29/2022   Medicare Annual Wellness Visit  08/12/2023   Mammogram  08/13/2023   DTaP/Tdap/Td vaccine (2 - Td or Tdap) 03/11/2025   DEXA scan (bone density measurement)  Completed   HPV Vaccine  Aged Out  *Topic was postponed. The date shown is not the original due date.    Advanced directives: no  Conditions/risks identified: none  Next appointment: Follow up in one year for your annual wellness visit 08/18/23 @ 8:45 am by phone  Preventive Care 65 Years and Older, Female Preventive care refers to lifestyle choices and visits with your health care provider that can promote health and wellness. What does preventive care include? A yearly physical exam. This is also called an annual well check. Dental exams once or twice a year. Routine eye exams. Ask your health care provider how often you should have your eyes checked. Personal lifestyle choices, including: Daily care of your teeth and gums. Regular physical activity. Eating a healthy diet. Avoiding tobacco and drug use. Limiting alcohol use. Practicing safe sex. Taking low-dose aspirin every day. Taking vitamin and mineral  supplements as recommended by your health care provider. What happens during an annual well check? The services and screenings done by your health care provider during your annual well check will depend on your age, overall health, lifestyle risk factors, and family history of disease. Counseling  Your health care provider may ask you questions about your: Alcohol use. Tobacco use. Drug use. Emotional well-being. Home and relationship well-being. Sexual activity. Eating habits. History of falls. Memory and ability to understand (cognition). Work and work Astronomer. Reproductive health. Screening  You may have the following tests or measurements: Height, weight, and BMI. Blood pressure. Lipid and cholesterol levels. These may be checked every 5 years, or more frequently if you are over 59 years old. Skin check. Lung cancer screening. You may have this screening every year starting at age 69 if you have a 30-pack-year history of smoking and currently smoke or have quit within the past 15 years. Fecal occult blood test (FOBT) of the stool. You may have this test every year starting at age 83. Flexible sigmoidoscopy or colonoscopy. You may have a sigmoidoscopy every 5 years or a colonoscopy every 10 years starting at age 68. Hepatitis C blood test. Hepatitis B blood test. Sexually transmitted disease (STD) testing. Diabetes screening. This is done by checking your blood sugar (glucose) after you have not eaten for a while (fasting). You may have this done every 1-3 years. Bone  density scan. This is done to screen for osteoporosis. You may have this done starting at age 63. Mammogram. This may be done every 1-2 years. Talk to your health care provider about how often you should have regular mammograms. Talk with your health care provider about your test results, treatment options, and if necessary, the need for more tests. Vaccines  Your health care provider may recommend certain  vaccines, such as: Influenza vaccine. This is recommended every year. Tetanus, diphtheria, and acellular pertussis (Tdap, Td) vaccine. You may need a Td booster every 10 years. Zoster vaccine. You may need this after age 5. Pneumococcal 13-valent conjugate (PCV13) vaccine. One dose is recommended after age 33. Pneumococcal polysaccharide (PPSV23) vaccine. One dose is recommended after age 60. Talk to your health care provider about which screenings and vaccines you need and how often you need them. This information is not intended to replace advice given to you by your health care provider. Make sure you discuss any questions you have with your health care provider. Document Released: 04/12/2015 Document Revised: 12/04/2015 Document Reviewed: 01/15/2015 Elsevier Interactive Patient Education  2017 [employer] Prevention in the Home Falls can cause injuries. They can happen to people of all ages. There are many things you can do to make your home safe and to help prevent falls. What can I do on the outside of my home? Regularly fix the edges of walkways and driveways and fix any cracks. Remove anything that might make you trip as you walk through a door, such as a raised step or threshold. Trim any bushes or trees on the path to your home. Use bright outdoor lighting. Clear any walking paths of anything that might make someone trip, such as rocks or tools. Regularly check to see if handrails are loose or broken. Make sure that both sides of any steps have handrails. Any raised decks and porches should have guardrails on the edges. Have any leaves, snow, or ice cleared regularly. Use sand or salt on walking paths during winter. Clean up any spills in your garage right away. This includes oil or grease spills. What can I do in the bathroom? Use night lights. Install grab bars by the toilet and in the tub and shower. Do not use towel bars as grab bars. Use non-skid mats or decals in  the tub or shower. If you need to sit down in the shower, use a plastic, non-slip stool. Keep the floor dry. Clean up any water that spills on the floor as soon as it happens. Remove soap buildup in the tub or shower regularly. Attach bath mats securely with double-sided non-slip rug tape. Do not have throw rugs and other things on the floor that can make you trip. What can I do in the bedroom? Use night lights. Make sure that you have a light by your bed that is easy to reach. Do not use any sheets or blankets that are too big for your bed. They should not hang down onto the floor. Have a firm chair that has side arms. You can use this for support while you get dressed. Do not have throw rugs and other things on the floor that can make you trip. What can I do in the kitchen? Clean up any spills right away. Avoid walking on wet floors. Keep items that you use a lot in easy-to-reach places. If you need to reach something above you, use a strong step stool that has a grab bar. Keep  electrical cords out of the way. Do not use floor polish or wax that makes floors slippery. If you must use wax, use non-skid floor wax. Do not have throw rugs and other things on the floor that can make you trip. What can I do with my stairs? Do not leave any items on the stairs. Make sure that there are handrails on both sides of the stairs and use them. Fix handrails that are broken or loose. Make sure that handrails are as long as the stairways. Check any carpeting to make sure that it is firmly attached to the stairs. Fix any carpet that is loose or worn. Avoid having throw rugs at the top or bottom of the stairs. If you do have throw rugs, attach them to the floor with carpet tape. Make sure that you have a light switch at the top of the stairs and the bottom of the stairs. If you do not have them, ask someone to add them for you. What else can I do to help prevent falls? Wear shoes that: Do not have high  heels. Have rubber bottoms. Are comfortable and fit you well. Are closed at the toe. Do not wear sandals. If you use a stepladder: Make sure that it is fully opened. Do not climb a closed stepladder. Make sure that both sides of the stepladder are locked into place. Ask someone to hold it for you, if possible. Clearly mark and make sure that you can see: Any grab bars or handrails. First and last steps. Where the edge of each step is. Use tools that help you move around (mobility aids) if they are needed. These include: Canes. Walkers. Scooters. Crutches. Turn on the lights when you go into a dark area. Replace any light bulbs as soon as they burn out. Set up your furniture so you have a clear path. Avoid moving your furniture around. If any of your floors are uneven, fix them. If there are any pets around you, be aware of where they are. Review your medicines with your doctor. Some medicines can make you feel dizzy. This can increase your chance of falling. Ask your doctor what other things that you can do to help prevent falls. This information is not intended to replace advice given to you by your health care provider. Make sure you discuss any questions you have with your health care provider. Document Released: 01/10/2009 Document Revised: 08/22/2015 Document Reviewed: 04/20/2014 Elsevier Interactive Patient Education  2017 [employer].

## 2022-08-12 NOTE — Progress Notes (Signed)
I connected with  [name] on 08/12/22 by a audio enabled telemedicine application and verified that I am speaking with the correct person using two identifiers.  Patient Location: Home  Provider Location: Office/Clinic  I discussed the limitations of evaluation and management by telemedicine. The patient expressed understanding and agreed to proceed.  Subjective:   Kirsten Moore is a 67 y.o. female who presents for Medicare Annual (Subsequent) preventive examination.  Review of Systems     Cardiac Risk Factors include: advanced age (>83men, >68 women);hypertension     Objective:    There were no vitals filed for this visit. There is no height or weight on file to calculate BMI.     08/12/2022    8:51 AM 08/06/2021    8:56 AM 03/12/2015    8:47 AM 01/14/2015   11:40 AM 01/14/2015    9:25 AM  Advanced Directives  Does Patient Have a Medical Advance Directive? No Yes No No No  Type of Special educational needs teacher of [place];Living will     Copy of Healthcare Power of Attorney in Chart?  No - copy requested     Would patient like information on creating a medical advance directive? No - Patient declined  No - patient declined information  No - patient declined information    Current Medications (verified) Outpatient Encounter Medications as of 08/12/2022  Medication Sig   hydrochlorothiazide (HYDRODIURIL) 12.5 MG tablet Take 1 tablet (12.5 mg total) by mouth daily.   rosuvastatin (CRESTOR) 5 MG tablet Take 1 tablet (5 mg total) by mouth daily.   valACYclovir (VALTREX) 500 MG tablet Take 1 tablet (500 mg total) by mouth 2 (two) times daily.   No facility-administered encounter medications on file as of 08/12/2022.    Allergies (verified) Sulfa antibiotics and Zetia [ezetimibe]   History: Past Medical History:  Diagnosis Date   Hypertension    Past Surgical History:  Procedure Laterality Date   BREAST BIOPSY Left 08/05/2020   stereo bx, x clip,  calcs, path pending.   BUNIONECTOMY Left    CARDIAC ELECTROPHYSIOLOGY MAPPING AND ABLATION     CARDIAC ELECTROPHYSIOLOGY STUDY AND ABLATION     COLONOSCOPY  2011   cleared for 10 yrs- [facility]   VAGINAL HYSTERECTOMY     partial   Family History  Problem Relation Age of Onset   Breast cancer Other 66   Breast cancer Mother 48   Breast cancer Sister 58   Social History   Socioeconomic History   Marital status: Widowed    Spouse name: Not on file   Number of children: 2   Years of education: Not on file   Highest education level: Not on file  Occupational History   Not on file  Tobacco Use   Smoking status: Former   Smokeless tobacco: Never  Vaping Use   Vaping Use: Never used  Substance and Sexual Activity   Alcohol use: No    Alcohol/week: 0.0 standard drinks of alcohol   Drug use: No   Sexual activity: Not Currently  Other Topics Concern   Not on file  Social History Narrative   Pt lives alone   Social Determinants of Health   Financial Resource Strain: Low Risk  (08/12/2022)   Overall Financial Resource Strain (CARDIA)    Difficulty of Paying Living Expenses: Not hard at all  Food Insecurity: No Food Insecurity (08/12/2022)   Hunger Vital Sign    Worried About Running Out of Food  in the Last Year: Never true    Ran Out of Food in the Last Year: Never true  Transportation Needs: No Transportation Needs (08/12/2022)   PRAPARE - Administrator, Civil Service (Medical): No    Lack of Transportation (Non-Medical): No  Physical Activity: Insufficiently Active (08/12/2022)   Exercise Vital Sign    Days of Exercise per Week: 2 days    Minutes of Exercise per Session: 20 min  Stress: No Stress Concern Present (08/12/2022)   [employer] of Occupational Health - Occupational Stress Questionnaire    Feeling of Stress : Not at all  Social Connections: Socially Isolated (08/12/2022)   Social Connection and Isolation Panel [NHANES]    Frequency of  Communication with Friends and Family: More than three times a week    Frequency of Social Gatherings with Friends and Family: More than three times a week    Attends Religious Services: Never    Database administrator or Organizations: No    Attends Banker Meetings: Never    Marital Status: Widowed    Tobacco Counseling Counseling given: Not Answered   Clinical Intake:  Pre-visit preparation completed: Yes  Pain : No/denies pain     Nutritional Risks: None Diabetes: No  How often do you need to have someone help you when you read instructions, pamphlets, or other written materials from your doctor or pharmacy?: 1 - Never  Diabetic?no  Interpreter Needed?: No  Information entered by :: Kennedy Bucker, LPN   Activities of Daily Living    08/12/2022    8:52 AM  In your present state of health, do you have any difficulty performing the following activities:  Hearing? 0  Vision? 0  Difficulty concentrating or making decisions? 0  Walking or climbing stairs? 0  Dressing or bathing? 0  Doing errands, shopping? 0  Preparing Food and eating ? N  Using the Toilet? N  In the past six months, have you accidently leaked urine? N  Do you have problems with loss of bowel control? N  Managing your Medications? N  Managing your Finances? N  Housekeeping or managing your Housekeeping? N    Patient Care Team: [name], MD as PCP - General (Family Medicine)  Indicate any recent Medical Services you may have received from other than [facility] providers in the past year (date may be approximate).     Assessment:   This is a routine wellness examination for [name]  Hearing/Vision screen Hearing Screening - Comments:: No aids Vision Screening - Comments:: Wears glasses- [name]  Dietary issues and exercise activities discussed: Current Exercise Habits: Home exercise routine, Type of exercise: walking, Time (Minutes): 20, Frequency (Times/Week): 2, Weekly  Exercise (Minutes/Week): 40, Intensity: Mild   Goals Addressed             This Visit's Progress    DIET - EAT MORE FRUITS AND VEGETABLES         Depression Screen    08/12/2022    8:50 AM 06/01/2022   10:59 AM 10/31/2021    8:48 AM 08/13/2021    8:58 AM 08/06/2021    8:54 AM 02/14/2021    9:37 AM 06/12/2020    9:55 AM  PHQ 2/9 Scores  PHQ - 2 Score 0 0 0 0 2 2 0  PHQ- 9 Score 0 0 0 0 4 4 0    Fall Risk    08/12/2022    8:51 AM 06/01/2022  10:59 AM 10/31/2021    8:48 AM 08/06/2021    8:58 AM 06/12/2020    9:55 AM  Fall Risk   Falls in the past year? 0 0 1 0 0  Number falls in past yr: 0 0 0 0   Injury with Fall? 0 0 0 0   Risk for fall due to : No Fall Risks No Fall Risks No Fall Risks No Fall Risks   Follow up Falls prevention discussed;Falls evaluation completed Falls evaluation completed Falls evaluation completed Falls prevention discussed Falls evaluation completed    FALL RISK PREVENTION PERTAINING TO THE HOME:  Any stairs in or around the home? Yes  If so, are there any without handrails? No  Home free of loose throw rugs in walkways, pet beds, electrical cords, etc? Yes  Adequate lighting in your home to reduce risk of falls? Yes   ASSISTIVE DEVICES UTILIZED TO PREVENT FALLS:  Life alert? No  Use of a cane, walker or w/c? No  Grab bars in the bathroom? Yes  Shower chair or bench in shower? No  Elevated toilet seat or a handicapped toilet? No   Cognitive Function:        08/12/2022    8:56 AM  6CIT Screen  What Year? 0 points  What month? 0 points  What time? 0 points  Count back from 20 0 points  Months in reverse 0 points  Repeat phrase 0 points  Total Score 0 points    Immunizations Immunization History  Administered Date(s) Administered   Fluad Quad(high Dose 65+) 02/08/2019, 02/14/2021   Influenza,inj,Quad PF,6+ Mos 03/12/2015, 01/27/2017, 04/04/2018   Moderna Sars-Covid-2 Vaccination 05/04/2019, 06/01/2019, 01/30/2020, 12/12/2020   Tdap  03/12/2015    TDAP status: Up to date  Flu Vaccine status: Declined, Education has been provided regarding the importance of this vaccine but patient still declined. Advised may receive this vaccine at local pharmacy or Health Dept. Aware to provide a copy of the vaccination record if obtained from local pharmacy or Health Dept. Verbalized acceptance and understanding.  Pneumococcal vaccine status: Declined,  Education has been provided regarding the importance of this vaccine but patient still declined. Advised may receive this vaccine at local pharmacy or Health Dept. Aware to provide a copy of the vaccination record if obtained from local pharmacy or Health Dept. Verbalized acceptance and understanding.   Covid-19 vaccine status: Completed vaccines  Qualifies for Shingles Vaccine? Yes   Zostavax completed No   Shingrix Completed?: No.    Education has been provided regarding the importance of this vaccine. Patient has been advised to call insurance company to determine out of pocket expense if they have not yet received this vaccine. Advised may also receive vaccine at local pharmacy or Health Dept. Verbalized acceptance and understanding.  Screening Tests Health Maintenance  Topic Date Due   COVID-19 Vaccine (5 - 2023-24 season) 11/28/2021   Zoster Vaccines- Shingrix (1 of 2) 09/01/2022 (Originally 10/20/2003)   Pneumonia Vaccine 68+ Years old (1 of 2 - PCV) 06/01/2023 (Originally 10/20/1961)   COLONOSCOPY (Pts 45-27yrs Insurance coverage will need to be confirmed)  06/01/2023 (Originally 03/28/2020)   Hepatitis C Screening  06/01/2023 (Originally 10/20/1973)   INFLUENZA VACCINE  10/29/2022   Medicare Annual Wellness (AWV)  08/12/2023   MAMMOGRAM  08/13/2023   DTaP/Tdap/Td (2 - Td or Tdap) 03/11/2025   DEXA SCAN  Completed   HPV VACCINES  Aged Out    Health Maintenance  Health Maintenance Due  Topic Date Due  COVID-19 Vaccine (5 - 2023-24 season) 11/28/2021    Colorectal  cancer screening: Type of screening: Colonoscopy. Completed postponed until 06/01/23. Repeat every 10 years  Mammogram status: Completed scheduled 08/17/22. Repeat every year  Bone Density status: Completed 03/26/15. Results reflect: Bone density results: OSTEOPOROSIS. Repeat every 2 years.- pt declined referral  Lung Cancer Screening: (Low Dose CT Chest recommended if Age 42-80 years, 30 pack-year currently smoking OR have quit w/in 15years.) does not qualify.   Additional Screening:  Hepatitis C Screening: does qualify; Completed no  Vision Screening: Recommended annual ophthalmology exams for early detection of glaucoma and other disorders of the eye. Is the patient up to date with their annual eye exam?  Yes  Who is the provider or what is the name of the office in which the patient attends annual eye exams? [facility] If pt is not established with a provider, would they like to be referred to a provider to establish care? No .   Dental Screening: Recommended annual dental exams for proper oral hygiene  Community Resource Referral / Chronic Care Management: CRR required this visit?  No   CCM required this visit?  No      Plan:     I have personally reviewed and noted the following in the patient's chart:   Medical and social history Use of alcohol, tobacco or illicit drugs  Current medications and supplements including opioid prescriptions. Patient is not currently taking opioid prescriptions. Functional ability and status Nutritional status Physical activity Advanced directives List of other physicians Hospitalizations, surgeries, and ER visits in previous 12 months Vitals Screenings to include cognitive, depression, and falls Referrals and appointments  In addition, I have reviewed and discussed with patient certain preventive protocols, quality metrics, and best practice recommendations. A written personalized care plan for preventive services as well as general  preventive health recommendations were provided to patient.     Hal Hope, LPN   07/06/8117   Nurse Notes: none

## 2022-08-16 DIAGNOSIS — A6004 Herpesviral vulvovaginitis: Secondary | ICD-10-CM | POA: Insufficient documentation

## 2022-08-16 DIAGNOSIS — R079 Chest pain, unspecified: Secondary | ICD-10-CM | POA: Diagnosis not present

## 2022-08-16 DIAGNOSIS — E785 Hyperlipidemia, unspecified: Secondary | ICD-10-CM | POA: Insufficient documentation

## 2022-08-16 DIAGNOSIS — Z0189 Encounter for other specified special examinations: Secondary | ICD-10-CM | POA: Diagnosis not present

## 2022-08-16 DIAGNOSIS — I2111 ST elevation (STEMI) myocardial infarction involving right coronary artery: Secondary | ICD-10-CM | POA: Insufficient documentation

## 2022-08-16 DIAGNOSIS — Z955 Presence of coronary angioplasty implant and graft: Secondary | ICD-10-CM | POA: Insufficient documentation

## 2022-08-16 DIAGNOSIS — Z9861 Coronary angioplasty status: Secondary | ICD-10-CM | POA: Diagnosis not present

## 2022-08-16 DIAGNOSIS — Z1152 Encounter for screening for COVID-19: Secondary | ICD-10-CM | POA: Diagnosis not present

## 2022-08-16 DIAGNOSIS — R000 Tachycardia, unspecified: Secondary | ICD-10-CM | POA: Diagnosis not present

## 2022-08-16 DIAGNOSIS — I10 Essential (primary) hypertension: Secondary | ICD-10-CM | POA: Diagnosis not present

## 2022-08-16 DIAGNOSIS — I2119 ST elevation (STEMI) myocardial infarction involving other coronary artery of inferior wall: Secondary | ICD-10-CM | POA: Diagnosis not present

## 2022-08-16 DIAGNOSIS — R0789 Other chest pain: Secondary | ICD-10-CM | POA: Diagnosis not present

## 2022-08-16 DIAGNOSIS — Z87891 Personal history of nicotine dependence: Secondary | ICD-10-CM | POA: Diagnosis not present

## 2022-08-16 DIAGNOSIS — Z882 Allergy status to sulfonamides status: Secondary | ICD-10-CM | POA: Diagnosis not present

## 2022-08-16 DIAGNOSIS — I213 ST elevation (STEMI) myocardial infarction of unspecified site: Secondary | ICD-10-CM | POA: Diagnosis not present

## 2022-08-16 DIAGNOSIS — I2510 Atherosclerotic heart disease of native coronary artery without angina pectoris: Secondary | ICD-10-CM | POA: Diagnosis not present

## 2022-08-17 ENCOUNTER — Encounter: Payer: Medicare HMO | Admitting: Family Medicine

## 2022-08-17 ENCOUNTER — Ambulatory Visit: Payer: Medicare HMO

## 2022-08-17 ENCOUNTER — Telehealth: Payer: Self-pay | Admitting: Family Medicine

## 2022-08-17 NOTE — Telephone Encounter (Signed)
Copied from CRM [id number]. Topic: General - Inquiry >> Aug 17, 2022 11:33 AM Pincus Sanes wrote: Reason for CRM: Pls call pt and verify if had pneumonia shot, does not think she does, not seeing where would normally be, Marylene Land was going to ask Delice Bison. Pt is in hospital and was going to get if had not had. Pt states room has no phone call bk at [phone].

## 2022-08-18 DIAGNOSIS — Z9861 Coronary angioplasty status: Secondary | ICD-10-CM | POA: Diagnosis not present

## 2022-08-18 DIAGNOSIS — I213 ST elevation (STEMI) myocardial infarction of unspecified site: Secondary | ICD-10-CM | POA: Diagnosis not present

## 2022-08-18 DIAGNOSIS — I2111 ST elevation (STEMI) myocardial infarction involving right coronary artery: Secondary | ICD-10-CM | POA: Diagnosis not present

## 2022-08-20 ENCOUNTER — Telehealth: Payer: Self-pay | Admitting: Pharmacist

## 2022-08-20 ENCOUNTER — Telehealth: Payer: Self-pay | Admitting: *Deleted

## 2022-08-20 DIAGNOSIS — I213 ST elevation (STEMI) myocardial infarction of unspecified site: Secondary | ICD-10-CM | POA: Diagnosis not present

## 2022-08-20 DIAGNOSIS — I214 Non-ST elevation (NSTEMI) myocardial infarction: Secondary | ICD-10-CM

## 2022-08-20 NOTE — Transitions of Care (Post Inpatient/ED Visit) (Signed)
08/20/2022  Name: Kirsten Moore MRN: [id number] DOB: 10-20-1955  Today's TOC FU Call Status: Today's TOC FU Call Status:: Successful TOC FU Call Competed TOC FU Call Complete Date: 08/20/22  Transition Care Management Follow-up Telephone Call Date of Discharge: 08/18/22 Discharge Facility: Other (Non-[facility] Facility) Name of Other ([facility]) Discharge Facility: [facility] Type of Discharge: Inpatient Admission Primary Inpatient Discharge Diagnosis:: ST elevation myocardial infarction How have you been since you were released from the hospital?: Better Any questions or concerns?: Yes Patient Questions/Concerns:: #1Patient is taking Losartan 12.5mg  They only come in 25 mg. The are enteric coated and does not cut in half evenly.  She has called multiple pharmacies and they do not have 12.5 mg.  #2 Patient is on Brilinta and the cost is very high. Are their any scholarship programs available. Patient Questions/Concerns Addressed: Other: (referred to [facility] pharmacy)  Items Reviewed: Did you receive and understand the discharge instructions provided?: Yes Medications obtained,verified, and reconciled?: Yes (Medications Reviewed) Any new allergies since your discharge?: No Dietary orders reviewed?: No Do you have support at home?: Yes People in Home: alone Name of Support/Comfort Primary Source: Sue Lush  Medications Reviewed Today: Medications Reviewed Today     Reviewed by Luella Cook, RN (Case Manager) on 08/20/22 at 1210  Med List Status: <None>   Medication Order Taking? Sig Documenting Provider Last Dose Status Informant  hydrochlorothiazide (HYDRODIURIL) 12.5 MG tablet [id number] Yes Take 1 tablet (12.5 mg total) by mouth daily. [name], MD Taking Active   rosuvastatin (CRESTOR) 5 MG tablet [id number] Yes Take 1 tablet (5 mg total) by mouth daily. [name], MD Taking Active   valACYclovir (VALTREX) 500 MG tablet [id number] Yes Take 1 tablet (500 mg total) by mouth 2  (two) times daily. [name], MD Taking Active             Home Care and Equipment/Supplies: Were Home Health Services Ordered?: NA Any new equipment or medical supplies ordered?: NA  Functional Questionnaire: Do you need assistance with bathing/showering or dressing?: No Do you need assistance with meal preparation?: No Do you need assistance with eating?: No Do you have difficulty maintaining continence: No Do you have difficulty managing or taking your medications?: No  Follow up appointments reviewed: PCP Follow-up appointment confirmed?: Yes Date of PCP follow-up appointment?: 08/27/22 Follow-up Provider: Dr Elizabeth Sauer Specialist [facility] Follow-up appointment confirmed?: NA Do you need transportation to your follow-up appointment?: No Do you understand care options if your condition(s) worsen?: Yes-patient verbalized understanding   Interventions Today    Flowsheet Row Most Recent Value  General Interventions   General Interventions Discussed/Reviewed General Interventions Discussed, General Interventions Reviewed, Doctor Visits, Referral to Nurse  Freada Bergeron to [facility]  RN Care Coordination]  Doctor Visits Discussed/Reviewed Doctor Visits Discussed, Doctor Visits Reviewed  Pharmacy Interventions   Pharmacy Dicussed/Reviewed Pharmacy Topics Discussed, Pharmacy Topics Reviewed, Referral to Pharmacist, Affording Medications  Referral to Pharmacist Cannot afford medications  [#1Pt  taking Losartan 12.5mg  Come in 25 mg. They are enteric coated, does not cut in half evenly. She called multiple pharmacies and they do not have 12.5 mg. #2 Pt is on Brilinta and the cost is very high. Are their any assistance  available.]      TOC Interventions Today    Flowsheet Row Most Recent Value  TOC Interventions   TOC Interventions Discussed/Reviewed TOC Interventions Discussed, TOC Interventions Reviewed      Referred to Kemper Durie RN for Coordination appt  [id number]  3:00  Gean Maidens BSN RN [facility] Care Management [phone]

## 2022-08-20 NOTE — Progress Notes (Signed)
[facility] Customer service manager [facility])  [facility] Quality Pharmacy Team   08/20/2022  Kirsten Moore [birth year] [id number]  Reason for referral:  1. Medication assistance for Brilinta  2. Difficulty splitting losartan 25 mg tablets   Referral source: [facility] RN Current insurance: [employer]  Outreach:  Successful telephone call with Kirsten Moore.  HIPAA identifiers verified.   Losartan 12.5 mg dosage/pill splitting: Kirsten Moore states that she is currently splitting a losartan 25 mg tablet in 1/2 with a pill splitter. She expresses difficulty with this as the tablets are enteric coated and small. She also states that she is a "dizzy" feeling in the AM on waking. She reports this resolves later in the  day.  I have discussed with patient that losartan only comes in the 25 mg, 50 mg and 100 mg dosage forms. I have advised Kirsten Moore to monitor and log her blood pressure and heart rate in the AM and PM and take the log with her to her PCP office on 08/27/2022. I have advised Kirsten Moore that metoprolol 25 mg IR and ER both come in a 12.5 mg dosage that would not require her to split the dose. I have advised patient to discuss with her PCP at the 08/17/2022 appointment optimizing her medication regimen based on her blood pressure and heart rate readings. I also informed her that if her symptoms worsen prior to the 08/17/2022 office visit with her PCP, call or message the office. Patient verbalized understanding.   Medication Assistance Findings:  Per review of qualifications for patient assistance with Brilinta, patient reports she exceeds the annual income threshold to qualify.  I have made Kirsten Moore aware that generic Plavix 75 mg is $0 on her drug formulary if she needs to have er PCP make a change in her therapy. Patient prefers to take Brilinta as this is the preferred medication recommended to her by prescriber. Patient states she will remain on Brilinta at this time.   Extra Help:  Not eligible for Extra  Help Low Income Subsidy based on reported income and assets  Additional medication assistance options reviewed with patient as warranted:  No other options identified  I have provided my contact information should patient have medication cost issues or medication questions in the future. Patient was appreciative of my call.    Plan: Will route note to Gean Maidens, RN. Will close [facility] pharmacy case as no further medication needs identified at this time.  Am happy to assist in the future as needed.   Thank you for allowing [facility] pharmacy to be a part of this patient's care.   Reynold Bowen, PharmD [facility]  [facility] Clinical Pharmacist Direct Dial: [phone]

## 2022-08-27 ENCOUNTER — Ambulatory Visit (INDEPENDENT_AMBULATORY_CARE_PROVIDER_SITE_OTHER): Payer: Medicare HMO | Admitting: Family Medicine

## 2022-08-27 ENCOUNTER — Encounter: Payer: Self-pay | Admitting: Family Medicine

## 2022-08-27 VITALS — BP 128/82 | HR 70 | Ht 60.0 in | Wt 136.0 lb

## 2022-08-27 DIAGNOSIS — I10 Essential (primary) hypertension: Secondary | ICD-10-CM

## 2022-08-27 DIAGNOSIS — E7801 Familial hypercholesterolemia: Secondary | ICD-10-CM | POA: Diagnosis not present

## 2022-08-27 DIAGNOSIS — I2510 Atherosclerotic heart disease of native coronary artery without angina pectoris: Secondary | ICD-10-CM | POA: Diagnosis not present

## 2022-08-27 MED ORDER — LOSARTAN POTASSIUM 25 MG PO TABS
25.0000 mg | ORAL_TABLET | Freq: Every day | ORAL | 0 refills | Status: DC
Start: 2022-08-27 — End: 2022-11-26

## 2022-08-27 NOTE — Progress Notes (Signed)
Date:  08/27/2022   Name:  Kirsten Moore   DOB:  05-17-1955   MRN:  [id number]   Chief Complaint: Hospitalization Follow-up (Admitted on 05/19- discharged on 08/18/22. TOC placed on 08/20/2022. Has follow up with cardio in July)  Follow up Hospitalization  Patient was admitted to [facility] on 5/19 and discharged on 5/21.Marland Kitchen She was treated for STEMI. Treatment for this included cath/2 stents. Telephone follow up was done on 5/22 She reports excellent compliance with treatment. She reports this condition is improved.  ----------------------------------------------------------------------------------------- -     Hypertension This is a chronic problem. The current episode started more than 1 year ago. The problem has been gradually improving since onset. The problem is controlled. Pertinent negatives include no chest pain, headaches, orthopnea, palpitations, PND or shortness of breath. There are no associated agents to hypertension. Risk factors for coronary artery disease include dyslipidemia. Past treatments include ACE inhibitors and beta blockers. The current treatment provides moderate improvement. There are no compliance problems.  Hypertensive end-organ damage includes CAD/MI. There is no history of angina or CVA. There is no history of chronic renal disease, a hypertension causing med or renovascular disease.    Lab Results  Component Value Date   NA 141 06/01/2022   K 4.3 06/01/2022   CO2 22 06/01/2022   GLUCOSE 88 06/01/2022   BUN 11 06/01/2022   CREATININE 0.71 06/01/2022   CALCIUM 9.2 06/01/2022   EGFR 93 06/01/2022   GFRNONAA 76 04/19/2019   Lab Results  Component Value Date   CHOL 272 (H) 06/01/2022   HDL 72 06/01/2022   LDLCALC 175 (H) 06/01/2022   TRIG 139 06/01/2022   CHOLHDL 3.5 04/04/2018   No results found for: "TSH" No results found for: "HGBA1C" Lab Results  Component Value Date   WBC 7.7 08/13/2021   HGB 14.1 08/13/2021   HCT 41.1  08/13/2021   MCV 97 08/13/2021   PLT 311 08/13/2021   Lab Results  Component Value Date   ALT 14 06/01/2022   AST 19 06/01/2022   ALKPHOS 106 06/01/2022   BILITOT 0.3 06/01/2022   No results found for: "25OHVITD2", "25OHVITD3", "VD25OH"   Review of Systems  Constitutional: Negative.  Negative for chills, fatigue, fever and unexpected weight change.  HENT:  Negative for congestion, ear discharge, ear pain, rhinorrhea, sinus pressure, sneezing and sore throat.   Respiratory:  Negative for cough, chest tightness, shortness of breath, wheezing and stridor.   Cardiovascular:  Negative for chest pain, palpitations, orthopnea, leg swelling and PND.  Gastrointestinal:  Negative for abdominal pain, blood in stool, constipation, diarrhea and nausea.  Genitourinary:  Negative for dysuria, flank pain, frequency, hematuria, urgency and vaginal discharge.  Musculoskeletal:  Negative for arthralgias, back pain and myalgias.  Skin:  Negative for rash.  Neurological:  Negative for dizziness, weakness and headaches.  Hematological:  Negative for adenopathy. Does not bruise/bleed easily.  Psychiatric/Behavioral:  Negative for dysphoric mood. The patient is not nervous/anxious.     Patient Active Problem List   Diagnosis Date Noted   AVNRT (AV nodal re-entry tachycardia) 09/03/2015   Osteoporosis 01/22/2015    Allergies  Allergen Reactions   Sulfa Antibiotics Nausea And Vomiting   Zetia [Ezetimibe]     Burning in abdomen    Past Surgical History:  Procedure Laterality Date   BREAST BIOPSY Left 08/05/2020   stereo bx, x clip, calcs, path pending.   BUNIONECTOMY Left    CARDIAC ELECTROPHYSIOLOGY MAPPING AND ABLATION  CARDIAC ELECTROPHYSIOLOGY STUDY AND ABLATION     COLONOSCOPY  2011   cleared for 10 yrs- [facility]   VAGINAL HYSTERECTOMY     partial    Social History   Tobacco Use   Smoking status: Former   Smokeless tobacco: Never  Building services engineer Use: Never used  Substance  Use Topics   Alcohol use: No    Alcohol/week: 0.0 standard drinks of alcohol   Drug use: No     Medication list has been reviewed and updated.  Current Meds  Medication Sig   aspirin EC 81 MG tablet Take 81 mg by mouth daily. Swallow whole.   losartan (COZAAR) 25 MG tablet Take 0.5 tablets by mouth daily.   metoprolol succinate (TOPROL-XL) 25 MG 24 hr tablet Take 1 tablet by mouth daily.   rosuvastatin (CRESTOR) 20 MG tablet Take 1 tablet by mouth daily.   ticagrelor (BRILINTA) 90 MG TABS tablet Take 90 mg by mouth 2 (two) times daily.   valACYclovir (VALTREX) 500 MG tablet Take 1 tablet (500 mg total) by mouth 2 (two) times daily.       08/27/2022    2:07 PM 06/01/2022   10:59 AM 10/31/2021    8:48 AM 08/13/2021    8:59 AM  GAD 7 : Generalized Anxiety Score  Nervous, Anxious, on Edge 0 0 0 0  Control/stop worrying 0 0 0 0  Worry too much - different things 0 0 0 0  Trouble relaxing 0 0 0 0  Restless 0 0 0 0  Easily annoyed or irritable 0 0 0 0  Afraid - awful might happen 0 0 0 0  Total GAD 7 Score 0 0 0 0  Anxiety Difficulty Not difficult at all Not difficult at all Not difficult at all Not difficult at all       08/27/2022    2:07 PM 08/12/2022    8:50 AM 06/01/2022   10:59 AM  Depression screen PHQ 2/9  Decreased Interest 0 0 0  Down, Depressed, Hopeless 0 0 0  PHQ - 2 Score 0 0 0  Altered sleeping 0 0 0  Tired, decreased energy 0 0 0  Change in appetite 0 0 0  Feeling bad or failure about yourself  0 0 0  Trouble concentrating 0 0 0  Moving slowly or fidgety/restless 0 0 0  Suicidal thoughts 0 0 0  PHQ-9 Score 0 0 0  Difficult doing work/chores Not difficult at all Not difficult at all Not difficult at all    BP Readings from Last 3 Encounters:  08/27/22 128/82  06/01/22 130/78  10/31/21 100/80    Physical Exam Vitals and nursing note reviewed.  HENT:     Right Ear: Tympanic membrane normal.     Left Ear: Tympanic membrane normal.  Eyes:     Extraocular  Movements: Extraocular movements intact.     Pupils: Pupils are equal, round, and reactive to light.  Cardiovascular:     Rate and Rhythm: Normal rate and regular rhythm.     Heart sounds: S1 normal and S2 normal. No murmur heard.    No systolic murmur is present.     No diastolic murmur is present.     No friction rub. No gallop. No S3 or S4 sounds.  Pulmonary:     Breath sounds: No wheezing, rhonchi or rales.  Abdominal:     Tenderness: There is no abdominal tenderness. There is no guarding or rebound.  Musculoskeletal:        General: Normal range of motion.     Cervical back: Normal range of motion.     Right lower leg: No edema.     Left lower leg: No edema.  Skin:    General: Skin is warm.     Findings: No bruising.  Neurological:     Mental Status: She is alert.    Wt Readings from Last 3 Encounters:  08/27/22 136 lb (61.7 kg)  08/12/22 139 lb (63 kg)  06/01/22 139 lb (63 kg)    BP 128/82   Pulse 70   Ht 5' (1.524 m)   Wt 136 lb (61.7 kg)   SpO2 96%   BMI 26.56 kg/m  [facility] AT [facility]                                   [facility] Discharge Acute Issues Care Follow Up                                                                        Patient Demographics  Kamdyn Ruddy, is a 67 y.o. female  DOB Sep 25, 1955  MRN [id number].  Primary MD  [name], MD   Reason for TCC follow Up -follow-up on blood pressure and medication adjustments accordingly.   Past Medical History:  Diagnosis Date   Hypertension     Past Surgical History:  Procedure Laterality Date   BREAST BIOPSY Left 08/05/2020   stereo bx, x clip, calcs, path pending.   BUNIONECTOMY Left    CARDIAC ELECTROPHYSIOLOGY MAPPING AND ABLATION     CARDIAC ELECTROPHYSIOLOGY STUDY AND ABLATION     COLONOSCOPY  2011   cleared for 10 yrs- [facility]   VAGINAL HYSTERECTOMY      partial   Recent HPI and Hospital course: Patient with history of hypertension was on hydrochlorothiazide presented to hospital emergency room with perceived heartburn it was noted that patient had elevated troponins and EKG consistent with STEMI.  Patient hospitalized with catheterization resulting in stenting up to coronary arteries with an upcoming stenting of the third.  Post Hospital acute care issues to be followed in clinic: Blood pressure monitoring was recently in discharged on metoprolol XL 25 mg and losartan 12.5 mg but is unable to maintain this and that she cannot adequately divide the losartan 25 mg tablet.  Patient is doing well with blood readings and at 128/82 and we will proceed with taking 25 mg tablet in its entirety and will recheck with cardiology.     Subjective:   Jaquese Kjar today has, No headache, No chest pain, No abdominal pain - No Nausea, No new weakness tingling or numbness, No Cough - SOB.  Patient denies any DOE, orthopnea, or not nocturia/PND.  Assessment & Plan    1. Coronary artery disease involving native coronary artery of native heart without angina pectoris Patient with a recent admission to [facility] with chest pain and resultant STEMI.  Status post catheterization with stenting of 2 coronary arteries.  Patient  will be followed by cardiology. - Amb Referral to Cardiac Rehabilitation - losartan (COZAAR) 25 MG tablet; Take 1 tablet (25 mg total) by mouth daily.  Dispense: 30 tablet; Refill: 0  2. Primary hypertension Relatively recent onset initially treated with hydrochlorothiazide with control.  However patient presented to emergency room with heartburn which was subsequently noted to be a STEMI.  Patient underwent treatment for this and was discharged on antihypertensive tension regimen of losartan 12.5 mg once a day and metoprolol XL 25 mg once a day.  Patient was unable to adequately divide the 25 mg to 12.5 mg with her pill cutter (crushes  the pill) and we we will increase to 25 mg and that her blood pressure is at 128/82 and would probably be able to tolerate a full dosing. - losartan (COZAAR) 25 MG tablet; Take 1 tablet (25 mg total) by mouth daily.  Dispense: 30 tablet; Refill: 0  3. Familial hypercholesterolemia Chronic.  Controlled.  Stable.  Currently is placed on Crestor 20 mg once a day.  Will continue and will recheck after a few weeks on medication and dietary surveillance.  Reason for frequent admissions/ER visits patient does not have frequent visits.      Objective:   Vitals:   08/27/22 1356  BP: 128/82  Pulse: 70  SpO2: 96%  Weight: 136 lb (61.7 kg)  Height: 5' (1.524 m)    Wt Readings from Last 3 Encounters:  08/27/22 136 lb (61.7 kg)  08/12/22 139 lb (63 kg)  06/01/22 139 lb (63 kg)    Allergies as of 08/27/2022       Reactions   Sulfa Antibiotics Nausea And Vomiting   Zetia [ezetimibe]    Burning in abdomen        Medication List        Accurate as of Aug 27, 2022  2:34 PM. If you have any questions, ask your nurse or doctor.          STOP taking these medications    hydrochlorothiazide 12.5 MG tablet Commonly known as: HYDRODIURIL Stopped by: Elizabeth Sauer, MD       TAKE these medications    aspirin EC 81 MG tablet Take 81 mg by mouth daily. Swallow whole.   losartan 25 MG tablet Commonly known as: COZAAR Take 0.5 tablets by mouth daily.   metoprolol succinate 25 MG 24 hr tablet Commonly known as: TOPROL-XL Take 1 tablet by mouth daily.   rosuvastatin 20 MG tablet Commonly known as: CRESTOR Take 1 tablet by mouth daily. What changed: Another medication with the same name was removed. Continue taking this medication, and follow the directions you see here. Changed by: Elizabeth Sauer, MD   ticagrelor 90 MG Tabs tablet Commonly known as: BRILINTA Take 90 mg by mouth 2 (two) times daily.   valACYclovir 500 MG tablet Commonly known as: Valtrex Take 1 tablet (500  mg total) by mouth 2 (two) times daily.         Physical Exam: Constitutional: Patient appears well-developed and well-nourished. Not in obvious distress. HENT: Normocephalic, atraumatic, External right and left ear normal. Oropharynx is clear and moist.  Eyes: Conjunctivae and EOM are normal. PERRLA, no scleral icterus. Neck: Normal ROM. Neck supple. No JVD. No tracheal deviation. No thyromegaly. CVS: RRR, S1/S2 +, no murmurs, no gallops, no carotid bruit.  Pulmonary: Effort and breath sounds normal, no stridor, rhonchi, wheezes, rales.  Abdominal: Soft. BS +, no distension, tenderness, rebound or guarding.  Musculoskeletal: Normal range  of motion. No edema and no tenderness.  Lymphadenopathy: No lymphadenopathy noted, cervical, inguinal or axillary Neuro: Alert. Normal reflexes, muscle tone coordination. No cranial nerve deficit. Skin: Skin is warm and dry. No rash noted. Not diaphoretic. No erythema. No pallor. Psychiatric: Normal mood and affect. Behavior, judgment, thought content normal.   Data Review   Micro Results No results found for this or any previous visit (from the past 240 hour(s)).   CBC No results for input(s): "WBC", "HGB", "HCT", "PLT", "MCV", "MCH", "MCHC", "RDW", "LYMPHSABS", "MONOABS", "EOSABS", "BASOSABS", "BANDABS" in the last 168 hours.  Invalid input(s): "NEUTRABS", "BANDSABD"  Chemistries  No results for input(s): "NA", "K", "CL", "CO2", "GLUCOSE", "BUN", "CREATININE", "CALCIUM", "MG", "AST", "ALT", "ALKPHOS", "BILITOT" in the last 168 hours.  Invalid input(s): "GFRCGP" ------------------------------------------------------------------------------------------------------------------ CrCl cannot be calculated (Patient's most recent lab result is older than the maximum 21 days allowed.). ------------------------------------------------------------------------------------------------------------------ No results for input(s): "HGBA1C" in the last 72  hours. ------------------------------------------------------------------------------------------------------------------ No results for input(s): "CHOL", "HDL", "LDLCALC", "TRIG", "CHOLHDL", "LDLDIRECT" in the last 72 hours. ------------------------------------------------------------------------------------------------------------------ No results for input(s): "TSH", "T4TOTAL", "T3FREE", "THYROIDAB" in the last 72 hours.  Invalid input(s): "FREET3" ------------------------------------------------------------------------------------------------------------------ No results for input(s): "VITAMINB12", "FOLATE", "FERRITIN", "TIBC", "IRON", "RETICCTPCT" in the last 72 hours.  Coagulation profile No results for input(s): "INR", "PROTIME" in the last 168 hours.  No results for input(s): "DDIMER" in the last 72 hours.  Cardiac Enzymes No results for input(s): "CKMB", "TROPONINI", "MYOGLOBIN" in the last 168 hours.  Invalid input(s): "CK" ------------------------------------------------------------------------------------------------------------------ Invalid input(s): "POCBNP" Time spent in minutes: 30 minutes     Elizabeth Sauer M.D on 08/27/2022 at 2:34 PM   Disclaimer: This note may have been dictated with voice recognition software. Similar sounding words can inadvertently be transcribed and this note may contain transcription errors which may not have been corrected upon publication of note.   Assessment and plan: See above 1. Coronary artery disease involving native coronary artery of native heart without angina pectoris See above - Amb Referral to Cardiac Rehabilitation - losartan (COZAAR) 25 MG tablet; Take 1 tablet (25 mg total) by mouth daily.  Dispense: 30 tablet; Refill: 0  2. Primary hypertension See above - losartan (COZAAR) 25 MG tablet; Take 1 tablet (25 mg total) by mouth daily.  Dispense: 30 tablet; Refill: 0  3. Familial hypercholesterolemia See  above    Elizabeth Sauer, MD

## 2022-08-28 ENCOUNTER — Encounter: Payer: Self-pay | Admitting: Family Medicine

## 2022-08-29 ENCOUNTER — Other Ambulatory Visit: Payer: Self-pay | Admitting: Family Medicine

## 2022-08-29 DIAGNOSIS — E7801 Familial hypercholesterolemia: Secondary | ICD-10-CM

## 2022-08-31 ENCOUNTER — Telehealth: Payer: Self-pay

## 2022-08-31 NOTE — Telephone Encounter (Signed)
Unable to refill per protocol, Rx expired. Discontinued 08/27/22.  Requested Prescriptions  Pending Prescriptions Disp Refills   rosuvastatin (CRESTOR) 5 MG tablet [Pharmacy Med Name: ROSUVASTATIN CALCIUM 5 MG TAB] 90 tablet 0    Sig: TAKE 1 TABLET (5 MG TOTAL) BY MOUTH DAILY.     Cardiovascular:  Antilipid - Statins 2 Failed - 08/29/2022  8:36 AM      Failed - Lipid Panel in normal range within the last 12 months    Cholesterol, Total  Date Value Ref Range Status  06/01/2022 272 (H) 100 - 199 mg/dL Final   LDL Chol Calc (NIH)  Date Value Ref Range Status  06/01/2022 175 (H) 0 - 99 mg/dL Final   HDL  Date Value Ref Range Status  06/01/2022 72 >39 mg/dL Final   Triglycerides  Date Value Ref Range Status  06/01/2022 139 0 - 149 mg/dL Final         Passed - Cr in normal range and within 360 days    Creatinine  Date Value Ref Range Status  07/01/2011 1.02 0.60 - 1.30 mg/dL Final   Creatinine, Ser  Date Value Ref Range Status  06/01/2022 0.71 0.57 - 1.00 mg/dL Final         Passed - Patient is not pregnant      Passed - Valid encounter within last 12 months    Recent Outpatient Visits           4 days ago Coronary artery disease involving native coronary artery of native heart without angina pectoris   [place] [facility]   3 months ago Transient vision disturbance   [facility]   10 months ago Acute maxillary sinusitis, recurrence not specified   [place] [facility], [facility]   1 year ago Annual physical exam   [facility], [facility]   1 year ago Recurrent vulvovaginal herpes simplex   [place] [facility], [facility]       Future Appointments             In 4 months  [name], MD [facility] at [facility]

## 2022-08-31 NOTE — Telephone Encounter (Signed)
[facility]- fax FMLA papers- [phone]

## 2022-09-01 ENCOUNTER — Telehealth: Payer: Self-pay

## 2022-09-01 ENCOUNTER — Ambulatory Visit: Payer: Self-pay | Admitting: *Deleted

## 2022-09-01 NOTE — Telephone Encounter (Signed)
  Chief Complaint: Having a headache ever since her losartan dose was changed from 1/2 pill to a whole pill. Symptoms: Headache  Tylenol helps.   Every once in a while she has to catch her breath.   She has been more active lately.   Frequency: Since Thur. The headache started with losartan dose change.  Pertinent Negatives: Patient denies chest pain.   The headache is the main complaint.    The instructions with my medicine said to call if I had a headache or shortness of breath.   That's why I'm calling.   Mainly the headache.   Disposition: [] ED /[] Urgent Care (no appt availability in office) / [] Appointment(In office/virtual)/ []  [place] [facility]/ [] Home Care/ [] Refused Recommended Disposition /[] [place] [facility]/  Follow-up with PCP Additional Notes: Message sent to Dr. Elizabeth Sauer.   Pt. Was agreeable to someone calling her back.

## 2022-09-01 NOTE — Telephone Encounter (Signed)
Reason for Disposition  [1] New headache AND [2] age > 50    Headache started with Losartan dose change on Thur.  Answer Assessment - Initial Assessment Questions 1. LOCATION: "Where does it hurt?"      I have a headache ever since my losartan dose was changed on Thursday. 2. ONSET: "When did the headache start?" (Minutes, hours or days)      It started when they changed my losartan from 1/2 pill to a whole pill.   Thursday. 3. PATTERN: "Does the pain come and go, or has it been constant since it started?"     Headache has been constant.   Tylenol helps the headache.   I saw Dr. Yetta Barre on 30th.     4. SEVERITY: "How bad is the pain?" and "What does it keep you from doing?"  (e.g., Scale 1-10; mild, moderate, or severe)   - MILD (1-3): doesn't interfere with normal activities    - MODERATE (4-7): interferes with normal activities or awakens from sleep    - SEVERE (8-10): excruciating pain, unable to do any normal activities        6/10 headache 5. RECURRENT SYMPTOM: "Have you ever had headaches before?" If Yes, ask: "When was the last time?" and "What happened that time?"      No  6. CAUSE: "What do you think is causing the headache?"     The change in my losartan dose. 7. MIGRAINE: "Have you been diagnosed with migraine headaches?" If Yes, ask: "Is this headache similar?"      No 8. HEAD INJURY: "Has there been any recent injury to the head?"      No 9. OTHER SYMPTOMS: "Do you have any other symptoms?" (fever, stiff neck, eye pain, sore throat, cold symptoms)     I've been more active lately so every once in a while I have to catch my breath.   I start cardiac rehab soon.  10. PREGNANCY: "Is there any chance you are pregnant?" "When was your last menstrual period?"       N/A  Protocols used: Headache-A-AH

## 2022-09-01 NOTE — Telephone Encounter (Signed)
Called pt and let her know the papers were faxed and I received confirmation. Fax- [phone] Atten: Darl Pikes

## 2022-09-02 ENCOUNTER — Encounter: Payer: Medicare HMO | Attending: Family Medicine

## 2022-09-02 ENCOUNTER — Other Ambulatory Visit: Payer: Self-pay

## 2022-09-02 DIAGNOSIS — I213 ST elevation (STEMI) myocardial infarction of unspecified site: Secondary | ICD-10-CM

## 2022-09-02 DIAGNOSIS — Z48812 Encounter for surgical aftercare following surgery on the circulatory system: Secondary | ICD-10-CM | POA: Insufficient documentation

## 2022-09-02 DIAGNOSIS — I252 Old myocardial infarction: Secondary | ICD-10-CM | POA: Insufficient documentation

## 2022-09-02 DIAGNOSIS — Z955 Presence of coronary angioplasty implant and graft: Secondary | ICD-10-CM | POA: Insufficient documentation

## 2022-09-02 DIAGNOSIS — I2510 Atherosclerotic heart disease of native coronary artery without angina pectoris: Secondary | ICD-10-CM | POA: Insufficient documentation

## 2022-09-02 NOTE — Progress Notes (Signed)
Virtual Visit completed. Patient informed on EP and RD appointment and 6 Minute walk test. Patient also informed of patient health questionnaires on My Chart. Patient Verbalizes understanding. Visit diagnosis can be found in [facility] 08/16/2022.

## 2022-09-04 ENCOUNTER — Ambulatory Visit: Payer: Self-pay | Admitting: *Deleted

## 2022-09-04 NOTE — Patient Outreach (Signed)
  Care Coordination   Follow Up Visit Note   09/04/2022 Name: Rabiah Goeser MRN: [id number] DOB: [birth year]  Esteen Delpriore is a 67 y.o. year old female who sees [name], MD for primary care. I spoke with  [name] by phone today.  What matters to the patients health and wellness today?  Obtain FMLA paperwork    Goals Addressed             This Visit's Progress    Recovery from STEMI       Care Coordination Interventions: Provided education on importance of blood pressure control in management of CAD Provided education on Importance of limiting foods high in cholesterol Counseled on importance of regular laboratory monitoring as prescribed Reviewed Importance of taking all medications as prescribed Reviewed Importance of attending all scheduled provider appointments Advised patient to discuss FMLA with provider Screening for signs and symptoms of depression related to chronic disease state Assessed social determinant of health barriers         SDOH assessments and interventions completed:  Yes  SDOH Interventions    Flowsheet Row Clinical Support from 08/12/2022 in [facility] at [facility] Clinical Support from 08/06/2021 in [facility] at [facility]  SDOH Interventions    Food Insecurity Interventions Intervention Not Indicated Intervention Not Indicated  Housing Interventions Intervention Not Indicated Intervention Not Indicated  Transportation Interventions Intervention Not Indicated Intervention Not Indicated  Utilities Interventions Intervention Not Indicated --  Alcohol Usage Interventions Intervention Not Indicated (Score <7) --  Depression Interventions/Treatment  -- PHQ2-9 Score <4 Follow-up Not Indicated  Financial Strain Interventions Intervention Not Indicated Intervention Not Indicated  Physical Activity Interventions Intervention Not Indicated Intervention Not  Indicated  Stress Interventions Intervention Not Indicated Intervention Not Indicated  Social Connections Interventions Intervention Not Indicated Intervention Not Indicated          Care Coordination Interventions:  Yes, provided   Interventions Today    Flowsheet Row Most Recent Value  Chronic Disease   Chronic disease during today's visit Other  [CAD/STEMI]  General Interventions   General Interventions Discussed/Reviewed General Interventions Reviewed, Doctor Visits, Durable Medical Equipment (DME)  Doctor Visits Discussed/Reviewed Doctor Visits Reviewed, PCP, Specialist  [cardiac rehab 6/10, cardiology 7/3.]  Durable Medical Equipment (DME) BP Cuff  [State she does not check BP because she was told her numbers were too different from office.  Advised to take monitor to next appointment for comparison, she is aware of need for daily monitoring]  PCP/Specialist Visits Compliance with follow-up visit  [PCP visit completed on 5/30]  Education Interventions   Education Provided Provided Education  Provided Verbal Education On Nutrition, Medication, When to see the doctor, Other  [Losartan decreased back to 1/2 pill, headaches have decreased.  Pt in need of FMLA paperwork, due by 6/14, cardiologist only in on Wed.  She will take forms to office Monday after rehab, hoping to have them completed on 6/12 and submitted by deadline]  Nutrition Interventions   Nutrition Discussed/Reviewed Nutrition Reviewed, Adding fruits and vegetables, Decreasing fats, Decreasing salt  [Educated on heart healthy diet]       Follow up plan: Follow up call scheduled for 6/21    Encounter Outcome:  Pt. Visit Completed   Kemper Durie, RN, MSN, [facility] [place] [facility] Care Management Care Management Coordinator [phone]

## 2022-09-07 VITALS — Ht 60.5 in | Wt 135.0 lb

## 2022-09-07 DIAGNOSIS — I252 Old myocardial infarction: Secondary | ICD-10-CM | POA: Diagnosis not present

## 2022-09-07 DIAGNOSIS — Z955 Presence of coronary angioplasty implant and graft: Secondary | ICD-10-CM | POA: Diagnosis not present

## 2022-09-07 DIAGNOSIS — I2510 Atherosclerotic heart disease of native coronary artery without angina pectoris: Secondary | ICD-10-CM | POA: Diagnosis not present

## 2022-09-07 DIAGNOSIS — Z48812 Encounter for surgical aftercare following surgery on the circulatory system: Secondary | ICD-10-CM | POA: Diagnosis not present

## 2022-09-07 DIAGNOSIS — I213 ST elevation (STEMI) myocardial infarction of unspecified site: Secondary | ICD-10-CM

## 2022-09-07 NOTE — Progress Notes (Signed)
Cardiac Individual Treatment Plan  Patient Details  Name: Kirsten Moore MRN: [id number] Date of Birth: February 19, 1956 Referring Provider:   Flowsheet Row Cardiac Rehab from 09/07/2022 in [facility] Cardiac and Pulmonary Rehab  Referring Provider Dr. Elizabeth Sauer, MD       Initial Encounter Date:  Flowsheet Row Cardiac Rehab from 09/07/2022 in [facility] Cardiac and Pulmonary Rehab  Date 09/07/22       Visit Diagnosis: ST elevation myocardial infarction (STEMI), unspecified artery (HCC)  Patient's Home Medications on Admission:  Current Outpatient Medications:    aspirin 81 MG chewable tablet, Chew by mouth., Disp: , Rfl:    aspirin EC 81 MG tablet, Take 81 mg by mouth daily. Swallow whole. (Patient not taking: Reported on 09/02/2022), Disp: , Rfl:    losartan (COZAAR) 25 MG tablet, Take 1 tablet (25 mg total) by mouth daily., Disp: 30 tablet, Rfl: 0   metoprolol succinate (TOPROL-XL) 25 MG 24 hr tablet, Take 1 tablet by mouth daily., Disp: , Rfl:    rosuvastatin (CRESTOR) 20 MG tablet, Take 1 tablet by mouth daily., Disp: , Rfl:    ticagrelor (BRILINTA) 90 MG TABS tablet, Take 90 mg by mouth 2 (two) times daily., Disp: , Rfl:    valACYclovir (VALTREX) 500 MG tablet, Take 1 tablet (500 mg total) by mouth 2 (two) times daily., Disp: 30 tablet, Rfl: 6   valACYclovir (VALTREX) 500 MG tablet, Take by mouth. (Patient not taking: Reported on 09/02/2022), Disp: , Rfl:   Past Medical History: Past Medical History:  Diagnosis Date   Hypertension     Tobacco Use: Social History   Tobacco Use  Smoking Status Former   Packs/day: 1.00   Years: 25.00   Additional pack years: 0.00   Total pack years: 25.00   Types: Cigarettes   Quit date: 09/01/2002   Years since quitting: 20.0  Smokeless Tobacco Never    Labs: Review Flowsheet  More data exists      Latest Ref Rng & Units 04/04/2018 04/19/2019 06/12/2020 08/13/2021 06/01/2022  Labs for ITP Cardiac and Pulmonary Rehab  Cholestrol 100 - 199 mg/dL 213   086  578  469  629   LDL (calc) 0 - 99 mg/dL 528  413  244  010  272   HDL-C >39 mg/dL 78  82  73  77  72   Trlycerides 0 - 149 mg/dL 536  644  034  742  595      Exercise Target Goals: Exercise Program Goal: Individual exercise prescription set using results from initial 6 min walk test and THRR while considering  patient's activity barriers and safety.   Exercise Prescription Goal: Initial exercise prescription builds to 30-45 minutes a day of aerobic activity, 2-3 days per week.  Home exercise guidelines will be given to patient during program as part of exercise prescription that the participant will acknowledge.   Education: Aerobic Exercise: - Group verbal and visual presentation on the components of exercise prescription. Introduces F.I.T.T principle from ACSM for exercise prescriptions.  Reviews F.I.T.T. principles of aerobic exercise including progression. Written material given at graduation.   Education: Resistance Exercise: - Group verbal and visual presentation on the components of exercise prescription. Introduces F.I.T.T principle from ACSM for exercise prescriptions  Reviews F.I.T.T. principles of resistance exercise including progression. Written material given at graduation.    Education: Exercise & Equipment Safety: - Individual verbal instruction and demonstration of equipment use and safety with use of the equipment. Flowsheet Row Cardiac Rehab  from 09/07/2022 in [facility] Cardiac and Pulmonary Rehab  Date 09/02/22  Educator [facility]  Instruction Review Code 1- Verbalizes Understanding       Education: Exercise Physiology & General Exercise Guidelines: - Group verbal and written instruction with models to review the exercise physiology of the cardiovascular system and associated critical values. Provides general exercise guidelines with specific guidelines to those with heart or lung disease.    Education: Flexibility, Balance, Mind/Body Relaxation: - Group verbal and  visual presentation with interactive activity on the components of exercise prescription. Introduces F.I.T.T principle from ACSM for exercise prescriptions. Reviews F.I.T.T. principles of flexibility and balance exercise training including progression. Also discusses the mind body connection.  Reviews various relaxation techniques to help reduce and manage stress (i.e. Deep breathing, progressive muscle relaxation, and visualization). Balance handout provided to take home. Written material given at graduation.   Activity Barriers & Risk Stratification:  Activity Barriers & Cardiac Risk Stratification - 09/07/22 1535       Activity Barriers & Cardiac Risk Stratification   Activity Barriers Other (comment)    Comments Occasional hip and knee pain    Cardiac Risk Stratification Moderate             6 Minute Walk:  6 Minute Walk     Row Name 09/07/22 1534         6 Minute Walk   Phase Initial     Distance 1205 feet     Walk Time 6 minutes     # of Rest Breaks 0     MPH 2.28     METS 2.73     RPE 11     Perceived Dyspnea  0     VO2 Peak 9.57     Symptoms No     Resting HR 65 bpm     Resting BP 118/68     Resting Oxygen Saturation  98 %     Exercise Oxygen Saturation  during 6 min walk 94 %     Max Ex. HR 90 bpm     Max Ex. BP 138/80     2 Minute Post BP 132/76              Oxygen Initial Assessment:   Oxygen Re-Evaluation:   Oxygen Discharge (Final Oxygen Re-Evaluation):   Initial Exercise Prescription:  Initial Exercise Prescription - 09/07/22 1500       Date of Initial Exercise RX and Referring Provider   Date 09/07/22    Referring Provider Dr. Elizabeth Sauer, MD      Oxygen   Maintain Oxygen Saturation 88% or higher      Treadmill   MPH 2    Grade 0.5    Minutes 15    METs 2.67      Recumbant Bike   Level 2    RPM 50    Watts 14    Minutes 15    METs 2.73      NuStep   Level 2    SPM 80    Minutes 15    METs 2.73      Track   Laps  32    Minutes 15    METs 2.74      Prescription Details   Frequency (times per week) 2    Duration Progress to 30 minutes of continuous aerobic without signs/symptoms of physical distress      Intensity   THRR 40-80% of Max Heartrate 99-134    Ratings of  Perceived Exertion 11-13    Perceived Dyspnea 0-4      Progression   Progression Continue to progress workloads to maintain intensity without signs/symptoms of physical distress.      Resistance Training   Training Prescription Yes    Weight 2 lb    Reps 10-15             Perform Capillary Blood Glucose checks as needed.  Exercise Prescription Changes:   Exercise Prescription Changes     Row Name 09/07/22 1500             Response to Exercise   Blood Pressure (Admit) 118/68       Blood Pressure (Exercise) 138/80       Blood Pressure (Exit) 132/76       Heart Rate (Admit) 65 bpm       Heart Rate (Exercise) 90 bpm       Heart Rate (Exit) 60 bpm       Oxygen Saturation (Admit) 98 %       Oxygen Saturation (Exercise) 94 %       Rating of Perceived Exertion (Exercise) 11       Perceived Dyspnea (Exercise) 0       Symptoms none       Comments Results                Exercise Comments:   Exercise Goals and Review:   Exercise Goals     Row Name 09/07/22 1538             Exercise Goals   Increase Physical Activity Yes       Intervention Provide advice, education, support and counseling about physical activity/exercise needs.;Develop an individualized exercise prescription for aerobic and resistive training based on initial evaluation findings, risk stratification, comorbidities and participant's personal goals.       Expected Outcomes Long Term: Add in home exercise to make exercise part of routine and to increase amount of physical activity.;Long Term: Exercising regularly at least 3-5 days a week.;Short Term: Attend rehab on a regular basis to increase amount of physical activity.        Increase Strength and Stamina Yes       Intervention Provide advice, education, support and counseling about physical activity/exercise needs.;Develop an individualized exercise prescription for aerobic and resistive training based on initial evaluation findings, risk stratification, comorbidities and participant's personal goals.       Expected Outcomes Short Term: Increase workloads from initial exercise prescription for resistance, speed, and METs.;Short Term: Perform resistance training exercises routinely during rehab and add in resistance training at home;Long Term: Improve cardiorespiratory fitness, muscular endurance and strength as measured by increased METs and functional capacity ( )       Able to understand and use rate of perceived exertion (RPE) scale Yes       Intervention Provide education and explanation on how to use RPE scale       Expected Outcomes Short Term: Able to use RPE daily in rehab to express subjective intensity level;Long Term:  Able to use RPE to guide intensity level when exercising independently       Able to understand and use Dyspnea scale Yes       Intervention Provide education and explanation on how to use Dyspnea scale       Expected Outcomes Short Term: Able to use Dyspnea scale daily in rehab to express subjective sense of shortness of breath during exertion;Long Term: Able  to use Dyspnea scale to guide intensity level when exercising independently       Knowledge and understanding of Target Heart Rate Range (THRR) Yes       Intervention Provide education and explanation of THRR including how the numbers were predicted and where they are located for reference       Expected Outcomes Short Term: Able to state/look up THRR;Long Term: Able to use THRR to govern intensity when exercising independently;Short Term: Able to use daily as guideline for intensity in rehab       Able to check pulse independently Yes       Intervention Provide education and demonstration  on how to check pulse in carotid and radial arteries.;Review the importance of being able to check your own pulse for safety during independent exercise       Expected Outcomes Short Term: Able to explain why pulse checking is important during independent exercise;Long Term: Able to check pulse independently and accurately       Understanding of Exercise Prescription Yes       Intervention Provide education, explanation, and written materials on patient's individual exercise prescription       Expected Outcomes Short Term: Able to explain program exercise prescription;Long Term: Able to explain home exercise prescription to exercise independently                Exercise Goals Re-Evaluation :   Discharge Exercise Prescription (Final Exercise Prescription Changes):  Exercise Prescription Changes - 09/07/22 1500       Response to Exercise   Blood Pressure (Admit) 118/68    Blood Pressure (Exercise) 138/80    Blood Pressure (Exit) 132/76    Heart Rate (Admit) 65 bpm    Heart Rate (Exercise) 90 bpm    Heart Rate (Exit) 60 bpm    Oxygen Saturation (Admit) 98 %    Oxygen Saturation (Exercise) 94 %    Rating of Perceived Exertion (Exercise) 11    Perceived Dyspnea (Exercise) 0    Symptoms none    Comments Results             Nutrition:  Target Goals: Understanding of nutrition guidelines, daily intake of sodium 1500mg , cholesterol 200mg , calories 30% from fat and 7% or less from saturated fats, daily to have 5 or more servings of fruits and vegetables.  Education: All About Nutrition: -Group instruction provided by verbal, written material, interactive activities, discussions, models, and posters to present general guidelines for heart healthy nutrition including fat, fiber, MyPlate, the role of sodium in heart healthy nutrition, utilization of the nutrition label, and utilization of this knowledge for meal planning. Follow up email sent as well. Written material given at  graduation. Flowsheet Row Cardiac Rehab from 09/07/2022 in [facility] Cardiac and Pulmonary Rehab  Education need identified 09/07/22       Biometrics:  Pre Biometrics - 09/07/22 1539       Pre Biometrics   Height 5' 0.5" (1.537 m)    Weight 135 lb (61.2 kg)    Waist Circumference 32.5 inches    Hip Circumference 39 inches    Waist to Hip Ratio 0.83 %    BMI (Calculated) 25.92    Single Leg Stand 12.7 seconds              Nutrition Therapy Plan and Nutrition Goals:  Nutrition Therapy & Goals - 09/07/22 1521       Intervention Plan   Intervention Prescribe, educate and counsel regarding individualized  specific dietary modifications aiming towards targeted core components such as weight, hypertension, lipid management, diabetes, heart failure and other comorbidities.    Expected Outcomes Short Term Goal: Understand basic principles of dietary content, such as calories, fat, sodium, cholesterol and nutrients.;Short Term Goal: A plan has been developed with personal nutrition goals set during dietitian appointment.;Long Term Goal: Adherence to prescribed nutrition plan.             Nutrition Assessments:  MEDIFICTS Score Key: ?70 Need to make dietary changes  40-70 Heart Healthy Diet ? 40 Therapeutic Level Cholesterol Diet  Flowsheet Row Cardiac Rehab from 09/07/2022 in [facility] Cardiac and Pulmonary Rehab  Picture Your Plate Total Score on Admission 53      Picture Your Plate Scores: <16 Unhealthy dietary pattern with much room for improvement. 41-50 Dietary pattern unlikely to meet recommendations for good health and room for improvement. 51-60 More healthful dietary pattern, with some room for improvement.  >60 Healthy dietary pattern, although there may be some specific behaviors that could be improved.    Nutrition Goals Re-Evaluation:   Nutrition Goals Discharge (Final Nutrition Goals Re-Evaluation):   Psychosocial: Target Goals: Acknowledge presence or  absence of significant depression and/or stress, maximize coping skills, provide positive support system. Participant is able to verbalize types and ability to use techniques and skills needed for reducing stress and depression.   Education: Stress, Anxiety, and Depression - Group verbal and visual presentation to define topics covered.  Reviews how body is impacted by stress, anxiety, and depression.  Also discusses healthy ways to reduce stress and to treat/manage anxiety and depression.  Written material given at graduation.   Education: Sleep Hygiene -Provides group verbal and written instruction about how sleep can affect your health.  Define sleep hygiene, discuss sleep cycles and impact of sleep habits. Review good sleep hygiene tips.    Initial Review & Psychosocial Screening:  Initial Psych Review & Screening - 09/02/22 1428       Initial Review   Current issues with None Identified      Family Dynamics   Good Support System? Yes    Comments She can look to her daughter son and best friend. Her family is near byto support her. Her husband died two years ago.      Barriers   Psychosocial barriers to participate in program The patient should benefit from training in stress management and relaxation.;There are no identifiable barriers or psychosocial needs.      Screening Interventions   Interventions To provide support and resources with identified psychosocial needs;Provide feedback about the scores to participant;Encouraged to exercise    Expected Outcomes Short Term goal: Utilizing psychosocial counselor, staff and physician to assist with identification of specific Stressors or current issues interfering with healing process. Setting desired goal for each stressor or current issue identified.;Long Term Goal: Stressors or current issues are controlled or eliminated.;Short Term goal: Identification and review with participant of any Quality of Life or Depression concerns found by  scoring the questionnaire.;Long Term goal: The participant improves quality of Life and PHQ9 Scores as seen by post scores and/or verbalization of changes             Quality of Life Scores:   Quality of Life - 09/07/22 1522       Quality of Life   Select Quality of Life      Quality of Life Scores   Health/Function Pre 24.67 %    Socioeconomic Pre 27.5 %  Psych/Spiritual Pre 24.64 %    Family Pre 22.8 %    GLOBAL Pre 24.97 %            Scores of 19 and below usually indicate a poorer quality of life in these areas.  A difference of  2-3 points is a clinically meaningful difference.  A difference of 2-3 points in the total score of the Quality of Life Index has been associated with significant improvement in overall quality of life, self-image, physical symptoms, and general health in studies assessing change in quality of life.  PHQ-9: Review Flowsheet  More data exists      09/07/2022 08/27/2022 08/12/2022 06/01/2022 10/31/2021  Depression screen PHQ 2/9  Decreased Interest 1 0 0 0 0  Down, Depressed, Hopeless 0 0 0 0 0  PHQ - 2 Score 1 0 0 0 0  Altered sleeping 0 0 0 0 0  Tired, decreased energy 1 0 0 0 0  Change in appetite 1 0 0 0 0  Feeling bad or failure about yourself  1 0 0 0 0  Trouble concentrating 0 0 0 0 0  Moving slowly or fidgety/restless 0 0 0 0 0  Suicidal thoughts 0 0 0 0 0  PHQ-9 Score 4 0 0 0 0  Difficult doing work/chores Somewhat difficult Not difficult at all Not difficult at all Not difficult at all Not difficult at all   Interpretation of Total Score  Total Score Depression Severity:  1-4 = Minimal depression, 5-9 = Mild depression, 10-14 = Moderate depression, 15-19 = Moderately severe depression, 20-27 = Severe depression   Psychosocial Evaluation and Intervention:  Psychosocial Evaluation - 09/02/22 1430       Psychosocial Evaluation & Interventions   Interventions Encouraged to exercise with the program and follow exercise  prescription;Relaxation education;Stress management education    Comments She can look to her daughter son and best friend. Her family is near byto support her. Her husband died two years ago. 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    Expected Outcomes Short: Start HeartTrack to help with mood. Long: Maintain a healthy mental state    Continue Psychosocial Services  Follow up required by staff             Psychosocial Re-Evaluation:   Psychosocial Discharge (Final Psychosocial Re-Evaluation):   Vocational Rehabilitation: Provide vocational rehab assistance to qualifying candidates.   Vocational Rehab Evaluation & Intervention:   Education: Education Goals: Education classes will be provided on a variety of topics geared toward better understanding of heart health and risk factor modification. Participant will state understanding/return demonstration of topics presented as noted by education test scores.  Learning Barriers/Preferences:  Learning Barriers/Preferences - 09/02/22 1426       Learning Barriers/Preferences   Learning Barriers None    Learning Preferences None             General Cardiac Education Topics:  AED/CPR: - Group verbal and written instruction with the use of models to demonstrate the basic use of the AED with the basic ABC's of resuscitation.   Anatomy and Cardiac Procedures: - Group verbal and visual presentation and models provide information about basic cardiac anatomy and function. Reviews the testing methods done to diagnose heart disease and the outcomes of the test results. Describes the treatment choices: Medical Management, Angioplasty, or Coronary Bypass Surgery for treating various heart conditions including Myocardial Infarction, Angina, Valve Disease, and Cardiac Arrhythmias.  Written  material given at graduation.   Medication Safety: - Group verbal and visual instruction to review commonly prescribed  medications for heart and lung disease. Reviews the medication, class of the drug, and side effects. Includes the steps to properly store meds and maintain the prescription regimen.  Written material given at graduation.   Intimacy: - Group verbal instruction through game format to discuss how heart and lung disease can affect sexual intimacy. Written material given at graduation..   Know Your Numbers and Heart Failure: - Group verbal and visual instruction to discuss disease risk factors for cardiac and pulmonary disease and treatment options.  Reviews associated critical values for Overweight/Obesity, Hypertension, Cholesterol, and Diabetes.  Discusses basics of heart failure: signs/symptoms and treatments.  Introduces Heart Failure Zone chart for action plan for heart failure.  Written material given at graduation.   Infection Prevention: - Provides verbal and written material to individual with discussion of infection control including proper hand washing and proper equipment cleaning during exercise session. Flowsheet Row Cardiac Rehab from 09/07/2022 in [facility] Cardiac and Pulmonary Rehab  Date 09/02/22  Educator [facility]  Instruction Review Code 1- Verbalizes Understanding       Falls Prevention: - Provides verbal and written material to individual with discussion of falls prevention and safety. Flowsheet Row Cardiac Rehab from 09/07/2022 in [facility] Cardiac and Pulmonary Rehab  Date 09/02/22  Educator [facility]  Instruction Review Code 1- Verbalizes Understanding       Other: -Provides group and verbal instruction on various topics (see comments)   Knowledge Questionnaire Score:  Knowledge Questionnaire Score - 09/07/22 1520       Knowledge Questionnaire Score   Pre Score 25/26             Core Components/Risk Factors/Patient Goals at Admission:  Personal Goals and Risk Factors at Admission - 09/02/22 1426       Core Components/Risk Factors/Patient Goals on Admission    Weight  Management Yes;Weight Loss    Intervention Weight Management: Develop a combined nutrition and exercise program designed to reach desired caloric intake, while maintaining appropriate intake of nutrient and fiber, sodium and fats, and appropriate energy expenditure required for the weight goal.;Weight Management: Provide education and appropriate resources to help participant work on and attain dietary goals.;Weight Management/Obesity: Establish reasonable short term and long term weight goals.    Expected Outcomes Short Term: Continue to assess and modify interventions until short term weight is achieved;Long Term: Adherence to nutrition and physical activity/exercise program aimed toward attainment of established weight goal;Weight Loss: Understanding of general recommendations for a balanced deficit meal plan, which promotes 1-2 lb weight loss per week and includes a negative energy balance of [phone] kcal/d;Understanding recommendations for meals to include 15-35% energy as protein, 25-35% energy from fat, 35-60% energy from carbohydrates, less than 200mg  of dietary cholesterol, 20-35 gm of total fiber daily;Understanding of distribution of calorie intake throughout the day with the consumption of 4-5 meals/snacks    Hypertension Yes    Intervention Provide education on lifestyle modifcations including regular physical activity/exercise, weight management, moderate sodium restriction and increased consumption of fresh fruit, vegetables, and low fat dairy, alcohol moderation, and smoking cessation.;Monitor prescription use compliance.    Expected Outcomes Short Term: Continued assessment and intervention until BP is < 140/78mm HG in hypertensive participants. < 130/37mm HG in hypertensive participants with diabetes, heart failure or chronic kidney disease.;Long Term: Maintenance of blood pressure at goal levels.    Lipids Yes    Intervention Provide  education and support for participant on nutrition &  aerobic/resistive exercise along with prescribed medications to achieve LDL 70mg , HDL >40mg .    Expected Outcomes Long Term: Cholesterol controlled with medications as prescribed, with individualized exercise RX and with personalized nutrition plan. Value goals: LDL < 70mg , HDL > 40 mg.;Short Term: Participant states understanding of desired cholesterol values and is compliant with medications prescribed. Participant is following exercise prescription and nutrition guidelines.             Education:Diabetes - Individual verbal and written instruction to review signs/symptoms of diabetes, desired ranges of glucose level fasting, after meals and with exercise. Acknowledge that pre and post exercise glucose checks will be done for 3 sessions at entry of program.   Core Components/Risk Factors/Patient Goals Review:    Core Components/Risk Factors/Patient Goals at Discharge (Final Review):    ITP Comments:  ITP Comments     Row Name 09/02/22 1425 09/07/22 1517         ITP Comments Virtual Visit completed. Patient informed on EP and RD appointment and 6 Minute walk test. Patient also informed of patient health questionnaires on My Chart. Patient Verbalizes understanding. Visit diagnosis can be found in [facility] 08/16/2022. Completed and gym orientation. Initial ITP created and sent for review to Dr. Bethann Punches, Medical Director.               Comments: Initial ITP

## 2022-09-07 NOTE — Patient Instructions (Signed)
Patient Instructions  Patient Details  Name: Kirsten Moore MRN: [id number] Date of Birth: 20-Aug-1955 Referring Provider:  [name], MD  Below are your personal goals for exercise, nutrition, and risk factors. Our goal is to help you stay on track towards obtaining and maintaining these goals. We will be discussing your progress on these goals with you throughout the program.  Initial Exercise Prescription:  Initial Exercise Prescription - 09/07/22 1500       Date of Initial Exercise RX and Referring Provider   Date 09/07/22    Referring Provider Dr. Elizabeth Sauer, MD      Oxygen   Maintain Oxygen Saturation 88% or higher      Treadmill   MPH 2    Grade 0.5    Minutes 15    METs 2.67      Recumbant Bike   Level 2    RPM 50    Watts 14    Minutes 15    METs 2.73      NuStep   Level 2    SPM 80    Minutes 15    METs 2.73      Track   Laps 32    Minutes 15    METs 2.74      Prescription Details   Frequency (times per week) 2    Duration Progress to 30 minutes of continuous aerobic without signs/symptoms of physical distress      Intensity   THRR 40-80% of Max Heartrate 99-134    Ratings of Perceived Exertion 11-13    Perceived Dyspnea 0-4      Progression   Progression Continue to progress workloads to maintain intensity without signs/symptoms of physical distress.      Resistance Training   Training Prescription Yes    Weight 2 lb    Reps 10-15             Exercise Goals: Frequency: Be able to perform aerobic exercise two to three times per week in program working toward 2-5 days per week of home exercise.  Intensity: Work with a perceived exertion of 11 (fairly light) - 15 (hard) while following your exercise prescription.  We will make changes to your prescription with you as you progress through the program.   Duration: Be able to do 30 to 45 minutes of continuous aerobic exercise in addition to a 5 minute warm-up and a 5 minute  cool-down routine.   Nutrition Goals: Your personal nutrition goals will be established when you do your nutrition analysis with the dietician.  The following are general nutrition guidelines to follow: Cholesterol < 200mg /day Sodium < 1500mg /day Fiber: Women over 50 yrs - 21 grams per day  Personal Goals:  Personal Goals and Risk Factors at Admission - 09/02/22 1426       Core Components/Risk Factors/Patient Goals on Admission    Weight Management Yes;Weight Loss    Intervention Weight Management: Develop a combined nutrition and exercise program designed to reach desired caloric intake, while maintaining appropriate intake of nutrient and fiber, sodium and fats, and appropriate energy expenditure required for the weight goal.;Weight Management: Provide education and appropriate resources to help participant work on and attain dietary goals.;Weight Management/Obesity: Establish reasonable short term and long term weight goals.    Expected Outcomes Short Term: Continue to assess and modify interventions until short term weight is achieved;Long Term: Adherence to nutrition and physical activity/exercise program aimed toward attainment of established weight goal;Weight Loss: Understanding of  general recommendations for a balanced deficit meal plan, which promotes 1-2 lb weight loss per week and includes a negative energy balance of [phone] kcal/d;Understanding recommendations for meals to include 15-35% energy as protein, 25-35% energy from fat, 35-60% energy from carbohydrates, less than 200mg  of dietary cholesterol, 20-35 gm of total fiber daily;Understanding of distribution of calorie intake throughout the day with the consumption of 4-5 meals/snacks    Hypertension Yes    Intervention Provide education on lifestyle modifcations including regular physical activity/exercise, weight management, moderate sodium restriction and increased consumption of fresh fruit, vegetables, and low fat dairy,  alcohol moderation, and smoking cessation.;Monitor prescription use compliance.    Expected Outcomes Short Term: Continued assessment and intervention until BP is < 140/69mm HG in hypertensive participants. < 130/37mm HG in hypertensive participants with diabetes, heart failure or chronic kidney disease.;Long Term: Maintenance of blood pressure at goal levels.    Lipids Yes    Intervention Provide education and support for participant on nutrition & aerobic/resistive exercise along with prescribed medications to achieve LDL 70mg , HDL >40mg .    Expected Outcomes Long Term: Cholesterol controlled with medications as prescribed, with individualized exercise RX and with personalized nutrition plan. Value goals: LDL < 70mg , HDL > 40 mg.;Short Term: Participant states understanding of desired cholesterol values and is compliant with medications prescribed. Participant is following exercise prescription and nutrition guidelines.             Tobacco Use Initial Evaluation: Social History   Tobacco Use  Smoking Status Former   Packs/day: 1.00   Years: 25.00   Additional pack years: 0.00   Total pack years: 25.00   Types: Cigarettes   Quit date: 09/01/2002   Years since quitting: 20.0  Smokeless Tobacco Never    Exercise Goals and Review:  Exercise Goals     Row Name 09/07/22 1538             Exercise Goals   Increase Physical Activity Yes       Intervention Provide advice, education, support and counseling about physical activity/exercise needs.;Develop an individualized exercise prescription for aerobic and resistive training based on initial evaluation findings, risk stratification, comorbidities and participant's personal goals.       Expected Outcomes Long Term: Add in home exercise to make exercise part of routine and to increase amount of physical activity.;Long Term: Exercising regularly at least 3-5 days a week.;Short Term: Attend rehab on a regular basis to increase amount of  physical activity.       Increase Strength and Stamina Yes       Intervention Provide advice, education, support and counseling about physical activity/exercise needs.;Develop an individualized exercise prescription for aerobic and resistive training based on initial evaluation findings, risk stratification, comorbidities and participant's personal goals.       Expected Outcomes Short Term: Increase workloads from initial exercise prescription for resistance, speed, and METs.;Short Term: Perform resistance training exercises routinely during rehab and add in resistance training at home;Long Term: Improve cardiorespiratory fitness, muscular endurance and strength as measured by increased METs and functional capacity ( )       Able to understand and use rate of perceived exertion (RPE) scale Yes       Intervention Provide education and explanation on how to use RPE scale       Expected Outcomes Short Term: Able to use RPE daily in rehab to express subjective intensity level;Long Term:  Able to use RPE to guide intensity level when exercising independently  Able to understand and use Dyspnea scale Yes       Intervention Provide education and explanation on how to use Dyspnea scale       Expected Outcomes Short Term: Able to use Dyspnea scale daily in rehab to express subjective sense of shortness of breath during exertion;Long Term: Able to use Dyspnea scale to guide intensity level when exercising independently       Knowledge and understanding of Target Heart Rate Range (THRR) Yes       Intervention Provide education and explanation of THRR including how the numbers were predicted and where they are located for reference       Expected Outcomes Short Term: Able to state/look up THRR;Long Term: Able to use THRR to govern intensity when exercising independently;Short Term: Able to use daily as guideline for intensity in rehab       Able to check pulse independently Yes       Intervention Provide  education and demonstration on how to check pulse in carotid and radial arteries.;Review the importance of being able to check your own pulse for safety during independent exercise       Expected Outcomes Short Term: Able to explain why pulse checking is important during independent exercise;Long Term: Able to check pulse independently and accurately       Understanding of Exercise Prescription Yes       Intervention Provide education, explanation, and written materials on patient's individual exercise prescription       Expected Outcomes Short Term: Able to explain program exercise prescription;Long Term: Able to explain home exercise prescription to exercise independently

## 2022-09-09 ENCOUNTER — Telehealth: Payer: Self-pay | Admitting: Family Medicine

## 2022-09-09 ENCOUNTER — Encounter: Payer: Self-pay | Admitting: *Deleted

## 2022-09-09 DIAGNOSIS — I213 ST elevation (STEMI) myocardial infarction of unspecified site: Secondary | ICD-10-CM

## 2022-09-09 NOTE — Progress Notes (Signed)
Cardiac Individual Treatment Plan  Patient Details  Name: Kirsten Moore MRN: [id number] Date of Birth: Jul 07, 1955 Referring Provider:   Flowsheet Row Cardiac Rehab from 09/07/2022 in [facility] Cardiac and Pulmonary Rehab  Referring Provider Dr. Elizabeth Sauer, MD       Initial Encounter Date:  Flowsheet Row Cardiac Rehab from 09/07/2022 in [facility] Cardiac and Pulmonary Rehab  Date 09/07/22       Visit Diagnosis: ST elevation myocardial infarction (STEMI), unspecified artery (HCC)  Patient's Home Medications on Admission:  Current Outpatient Medications:    aspirin 81 MG chewable tablet, Chew by mouth., Disp: , Rfl:    aspirin EC 81 MG tablet, Take 81 mg by mouth daily. Swallow whole. (Patient not taking: Reported on 09/02/2022), Disp: , Rfl:    losartan (COZAAR) 25 MG tablet, Take 1 tablet (25 mg total) by mouth daily., Disp: 30 tablet, Rfl: 0   metoprolol succinate (TOPROL-XL) 25 MG 24 hr tablet, Take 1 tablet by mouth daily., Disp: , Rfl:    rosuvastatin (CRESTOR) 20 MG tablet, Take 1 tablet by mouth daily., Disp: , Rfl:    ticagrelor (BRILINTA) 90 MG TABS tablet, Take 90 mg by mouth 2 (two) times daily., Disp: , Rfl:    valACYclovir (VALTREX) 500 MG tablet, Take 1 tablet (500 mg total) by mouth 2 (two) times daily., Disp: 30 tablet, Rfl: 6   valACYclovir (VALTREX) 500 MG tablet, Take by mouth. (Patient not taking: Reported on 09/02/2022), Disp: , Rfl:   Past Medical History: Past Medical History:  Diagnosis Date   Hypertension     Tobacco Use: Social History   Tobacco Use  Smoking Status Former   Packs/day: 1.00   Years: 25.00   Additional pack years: 0.00   Total pack years: 25.00   Types: Cigarettes   Quit date: 09/01/2002   Years since quitting: 20.0  Smokeless Tobacco Never    Labs: Review Flowsheet  More data exists      Latest Ref Rng & Units 04/04/2018 04/19/2019 06/12/2020 08/13/2021 06/01/2022  Labs for ITP Cardiac and Pulmonary Rehab  Cholestrol 100 - 199 mg/dL 191   478  295  621  308   LDL (calc) 0 - 99 mg/dL 657  846  962  952  841   HDL-C >39 mg/dL 78  82  73  77  72   Trlycerides 0 - 149 mg/dL 324  401  027  253  664      Exercise Target Goals: Exercise Program Goal: Individual exercise prescription set using results from initial 6 min walk test and THRR while considering  patient's activity barriers and safety.   Exercise Prescription Goal: Initial exercise prescription builds to 30-45 minutes a day of aerobic activity, 2-3 days per week.  Home exercise guidelines will be given to patient during program as part of exercise prescription that the participant will acknowledge.   Education: Aerobic Exercise: - Group verbal and visual presentation on the components of exercise prescription. Introduces F.I.T.T principle from ACSM for exercise prescriptions.  Reviews F.I.T.T. principles of aerobic exercise including progression. Written material given at graduation.   Education: Resistance Exercise: - Group verbal and visual presentation on the components of exercise prescription. Introduces F.I.T.T principle from ACSM for exercise prescriptions  Reviews F.I.T.T. principles of resistance exercise including progression. Written material given at graduation.    Education: Exercise & Equipment Safety: - Individual verbal instruction and demonstration of equipment use and safety with use of the equipment. Flowsheet Row Cardiac Rehab  from 09/07/2022 in [facility] Cardiac and Pulmonary Rehab  Date 09/02/22  Educator [facility]  Instruction Review Code 1- Verbalizes Understanding       Education: Exercise Physiology & General Exercise Guidelines: - Group verbal and written instruction with models to review the exercise physiology of the cardiovascular system and associated critical values. Provides general exercise guidelines with specific guidelines to those with heart or lung disease.    Education: Flexibility, Balance, Mind/Body Relaxation: - Group verbal and  visual presentation with interactive activity on the components of exercise prescription. Introduces F.I.T.T principle from ACSM for exercise prescriptions. Reviews F.I.T.T. principles of flexibility and balance exercise training including progression. Also discusses the mind body connection.  Reviews various relaxation techniques to help reduce and manage stress (i.e. Deep breathing, progressive muscle relaxation, and visualization). Balance handout provided to take home. Written material given at graduation.   Activity Barriers & Risk Stratification:  Activity Barriers & Cardiac Risk Stratification - 09/07/22 1535       Activity Barriers & Cardiac Risk Stratification   Activity Barriers Other (comment)    Comments Occasional hip and knee pain    Cardiac Risk Stratification Moderate             6 Minute Walk:  6 Minute Walk     Row Name 09/07/22 1534         6 Minute Walk   Phase Initial     Distance 1205 feet     Walk Time 6 minutes     # of Rest Breaks 0     MPH 2.28     METS 2.73     RPE 11     Perceived Dyspnea  0     VO2 Peak 9.57     Symptoms No     Resting HR 65 bpm     Resting BP 118/68     Resting Oxygen Saturation  98 %     Exercise Oxygen Saturation  during 6 min walk 94 %     Max Ex. HR 90 bpm     Max Ex. BP 138/80     2 Minute Post BP 132/76              Oxygen Initial Assessment:   Oxygen Re-Evaluation:   Oxygen Discharge (Final Oxygen Re-Evaluation):   Initial Exercise Prescription:  Initial Exercise Prescription - 09/07/22 1500       Date of Initial Exercise RX and Referring Provider   Date 09/07/22    Referring Provider Dr. Elizabeth Sauer, MD      Oxygen   Maintain Oxygen Saturation 88% or higher      Treadmill   MPH 2    Grade 0.5    Minutes 15    METs 2.67      Recumbant Bike   Level 2    RPM 50    Watts 14    Minutes 15    METs 2.73      NuStep   Level 2    SPM 80    Minutes 15    METs 2.73      Track   Laps  32    Minutes 15    METs 2.74      Prescription Details   Frequency (times per week) 2    Duration Progress to 30 minutes of continuous aerobic without signs/symptoms of physical distress      Intensity   THRR 40-80% of Max Heartrate 99-134    Ratings of  Perceived Exertion 11-13    Perceived Dyspnea 0-4      Progression   Progression Continue to progress workloads to maintain intensity without signs/symptoms of physical distress.      Resistance Training   Training Prescription Yes    Weight 2 lb    Reps 10-15             Perform Capillary Blood Glucose checks as needed.  Exercise Prescription Changes:   Exercise Prescription Changes     Row Name 09/07/22 1500             Response to Exercise   Blood Pressure (Admit) 118/68       Blood Pressure (Exercise) 138/80       Blood Pressure (Exit) 132/76       Heart Rate (Admit) 65 bpm       Heart Rate (Exercise) 90 bpm       Heart Rate (Exit) 60 bpm       Oxygen Saturation (Admit) 98 %       Oxygen Saturation (Exercise) 94 %       Rating of Perceived Exertion (Exercise) 11       Perceived Dyspnea (Exercise) 0       Symptoms none       Comments Results                Exercise Comments:   Exercise Goals and Review:   Exercise Goals     Row Name 09/07/22 1538             Exercise Goals   Increase Physical Activity Yes       Intervention Provide advice, education, support and counseling about physical activity/exercise needs.;Develop an individualized exercise prescription for aerobic and resistive training based on initial evaluation findings, risk stratification, comorbidities and participant's personal goals.       Expected Outcomes Long Term: Add in home exercise to make exercise part of routine and to increase amount of physical activity.;Long Term: Exercising regularly at least 3-5 days a week.;Short Term: Attend rehab on a regular basis to increase amount of physical activity.        Increase Strength and Stamina Yes       Intervention Provide advice, education, support and counseling about physical activity/exercise needs.;Develop an individualized exercise prescription for aerobic and resistive training based on initial evaluation findings, risk stratification, comorbidities and participant's personal goals.       Expected Outcomes Short Term: Increase workloads from initial exercise prescription for resistance, speed, and METs.;Short Term: Perform resistance training exercises routinely during rehab and add in resistance training at home;Long Term: Improve cardiorespiratory fitness, muscular endurance and strength as measured by increased METs and functional capacity ( )       Able to understand and use rate of perceived exertion (RPE) scale Yes       Intervention Provide education and explanation on how to use RPE scale       Expected Outcomes Short Term: Able to use RPE daily in rehab to express subjective intensity level;Long Term:  Able to use RPE to guide intensity level when exercising independently       Able to understand and use Dyspnea scale Yes       Intervention Provide education and explanation on how to use Dyspnea scale       Expected Outcomes Short Term: Able to use Dyspnea scale daily in rehab to express subjective sense of shortness of breath during exertion;Long Term: Able  to use Dyspnea scale to guide intensity level when exercising independently       Knowledge and understanding of Target Heart Rate Range (THRR) Yes       Intervention Provide education and explanation of THRR including how the numbers were predicted and where they are located for reference       Expected Outcomes Short Term: Able to state/look up THRR;Long Term: Able to use THRR to govern intensity when exercising independently;Short Term: Able to use daily as guideline for intensity in rehab       Able to check pulse independently Yes       Intervention Provide education and demonstration  on how to check pulse in carotid and radial arteries.;Review the importance of being able to check your own pulse for safety during independent exercise       Expected Outcomes Short Term: Able to explain why pulse checking is important during independent exercise;Long Term: Able to check pulse independently and accurately       Understanding of Exercise Prescription Yes       Intervention Provide education, explanation, and written materials on patient's individual exercise prescription       Expected Outcomes Short Term: Able to explain program exercise prescription;Long Term: Able to explain home exercise prescription to exercise independently                Exercise Goals Re-Evaluation :   Discharge Exercise Prescription (Final Exercise Prescription Changes):  Exercise Prescription Changes - 09/07/22 1500       Response to Exercise   Blood Pressure (Admit) 118/68    Blood Pressure (Exercise) 138/80    Blood Pressure (Exit) 132/76    Heart Rate (Admit) 65 bpm    Heart Rate (Exercise) 90 bpm    Heart Rate (Exit) 60 bpm    Oxygen Saturation (Admit) 98 %    Oxygen Saturation (Exercise) 94 %    Rating of Perceived Exertion (Exercise) 11    Perceived Dyspnea (Exercise) 0    Symptoms none    Comments Results             Nutrition:  Target Goals: Understanding of nutrition guidelines, daily intake of sodium 1500mg , cholesterol 200mg , calories 30% from fat and 7% or less from saturated fats, daily to have 5 or more servings of fruits and vegetables.  Education: All About Nutrition: -Group instruction provided by verbal, written material, interactive activities, discussions, models, and posters to present general guidelines for heart healthy nutrition including fat, fiber, MyPlate, the role of sodium in heart healthy nutrition, utilization of the nutrition label, and utilization of this knowledge for meal planning. Follow up email sent as well. Written material given at  graduation. Flowsheet Row Cardiac Rehab from 09/07/2022 in [facility] Cardiac and Pulmonary Rehab  Education need identified 09/07/22       Biometrics:  Pre Biometrics - 09/07/22 1539       Pre Biometrics   Height 5' 0.5" (1.537 m)    Weight 135 lb (61.2 kg)    Waist Circumference 32.5 inches    Hip Circumference 39 inches    Waist to Hip Ratio 0.83 %    BMI (Calculated) 25.92    Single Leg Stand 12.7 seconds              Nutrition Therapy Plan and Nutrition Goals:  Nutrition Therapy & Goals - 09/07/22 1521       Intervention Plan   Intervention Prescribe, educate and counsel regarding individualized  specific dietary modifications aiming towards targeted core components such as weight, hypertension, lipid management, diabetes, heart failure and other comorbidities.    Expected Outcomes Short Term Goal: Understand basic principles of dietary content, such as calories, fat, sodium, cholesterol and nutrients.;Short Term Goal: A plan has been developed with personal nutrition goals set during dietitian appointment.;Long Term Goal: Adherence to prescribed nutrition plan.             Nutrition Assessments:  MEDIFICTS Score Key: ?70 Need to make dietary changes  40-70 Heart Healthy Diet ? 40 Therapeutic Level Cholesterol Diet  Flowsheet Row Cardiac Rehab from 09/07/2022 in [facility] Cardiac and Pulmonary Rehab  Picture Your Plate Total Score on Admission 53      Picture Your Plate Scores: <16 Unhealthy dietary pattern with much room for improvement. 41-50 Dietary pattern unlikely to meet recommendations for good health and room for improvement. 51-60 More healthful dietary pattern, with some room for improvement.  >60 Healthy dietary pattern, although there may be some specific behaviors that could be improved.    Nutrition Goals Re-Evaluation:   Nutrition Goals Discharge (Final Nutrition Goals Re-Evaluation):   Psychosocial: Target Goals: Acknowledge presence or  absence of significant depression and/or stress, maximize coping skills, provide positive support system. Participant is able to verbalize types and ability to use techniques and skills needed for reducing stress and depression.   Education: Stress, Anxiety, and Depression - Group verbal and visual presentation to define topics covered.  Reviews how body is impacted by stress, anxiety, and depression.  Also discusses healthy ways to reduce stress and to treat/manage anxiety and depression.  Written material given at graduation.   Education: Sleep Hygiene -Provides group verbal and written instruction about how sleep can affect your health.  Define sleep hygiene, discuss sleep cycles and impact of sleep habits. Review good sleep hygiene tips.    Initial Review & Psychosocial Screening:  Initial Psych Review & Screening - 09/02/22 1428       Initial Review   Current issues with None Identified      Family Dynamics   Good Support System? Yes    Comments She can look to her daughter son and best friend. Her family is near byto support her. Her husband died two years ago.      Barriers   Psychosocial barriers to participate in program The patient should benefit from training in stress management and relaxation.;There are no identifiable barriers or psychosocial needs.      Screening Interventions   Interventions To provide support and resources with identified psychosocial needs;Provide feedback about the scores to participant;Encouraged to exercise    Expected Outcomes Short Term goal: Utilizing psychosocial counselor, staff and physician to assist with identification of specific Stressors or current issues interfering with healing process. Setting desired goal for each stressor or current issue identified.;Long Term Goal: Stressors or current issues are controlled or eliminated.;Short Term goal: Identification and review with participant of any Quality of Life or Depression concerns found by  scoring the questionnaire.;Long Term goal: The participant improves quality of Life and PHQ9 Scores as seen by post scores and/or verbalization of changes             Quality of Life Scores:   Quality of Life - 09/07/22 1522       Quality of Life   Select Quality of Life      Quality of Life Scores   Health/Function Pre 24.67 %    Socioeconomic Pre 27.5 %  Psych/Spiritual Pre 24.64 %    Family Pre 22.8 %    GLOBAL Pre 24.97 %            Scores of 19 and below usually indicate a poorer quality of life in these areas.  A difference of  2-3 points is a clinically meaningful difference.  A difference of 2-3 points in the total score of the Quality of Life Index has been associated with significant improvement in overall quality of life, self-image, physical symptoms, and general health in studies assessing change in quality of life.  PHQ-9: Review Flowsheet  More data exists      09/07/2022 08/27/2022 08/12/2022 06/01/2022 10/31/2021  Depression screen PHQ 2/9  Decreased Interest 1 0 0 0 0  Down, Depressed, Hopeless 0 0 0 0 0  PHQ - 2 Score 1 0 0 0 0  Altered sleeping 0 0 0 0 0  Tired, decreased energy 1 0 0 0 0  Change in appetite 1 0 0 0 0  Feeling bad or failure about yourself  1 0 0 0 0  Trouble concentrating 0 0 0 0 0  Moving slowly or fidgety/restless 0 0 0 0 0  Suicidal thoughts 0 0 0 0 0  PHQ-9 Score 4 0 0 0 0  Difficult doing work/chores Somewhat difficult Not difficult at all Not difficult at all Not difficult at all Not difficult at all   Interpretation of Total Score  Total Score Depression Severity:  1-4 = Minimal depression, 5-9 = Mild depression, 10-14 = Moderate depression, 15-19 = Moderately severe depression, 20-27 = Severe depression   Psychosocial Evaluation and Intervention:  Psychosocial Evaluation - 09/02/22 1430       Psychosocial Evaluation & Interventions   Interventions Encouraged to exercise with the program and follow exercise  prescription;Relaxation education;Stress management education    Comments She can look to her daughter son and best friend. Her family is near byto support her. Her husband died two years ago. 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    Expected Outcomes Short: Start HeartTrack to help with mood. Long: Maintain a healthy mental state    Continue Psychosocial Services  Follow up required by staff             Psychosocial Re-Evaluation:   Psychosocial Discharge (Final Psychosocial Re-Evaluation):   Vocational Rehabilitation: Provide vocational rehab assistance to qualifying candidates.   Vocational Rehab Evaluation & Intervention:   Education: Education Goals: Education classes will be provided on a variety of topics geared toward better understanding of heart health and risk factor modification. Participant will state understanding/return demonstration of topics presented as noted by education test scores.  Learning Barriers/Preferences:  Learning Barriers/Preferences - 09/02/22 1426       Learning Barriers/Preferences   Learning Barriers None    Learning Preferences None             General Cardiac Education Topics:  AED/CPR: - Group verbal and written instruction with the use of models to demonstrate the basic use of the AED with the basic ABC's of resuscitation.   Anatomy and Cardiac Procedures: - Group verbal and visual presentation and models provide information about basic cardiac anatomy and function. Reviews the testing methods done to diagnose heart disease and the outcomes of the test results. Describes the treatment choices: Medical Management, Angioplasty, or Coronary Bypass Surgery for treating various heart conditions including Myocardial Infarction, Angina, Valve Disease, and Cardiac Arrhythmias.  Written  material given at graduation.   Medication Safety: - Group verbal and visual instruction to review commonly prescribed  medications for heart and lung disease. Reviews the medication, class of the drug, and side effects. Includes the steps to properly store meds and maintain the prescription regimen.  Written material given at graduation.   Intimacy: - Group verbal instruction through game format to discuss how heart and lung disease can affect sexual intimacy. Written material given at graduation..   Know Your Numbers and Heart Failure: - Group verbal and visual instruction to discuss disease risk factors for cardiac and pulmonary disease and treatment options.  Reviews associated critical values for Overweight/Obesity, Hypertension, Cholesterol, and Diabetes.  Discusses basics of heart failure: signs/symptoms and treatments.  Introduces Heart Failure Zone chart for action plan for heart failure.  Written material given at graduation.   Infection Prevention: - Provides verbal and written material to individual with discussion of infection control including proper hand washing and proper equipment cleaning during exercise session. Flowsheet Row Cardiac Rehab from 09/07/2022 in [facility] Cardiac and Pulmonary Rehab  Date 09/02/22  Educator [facility]  Instruction Review Code 1- Verbalizes Understanding       Falls Prevention: - Provides verbal and written material to individual with discussion of falls prevention and safety. Flowsheet Row Cardiac Rehab from 09/07/2022 in [facility] Cardiac and Pulmonary Rehab  Date 09/02/22  Educator [facility]  Instruction Review Code 1- Verbalizes Understanding       Other: -Provides group and verbal instruction on various topics (see comments)   Knowledge Questionnaire Score:  Knowledge Questionnaire Score - 09/07/22 1520       Knowledge Questionnaire Score   Pre Score 25/26             Core Components/Risk Factors/Patient Goals at Admission:  Personal Goals and Risk Factors at Admission - 09/02/22 1426       Core Components/Risk Factors/Patient Goals on Admission    Weight  Management Yes;Weight Loss    Intervention Weight Management: Develop a combined nutrition and exercise program designed to reach desired caloric intake, while maintaining appropriate intake of nutrient and fiber, sodium and fats, and appropriate energy expenditure required for the weight goal.;Weight Management: Provide education and appropriate resources to help participant work on and attain dietary goals.;Weight Management/Obesity: Establish reasonable short term and long term weight goals.    Expected Outcomes Short Term: Continue to assess and modify interventions until short term weight is achieved;Long Term: Adherence to nutrition and physical activity/exercise program aimed toward attainment of established weight goal;Weight Loss: Understanding of general recommendations for a balanced deficit meal plan, which promotes 1-2 lb weight loss per week and includes a negative energy balance of [phone] kcal/d;Understanding recommendations for meals to include 15-35% energy as protein, 25-35% energy from fat, 35-60% energy from carbohydrates, less than 200mg  of dietary cholesterol, 20-35 gm of total fiber daily;Understanding of distribution of calorie intake throughout the day with the consumption of 4-5 meals/snacks    Hypertension Yes    Intervention Provide education on lifestyle modifcations including regular physical activity/exercise, weight management, moderate sodium restriction and increased consumption of fresh fruit, vegetables, and low fat dairy, alcohol moderation, and smoking cessation.;Monitor prescription use compliance.    Expected Outcomes Short Term: Continued assessment and intervention until BP is < 140/78mm HG in hypertensive participants. < 130/37mm HG in hypertensive participants with diabetes, heart failure or chronic kidney disease.;Long Term: Maintenance of blood pressure at goal levels.    Lipids Yes    Intervention Provide  education and support for participant on nutrition &  aerobic/resistive exercise along with prescribed medications to achieve LDL 70mg , HDL >40mg .    Expected Outcomes Long Term: Cholesterol controlled with medications as prescribed, with individualized exercise RX and with personalized nutrition plan. Value goals: LDL < 70mg , HDL > 40 mg.;Short Term: Participant states understanding of desired cholesterol values and is compliant with medications prescribed. Participant is following exercise prescription and nutrition guidelines.             Education:Diabetes - Individual verbal and written instruction to review signs/symptoms of diabetes, desired ranges of glucose level fasting, after meals and with exercise. Acknowledge that pre and post exercise glucose checks will be done for 3 sessions at entry of program.   Core Components/Risk Factors/Patient Goals Review:    Core Components/Risk Factors/Patient Goals at Discharge (Final Review):    ITP Comments:  ITP Comments     Row Name 09/02/22 1425 09/07/22 1517 09/09/22 1125       ITP Comments Virtual Visit completed. Patient informed on EP and RD appointment and 6 Minute walk test. Patient also informed of patient health questionnaires on My Chart. Patient Verbalizes understanding. Visit diagnosis can be found in [facility] 08/16/2022. Completed and gym orientation. Initial ITP created and sent for review to Dr. Bethann Punches, Medical Director. 30 Day review completed. Medical Director ITP review done, changes made as directed, and signed approval by Medical Director.   new to program              Comments:

## 2022-09-09 NOTE — Telephone Encounter (Signed)
Copied from CRM [id number]. Topic: General - Other >> Sep 09, 2022  9:53 AM Franchot Heidelberg wrote: Reason for CRM: Pt wants to know how she "can get FMLA paperwork from the cardiac doctor because she has not had an appt with that provider yet, not until July 3rd."   Best contact: [phone]

## 2022-09-10 ENCOUNTER — Telehealth: Payer: Self-pay

## 2022-09-10 NOTE — Telephone Encounter (Signed)
Called [facility] and spoke to [facility] concerning FMLA paperwork for pt. They did receive the papers I faxed and she will have Darl Pikes call me back. [phone]

## 2022-09-13 ENCOUNTER — Other Ambulatory Visit: Payer: Self-pay | Admitting: Family Medicine

## 2022-09-13 DIAGNOSIS — E7801 Familial hypercholesterolemia: Secondary | ICD-10-CM

## 2022-09-14 NOTE — Telephone Encounter (Signed)
Requested Prescriptions  Refused Prescriptions Disp Refills   rosuvastatin (CRESTOR) 5 MG tablet [Pharmacy Med Name: ROSUVASTATIN CALCIUM 5 MG TAB] 90 tablet 0    Sig: TAKE 1 TABLET (5 MG TOTAL) BY MOUTH DAILY.     Cardiovascular:  Antilipid - Statins 2 Failed - 09/13/2022 12:21 PM      Failed - Lipid Panel in normal range within the last 12 months    Cholesterol, Total  Date Value Ref Range Status  06/01/2022 272 (H) 100 - 199 mg/dL Final   LDL Chol Calc (NIH)  Date Value Ref Range Status  06/01/2022 175 (H) 0 - 99 mg/dL Final   HDL  Date Value Ref Range Status  06/01/2022 72 >39 mg/dL Final   Triglycerides  Date Value Ref Range Status  06/01/2022 139 0 - 149 mg/dL Final         Passed - Cr in normal range and within 360 days    Creatinine  Date Value Ref Range Status  07/01/2011 1.02 0.60 - 1.30 mg/dL Final   Creatinine, Ser  Date Value Ref Range Status  06/01/2022 0.71 0.57 - 1.00 mg/dL Final         Passed - Patient is not pregnant      Passed - Valid encounter within last 12 months    Recent Outpatient Visits           2 weeks ago Coronary artery disease involving native coronary artery of native heart without angina pectoris   [place] [facility]   3 months ago Transient vision disturbance   [facility]   10 months ago Acute maxillary sinusitis, recurrence not specified   [place] [facility], [facility]   1 year ago Annual physical exam   [facility] MD   1 year ago Recurrent vulvovaginal herpes simplex   [place] [facility], [facility]       Future Appointments             In 3 months [name], MD [facility] at  [facility], [facility]

## 2022-09-16 ENCOUNTER — Encounter: Payer: Medicare HMO | Admitting: *Deleted

## 2022-09-16 DIAGNOSIS — I252 Old myocardial infarction: Secondary | ICD-10-CM | POA: Diagnosis not present

## 2022-09-16 DIAGNOSIS — I2510 Atherosclerotic heart disease of native coronary artery without angina pectoris: Secondary | ICD-10-CM | POA: Diagnosis not present

## 2022-09-16 DIAGNOSIS — Z48812 Encounter for surgical aftercare following surgery on the circulatory system: Secondary | ICD-10-CM | POA: Diagnosis not present

## 2022-09-16 DIAGNOSIS — I213 ST elevation (STEMI) myocardial infarction of unspecified site: Secondary | ICD-10-CM

## 2022-09-16 DIAGNOSIS — Z955 Presence of coronary angioplasty implant and graft: Secondary | ICD-10-CM | POA: Diagnosis not present

## 2022-09-16 NOTE — Progress Notes (Signed)
Daily Session Note  Patient Details  Name: Kirsten Moore MRN: [id number] Date of Birth: Aug 11, 1955 Referring Provider:   Flowsheet Row Cardiac Rehab from 09/07/2022 in [facility] Cardiac and Pulmonary Rehab  Referring Provider Dr. Elizabeth Sauer, MD       Encounter Date: 09/16/2022  Check In:  Session Check In - 09/16/22 1109       Check-In   Supervising physician immediately available to respond to emergencies See telemetry face sheet for immediately available ER MD    Location [facility]-Cardiac & Pulmonary Rehab    Staff Present Lanny Hurst, RN, ADN;Kristen Coble, RN,BC,MSN;Joseph Reino [place], [place]    Virtual Visit No    Medication changes reported     No    Fall or balance concerns reported    No    Warm-up and Cool-down Performed on first and last piece of equipment    Resistance Training Performed Yes    VAD Patient? No    PAD/SET Patient? No      Pain Assessment   Currently in Pain? No/denies                Social History   Tobacco Use  Smoking Status Former   Packs/day: 1.00   Years: 25.00   Additional pack years: 0.00   Total pack years: 25.00   Types: Cigarettes   Quit date: 09/01/2002   Years since quitting: 20.0  Smokeless Tobacco Never    Goals Met:  Independence with exercise equipment Exercise tolerated well No report of concerns or symptoms today Strength training completed today  Goals Unmet:  Not Applicable  Comments: First full day of exercise!  Patient was oriented to gym and equipment including functions, settings, policies, and procedures.  Patient's individual exercise prescription and treatment plan were reviewed.  All starting workloads were established based on the results of the 6 minute walk test done at initial orientation visit.  The plan for exercise progression was also introduced and progression will be customized based on patient's performance and goals.    Dr. Bethann Punches is Medical Director for [facility] Cardiac  Rehabilitation.  Dr. Vida Rigger is Medical Director for [facility] Pulmonary Rehabilitation.

## 2022-09-21 ENCOUNTER — Encounter: Payer: Medicare HMO | Admitting: *Deleted

## 2022-09-21 DIAGNOSIS — I213 ST elevation (STEMI) myocardial infarction of unspecified site: Secondary | ICD-10-CM

## 2022-09-21 DIAGNOSIS — Z48812 Encounter for surgical aftercare following surgery on the circulatory system: Secondary | ICD-10-CM | POA: Diagnosis not present

## 2022-09-21 DIAGNOSIS — I2510 Atherosclerotic heart disease of native coronary artery without angina pectoris: Secondary | ICD-10-CM | POA: Diagnosis not present

## 2022-09-21 DIAGNOSIS — I252 Old myocardial infarction: Secondary | ICD-10-CM | POA: Diagnosis not present

## 2022-09-21 DIAGNOSIS — Z955 Presence of coronary angioplasty implant and graft: Secondary | ICD-10-CM | POA: Diagnosis not present

## 2022-09-21 NOTE — Progress Notes (Signed)
Daily Session Note  Patient Details  Name: Kirsten Moore MRN: [id number] Date of Birth: 06-02-1955 Referring Provider:   Flowsheet Row Cardiac Rehab from 09/07/2022 in [facility] Cardiac and Pulmonary Rehab  Referring Provider Dr. Elizabeth Sauer, MD       Encounter Date: 09/21/2022  Check In:  Session Check In - 09/21/22 1002       Check-In   Supervising physician immediately available to respond to emergencies See telemetry face sheet for immediately available ER MD    Location [facility]-Cardiac & Pulmonary Rehab    Staff Present Lanny Hurst, RN, Franki Monte, BS, ACSM CEP, Exercise Physiologist;Kristen Coble, RN,BC,MSN    Virtual Visit No    Medication changes reported     No    Fall or balance concerns reported    No    Warm-up and Cool-down Performed on first and last piece of equipment    Resistance Training Performed Yes    VAD Patient? No    PAD/SET Patient? No      Pain Assessment   Currently in Pain? No/denies                Social History   Tobacco Use  Smoking Status Former   Packs/day: 1.00   Years: 25.00   Additional pack years: 0.00   Total pack years: 25.00   Types: Cigarettes   Quit date: 09/01/2002   Years since quitting: 20.0  Smokeless Tobacco Never    Goals Met:  Independence with exercise equipment Exercise tolerated well No report of concerns or symptoms today Strength training completed today  Goals Unmet:  Not Applicable  Comments: Pt able to follow exercise prescription today without complaint.  Will continue to monitor for progression.    Dr. Bethann Punches is Medical Director for [facility] Cardiac Rehabilitation.  Dr. Vida Rigger is Medical Director for [facility] Pulmonary Rehabilitation.

## 2022-09-23 ENCOUNTER — Encounter: Payer: Medicare HMO | Admitting: *Deleted

## 2022-09-23 DIAGNOSIS — I213 ST elevation (STEMI) myocardial infarction of unspecified site: Secondary | ICD-10-CM

## 2022-09-23 DIAGNOSIS — I2510 Atherosclerotic heart disease of native coronary artery without angina pectoris: Secondary | ICD-10-CM | POA: Diagnosis not present

## 2022-09-23 DIAGNOSIS — Z48812 Encounter for surgical aftercare following surgery on the circulatory system: Secondary | ICD-10-CM | POA: Diagnosis not present

## 2022-09-23 DIAGNOSIS — Z955 Presence of coronary angioplasty implant and graft: Secondary | ICD-10-CM | POA: Diagnosis not present

## 2022-09-23 DIAGNOSIS — I252 Old myocardial infarction: Secondary | ICD-10-CM | POA: Diagnosis not present

## 2022-09-23 NOTE — Progress Notes (Signed)
Daily Session Note  Patient Details  Name: Kirsten Moore MRN: [id number] Date of Birth: 1955-11-01 Referring Provider:   Flowsheet Row Cardiac Rehab from 09/07/2022 in [facility] Cardiac and Pulmonary Rehab  Referring Provider Dr. Elizabeth Sauer, MD       Encounter Date: 09/23/2022  Check In:  Session Check In - 09/23/22 0949       Check-In   Supervising physician immediately available to respond to emergencies See telemetry face sheet for immediately available ER MD    Location [facility]-Cardiac & Pulmonary Rehab    Staff Present Lanny Hurst, RN, ADN;Kristen Coble, RN,BC,MSN;Noah Tickle, BS, Exercise Physiologist    Virtual Visit No    Medication changes reported     No    Fall or balance concerns reported    No    Warm-up and Cool-down Performed on first and last piece of equipment    Resistance Training Performed Yes    VAD Patient? No    PAD/SET Patient? No      Pain Assessment   Currently in Pain? No/denies                Social History   Tobacco Use  Smoking Status Former   Packs/day: 1.00   Years: 25.00   Additional pack years: 0.00   Total pack years: 25.00   Types: Cigarettes   Quit date: 09/01/2002   Years since quitting: 20.0  Smokeless Tobacco Never    Goals Met:  Independence with exercise equipment Exercise tolerated well No report of concerns or symptoms today Strength training completed today  Goals Unmet:  Not Applicable  Comments: Pt able to follow exercise prescription today without complaint.  Will continue to monitor for progression.    Dr. Bethann Punches is Medical Director for [facility] Cardiac Rehabilitation.  Dr. Vida Rigger is Medical Director for [facility] Pulmonary Rehabilitation.

## 2022-09-28 ENCOUNTER — Encounter: Payer: Medicare HMO | Attending: Family Medicine | Admitting: *Deleted

## 2022-09-28 DIAGNOSIS — Z955 Presence of coronary angioplasty implant and graft: Secondary | ICD-10-CM | POA: Diagnosis not present

## 2022-09-28 DIAGNOSIS — I252 Old myocardial infarction: Secondary | ICD-10-CM | POA: Insufficient documentation

## 2022-09-28 DIAGNOSIS — Z48812 Encounter for surgical aftercare following surgery on the circulatory system: Secondary | ICD-10-CM | POA: Diagnosis not present

## 2022-09-28 DIAGNOSIS — I213 ST elevation (STEMI) myocardial infarction of unspecified site: Secondary | ICD-10-CM | POA: Diagnosis not present

## 2022-09-28 NOTE — Progress Notes (Signed)
Daily Session Note  Patient Details  Name: Kirsten Moore MRN: [id number] Date of Birth: 05-30-1955 Referring Provider:   Flowsheet Row Cardiac Rehab from 09/07/2022 in [facility] Cardiac and Pulmonary Rehab  Referring Provider Dr. Elizabeth Sauer, MD       Encounter Date: 09/28/2022  Check In:  Session Check In - 09/28/22 0951       Check-In   Supervising physician immediately available to respond to emergencies See telemetry face sheet for immediately available ER MD    Location [facility]-Cardiac & Pulmonary Rehab    Staff Present Lanny Hurst, RN, ADN;Cherre Robins, PhD, RN, CNS, CEN   [place], MS   Virtual Visit No    Medication changes reported     No    Fall or balance concerns reported    No    Warm-up and Cool-down Performed on first and last piece of equipment    Resistance Training Performed Yes    VAD Patient? No    PAD/SET Patient? No      Pain Assessment   Currently in Pain? No/denies                Social History   Tobacco Use  Smoking Status Former   Packs/day: 1.00   Years: 25.00   Additional pack years: 0.00   Total pack years: 25.00   Types: Cigarettes   Quit date: 09/01/2002   Years since quitting: 20.0  Smokeless Tobacco Never    Goals Met:  Independence with exercise equipment Exercise tolerated well No report of concerns or symptoms today Strength training completed today  Goals Unmet:  Not Applicable  Comments: Pt able to follow exercise prescription today without complaint.  Will continue to monitor for progression.    Dr. Bethann Punches is Medical Director for [facility] Cardiac Rehabilitation.  Dr. Vida Rigger is Medical Director for [facility] Pulmonary Rehabilitation.

## 2022-09-30 ENCOUNTER — Encounter: Payer: Medicare HMO | Admitting: *Deleted

## 2022-09-30 DIAGNOSIS — I2111 ST elevation (STEMI) myocardial infarction involving right coronary artery: Secondary | ICD-10-CM | POA: Diagnosis not present

## 2022-09-30 DIAGNOSIS — R55 Syncope and collapse: Secondary | ICD-10-CM | POA: Diagnosis not present

## 2022-09-30 DIAGNOSIS — I10 Essential (primary) hypertension: Secondary | ICD-10-CM | POA: Diagnosis not present

## 2022-09-30 DIAGNOSIS — I213 ST elevation (STEMI) myocardial infarction of unspecified site: Secondary | ICD-10-CM | POA: Diagnosis not present

## 2022-09-30 DIAGNOSIS — R0609 Other forms of dyspnea: Secondary | ICD-10-CM | POA: Diagnosis not present

## 2022-09-30 DIAGNOSIS — Z955 Presence of coronary angioplasty implant and graft: Secondary | ICD-10-CM | POA: Diagnosis not present

## 2022-09-30 DIAGNOSIS — E785 Hyperlipidemia, unspecified: Secondary | ICD-10-CM | POA: Diagnosis not present

## 2022-09-30 DIAGNOSIS — R42 Dizziness and giddiness: Secondary | ICD-10-CM | POA: Diagnosis not present

## 2022-10-05 DIAGNOSIS — I2111 ST elevation (STEMI) myocardial infarction involving right coronary artery: Secondary | ICD-10-CM | POA: Diagnosis not present

## 2022-10-05 DIAGNOSIS — R42 Dizziness and giddiness: Secondary | ICD-10-CM | POA: Diagnosis not present

## 2022-10-05 DIAGNOSIS — Z955 Presence of coronary angioplasty implant and graft: Secondary | ICD-10-CM | POA: Diagnosis not present

## 2022-10-06 ENCOUNTER — Encounter: Payer: Self-pay | Admitting: *Deleted

## 2022-10-06 DIAGNOSIS — I213 ST elevation (STEMI) myocardial infarction of unspecified site: Secondary | ICD-10-CM

## 2022-10-06 NOTE — Progress Notes (Signed)
Cardiac Individual Treatment Plan  Patient Details  Name: Kirsten Moore MRN: [id number] Date of Birth: Mar 01, 1956 Referring Provider:   Flowsheet Row Cardiac Rehab from 09/07/2022 in [facility] Cardiac and Pulmonary Rehab  Referring Provider Dr. Elizabeth Sauer, MD       Initial Encounter Date:  Flowsheet Row Cardiac Rehab from 09/07/2022 in [facility] Cardiac and Pulmonary Rehab  Date 09/07/22       Visit Diagnosis: ST elevation myocardial infarction (STEMI), unspecified artery (HCC)  Patient's Home Medications on Admission:  Current Outpatient Medications:    aspirin 81 MG chewable tablet, Chew by mouth., Disp: , Rfl:    aspirin EC 81 MG tablet, Take 81 mg by mouth daily. Swallow whole. (Patient not taking: Reported on 09/02/2022), Disp: , Rfl:    losartan (COZAAR) 25 MG tablet, Take 1 tablet (25 mg total) by mouth daily., Disp: 30 tablet, Rfl: 0   metoprolol succinate (TOPROL-XL) 25 MG 24 hr tablet, Take 1 tablet by mouth daily., Disp: , Rfl:    rosuvastatin (CRESTOR) 20 MG tablet, Take 1 tablet by mouth daily., Disp: , Rfl:    ticagrelor (BRILINTA) 90 MG TABS tablet, Take 90 mg by mouth 2 (two) times daily., Disp: , Rfl:    valACYclovir (VALTREX) 500 MG tablet, Take 1 tablet (500 mg total) by mouth 2 (two) times daily., Disp: 30 tablet, Rfl: 6   valACYclovir (VALTREX) 500 MG tablet, Take by mouth. (Patient not taking: Reported on 09/02/2022), Disp: , Rfl:   Past Medical History: Past Medical History:  Diagnosis Date   Hypertension     Tobacco Use: Social History   Tobacco Use  Smoking Status Former   Packs/day: 1.00   Years: 25.00   Additional pack years: 0.00   Total pack years: 25.00   Types: Cigarettes   Quit date: 09/01/2002   Years since quitting: 20.1  Smokeless Tobacco Never    Labs: Review Flowsheet  More data exists      Latest Ref Rng & Units 04/04/2018 04/19/2019 06/12/2020 08/13/2021 06/01/2022  Labs for ITP Cardiac and Pulmonary Rehab  Cholestrol 100 - 199 mg/dL 409   811  914  782  956   LDL (calc) 0 - 99 mg/dL 213  086  578  469  629   HDL-C >39 mg/dL 78  82  73  77  72   Trlycerides 0 - 149 mg/dL 528  413  244  010  272      Exercise Target Goals: Exercise Program Goal: Individual exercise prescription set using results from initial 6 min walk test and THRR while considering  patient's activity barriers and safety.   Exercise Prescription Goal: Initial exercise prescription builds to 30-45 minutes a day of aerobic activity, 2-3 days per week.  Home exercise guidelines will be given to patient during program as part of exercise prescription that the participant will acknowledge.   Education: Aerobic Exercise: - Group verbal and visual presentation on the components of exercise prescription. Introduces F.I.T.T principle from ACSM for exercise prescriptions.  Reviews F.I.T.T. principles of aerobic exercise including progression. Written material given at graduation.   Education: Resistance Exercise: - Group verbal and visual presentation on the components of exercise prescription. Introduces F.I.T.T principle from ACSM for exercise prescriptions  Reviews F.I.T.T. principles of resistance exercise including progression. Written material given at graduation.    Education: Exercise & Equipment Safety: - Individual verbal instruction and demonstration of equipment use and safety with use of the equipment. Flowsheet Row Cardiac Rehab  from 09/07/2022 in [facility] Cardiac and Pulmonary Rehab  Date 09/02/22  Educator [facility]  Instruction Review Code 1- Verbalizes Understanding       Education: Exercise Physiology & General Exercise Guidelines: - Group verbal and written instruction with models to review the exercise physiology of the cardiovascular system and associated critical values. Provides general exercise guidelines with specific guidelines to those with heart or lung disease.    Education: Flexibility, Balance, Mind/Body Relaxation: - Group verbal and  visual presentation with interactive activity on the components of exercise prescription. Introduces F.I.T.T principle from ACSM for exercise prescriptions. Reviews F.I.T.T. principles of flexibility and balance exercise training including progression. Also discusses the mind body connection.  Reviews various relaxation techniques to help reduce and manage stress (i.e. Deep breathing, progressive muscle relaxation, and visualization). Balance handout provided to take home. Written material given at graduation.   Activity Barriers & Risk Stratification:  Activity Barriers & Cardiac Risk Stratification - 09/07/22 1535       Activity Barriers & Cardiac Risk Stratification   Activity Barriers Other (comment)    Comments Occasional hip and knee pain    Cardiac Risk Stratification Moderate             6 Minute Walk:  6 Minute Walk     Row Name 09/07/22 1534         6 Minute Walk   Phase Initial     Distance 1205 feet     Walk Time 6 minutes     # of Rest Breaks 0     MPH 2.28     METS 2.73     RPE 11     Perceived Dyspnea  0     VO2 Peak 9.57     Symptoms No     Resting HR 65 bpm     Resting BP 118/68     Resting Oxygen Saturation  98 %     Exercise Oxygen Saturation  during 6 min walk 94 %     Max Ex. HR 90 bpm     Max Ex. BP 138/80     2 Minute Post BP 132/76              Oxygen Initial Assessment:   Oxygen Re-Evaluation:   Oxygen Discharge (Final Oxygen Re-Evaluation):   Initial Exercise Prescription:  Initial Exercise Prescription - 09/07/22 1500       Date of Initial Exercise RX and Referring Provider   Date 09/07/22    Referring Provider Dr. Elizabeth Sauer, MD      Oxygen   Maintain Oxygen Saturation 88% or higher      Treadmill   MPH 2    Grade 0.5    Minutes 15    METs 2.67      Recumbant Bike   Level 2    RPM 50    Watts 14    Minutes 15    METs 2.73      NuStep   Level 2    SPM 80    Minutes 15    METs 2.73      Track   Laps  32    Minutes 15    METs 2.74      Prescription Details   Frequency (times per week) 2    Duration Progress to 30 minutes of continuous aerobic without signs/symptoms of physical distress      Intensity   THRR 40-80% of Max Heartrate 99-134    Ratings of  Perceived Exertion 11-13    Perceived Dyspnea 0-4      Progression   Progression Continue to progress workloads to maintain intensity without signs/symptoms of physical distress.      Resistance Training   Training Prescription Yes    Weight 2 lb    Reps 10-15             Perform Capillary Blood Glucose checks as needed.  Exercise Prescription Changes:   Exercise Prescription Changes     Row Name 09/07/22 1500 09/24/22 1400           Response to Exercise   Blood Pressure (Admit) 118/68 110/62      Blood Pressure (Exercise) 138/80 132/90      Blood Pressure (Exit) 132/76 104/62      Heart Rate (Admit) 65 bpm 62 bpm      Heart Rate (Exercise) 90 bpm 105 bpm      Heart Rate (Exit) 60 bpm 70 bpm      Oxygen Saturation (Admit) 98 % --      Oxygen Saturation (Exercise) 94 % --      Rating of Perceived Exertion (Exercise) 11 11      Perceived Dyspnea (Exercise) 0 --      Symptoms none none      Comments Results First two days of exercise      Duration -- Continue with 30 min of aerobic exercise without signs/symptoms of physical distress.      Intensity -- THRR unchanged        Progression   Progression -- Continue to progress workloads to maintain intensity without signs/symptoms of physical distress.      Average METs -- 2.3        Resistance Training   Training Prescription -- Yes      Weight -- 2 lb      Reps -- 10-15        Interval Training   Interval Training -- No        Treadmill   MPH -- 2      Grade -- 0.5      Minutes -- 15      METs -- 2.67        NuStep   Level -- 2      Minutes -- 15      METs -- 2.1        Oxygen   Maintain Oxygen Saturation -- 88% or higher                Exercise Comments:   Exercise Comments     Row Name 09/16/22 1110           Exercise Comments First full day of exercise!  Patient was oriented to gym and equipment including functions, settings, policies, and procedures.  Patient's individual exercise prescription and treatment plan were reviewed.  All starting workloads were established based on the results of the 6 minute walk test done at initial orientation visit.  The plan for exercise progression was also introduced and progression will be customized based on patient's performance and goals.                Exercise Goals and Review:   Exercise Goals     Row Name 09/07/22 1538             Exercise Goals   Increase Physical Activity Yes       Intervention Provide advice, education, support and counseling about physical  activity/exercise needs.;Develop an individualized exercise prescription for aerobic and resistive training based on initial evaluation findings, risk stratification, comorbidities and participant's personal goals.       Expected Outcomes Long Term: Add in home exercise to make exercise part of routine and to increase amount of physical activity.;Long Term: Exercising regularly at least 3-5 days a week.;Short Term: Attend rehab on a regular basis to increase amount of physical activity.       Increase Strength and Stamina Yes       Intervention Provide advice, education, support and counseling about physical activity/exercise needs.;Develop an individualized exercise prescription for aerobic and resistive training based on initial evaluation findings, risk stratification, comorbidities and participant's personal goals.       Expected Outcomes Short Term: Increase workloads from initial exercise prescription for resistance, speed, and METs.;Short Term: Perform resistance training exercises routinely during rehab and add in resistance training at home;Long Term: Improve cardiorespiratory fitness, muscular  endurance and strength as measured by increased METs and functional capacity ( )       Able to understand and use rate of perceived exertion (RPE) scale Yes       Intervention Provide education and explanation on how to use RPE scale       Expected Outcomes Short Term: Able to use RPE daily in rehab to express subjective intensity level;Long Term:  Able to use RPE to guide intensity level when exercising independently       Able to understand and use Dyspnea scale Yes       Intervention Provide education and explanation on how to use Dyspnea scale       Expected Outcomes Short Term: Able to use Dyspnea scale daily in rehab to express subjective sense of shortness of breath during exertion;Long Term: Able to use Dyspnea scale to guide intensity level when exercising independently       Knowledge and understanding of Target Heart Rate Range (THRR) Yes       Intervention Provide education and explanation of THRR including how the numbers were predicted and where they are located for reference       Expected Outcomes Short Term: Able to state/look up THRR;Long Term: Able to use THRR to govern intensity when exercising independently;Short Term: Able to use daily as guideline for intensity in rehab       Able to check pulse independently Yes       Intervention Provide education and demonstration on how to check pulse in carotid and radial arteries.;Review the importance of being able to check your own pulse for safety during independent exercise       Expected Outcomes Short Term: Able to explain why pulse checking is important during independent exercise;Long Term: Able to check pulse independently and accurately       Understanding of Exercise Prescription Yes       Intervention Provide education, explanation, and written materials on patient's individual exercise prescription       Expected Outcomes Short Term: Able to explain program exercise prescription;Long Term: Able to explain home exercise  prescription to exercise independently                Exercise Goals Re-Evaluation :  Exercise Goals Re-Evaluation     Row Name 09/16/22 1110 09/24/22 1431           Exercise Goal Re-Evaluation   Exercise Goals Review Able to understand and use rate of perceived exertion (RPE) scale;Able to understand and use Dyspnea scale;Knowledge and understanding of  Target Heart Rate Range (THRR);Understanding of Exercise Prescription Understanding of Exercise Prescription;Increase Physical Activity;Increase Strength and Stamina      Comments Reviewed RPE  and dyspnea scale, THR and program prescription with pt today.  Pt voiced understanding and was given a copy of goals to take home. Sanyi is off to a good start in the program. During her first two sessions of rehab she walked the treadmill at a speed of 2 mph with a o.5% incline. She also worked on the T4 nustep at level 2 and did well with 2 lb hand weights for resistance training. We will continue to monitor her progress.      Expected Outcomes Short: Use RPE daily to regulate intensity. Long: Follow program prescription in THR. Short: Continue to follow initial exercise prescription. Long: Continue to follow program prescription in THR.               Discharge Exercise Prescription (Final Exercise Prescription Changes):  Exercise Prescription Changes - 09/24/22 1400       Response to Exercise   Blood Pressure (Admit) 110/62    Blood Pressure (Exercise) 132/90    Blood Pressure (Exit) 104/62    Heart Rate (Admit) 62 bpm    Heart Rate (Exercise) 105 bpm    Heart Rate (Exit) 70 bpm    Rating of Perceived Exertion (Exercise) 11    Symptoms none    Comments First two days of exercise    Duration Continue with 30 min of aerobic exercise without signs/symptoms of physical distress.    Intensity THRR unchanged      Progression   Progression Continue to progress workloads to maintain intensity without signs/symptoms of physical distress.     Average METs 2.3      Resistance Training   Training Prescription Yes    Weight 2 lb    Reps 10-15      Interval Training   Interval Training No      Treadmill   MPH 2    Grade 0.5    Minutes 15    METs 2.67      NuStep   Level 2    Minutes 15    METs 2.1      Oxygen   Maintain Oxygen Saturation 88% or higher             Nutrition:  Target Goals: Understanding of nutrition guidelines, daily intake of sodium 1500mg , cholesterol 200mg , calories 30% from fat and 7% or less from saturated fats, daily to have 5 or more servings of fruits and vegetables.  Education: All About Nutrition: -Group instruction provided by verbal, written material, interactive activities, discussions, models, and posters to present general guidelines for heart healthy nutrition including fat, fiber, MyPlate, the role of sodium in heart healthy nutrition, utilization of the nutrition label, and utilization of this knowledge for meal planning. Follow up email sent as well. Written material given at graduation. Flowsheet Row Cardiac Rehab from 09/07/2022 in [facility] Cardiac and Pulmonary Rehab  Education need identified 09/07/22       Biometrics:  Pre Biometrics - 09/07/22 1539       Pre Biometrics   Height 5' 0.5" (1.537 m)    Weight 135 lb (61.2 kg)    Waist Circumference 32.5 inches    Hip Circumference 39 inches    Waist to Hip Ratio 0.83 %    BMI (Calculated) 25.92    Single Leg Stand 12.7 seconds  Nutrition Therapy Plan and Nutrition Goals:  Nutrition Therapy & Goals - 09/07/22 1521       Intervention Plan   Intervention Prescribe, educate and counsel regarding individualized specific dietary modifications aiming towards targeted core components such as weight, hypertension, lipid management, diabetes, heart failure and other comorbidities.    Expected Outcomes Short Term Goal: Understand basic principles of dietary content, such as calories, fat, sodium,  cholesterol and nutrients.;Short Term Goal: A plan has been developed with personal nutrition goals set during dietitian appointment.;Long Term Goal: Adherence to prescribed nutrition plan.             Nutrition Assessments:  MEDIFICTS Score Key: ?70 Need to make dietary changes  40-70 Heart Healthy Diet ? 40 Therapeutic Level Cholesterol Diet  Flowsheet Row Cardiac Rehab from 09/07/2022 in [facility] Cardiac and Pulmonary Rehab  Picture Your Plate Total Score on Admission 53      Picture Your Plate Scores: <16 Unhealthy dietary pattern with much room for improvement. 41-50 Dietary pattern unlikely to meet recommendations for good health and room for improvement. 51-60 More healthful dietary pattern, with some room for improvement.  >60 Healthy dietary pattern, although there may be some specific behaviors that could be improved.    Nutrition Goals Re-Evaluation:   Nutrition Goals Discharge (Final Nutrition Goals Re-Evaluation):   Psychosocial: Target Goals: Acknowledge presence or absence of significant depression and/or stress, maximize coping skills, provide positive support system. Participant is able to verbalize types and ability to use techniques and skills needed for reducing stress and depression.   Education: Stress, Anxiety, and Depression - Group verbal and visual presentation to define topics covered.  Reviews how body is impacted by stress, anxiety, and depression.  Also discusses healthy ways to reduce stress and to treat/manage anxiety and depression.  Written material given at graduation.   Education: Sleep Hygiene -Provides group verbal and written instruction about how sleep can affect your health.  Define sleep hygiene, discuss sleep cycles and impact of sleep habits. Review good sleep hygiene tips.    Initial Review & Psychosocial Screening:  Initial Psych Review & Screening - 09/02/22 1428       Initial Review   Current issues with None Identified       Family Dynamics   Good Support System? Yes    Comments She can look to her daughter son and best friend. Her family is near byto support her. Her husband died two years ago.      Barriers   Psychosocial barriers to participate in program The patient should benefit from training in stress management and relaxation.;There are no identifiable barriers or psychosocial needs.      Screening Interventions   Interventions To provide support and resources with identified psychosocial needs;Provide feedback about the scores to participant;Encouraged to exercise    Expected Outcomes Short Term goal: Utilizing psychosocial counselor, staff and physician to assist with identification of specific Stressors or current issues interfering with healing process. Setting desired goal for each stressor or current issue identified.;Long Term Goal: Stressors or current issues are controlled or eliminated.;Short Term goal: Identification and review with participant of any Quality of Life or Depression concerns found by scoring the questionnaire.;Long Term goal: The participant improves quality of Life and PHQ9 Scores as seen by post scores and/or verbalization of changes             Quality of Life Scores:   Quality of Life - 09/07/22 1522       Quality  of Life   Select Quality of Life      Quality of Life Scores   Health/Function Pre 24.67 %    Socioeconomic Pre 27.5 %    Psych/Spiritual Pre 24.64 %    Family Pre 22.8 %    GLOBAL Pre 24.97 %            Scores of 19 and below usually indicate a poorer quality of life in these areas.  A difference of  2-3 points is a clinically meaningful difference.  A difference of 2-3 points in the total score of the Quality of Life Index has been associated with significant improvement in overall quality of life, self-image, physical symptoms, and general health in studies assessing change in quality of life.  PHQ-9: Review Flowsheet  More data exists       09/07/2022 08/27/2022 08/12/2022 06/01/2022 10/31/2021  Depression screen PHQ 2/9  Decreased Interest 1 0 0 0 0  Down, Depressed, Hopeless 0 0 0 0 0  PHQ - 2 Score 1 0 0 0 0  Altered sleeping 0 0 0 0 0  Tired, decreased energy 1 0 0 0 0  Change in appetite 1 0 0 0 0  Feeling bad or failure about yourself  1 0 0 0 0  Trouble concentrating 0 0 0 0 0  Moving slowly or fidgety/restless 0 0 0 0 0  Suicidal thoughts 0 0 0 0 0  PHQ-9 Score 4 0 0 0 0  Difficult doing work/chores Somewhat difficult Not difficult at all Not difficult at all Not difficult at all Not difficult at all   Interpretation of Total Score  Total Score Depression Severity:  1-4 = Minimal depression, 5-9 = Mild depression, 10-14 = Moderate depression, 15-19 = Moderately severe depression, 20-27 = Severe depression   Psychosocial Evaluation and Intervention:  Psychosocial Evaluation - 09/02/22 1430       Psychosocial Evaluation & Interventions   Interventions Encouraged to exercise with the program and follow exercise prescription;Relaxation education;Stress management education    Comments She can look to her daughter son and best friend. Her family is near byto support her. Her husband died two years ago. 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    Expected Outcomes Short: Start Heart[facility] to help with mood. Long: Maintain a healthy mental state    Continue Psychosocial Services  Follow up required by staff             Psychosocial Re-Evaluation:   Psychosocial Discharge (Final Psychosocial Re-Evaluation):   Vocational Rehabilitation: Provide vocational rehab assistance to qualifying candidates.   Vocational Rehab Evaluation & Intervention:   Education: Education Goals: Education classes will be provided on a variety of topics geared toward better understanding of heart health and risk factor modification. Participant will state understanding/return demonstration of topics presented as  noted by education test scores.  Learning Barriers/Preferences:  Learning Barriers/Preferences - 09/02/22 1426       Learning Barriers/Preferences   Learning Barriers None    Learning Preferences None             General Cardiac Education Topics:  AED/CPR: - Group verbal and written instruction with the use of models to demonstrate the basic use of the AED with the basic ABC's of resuscitation.   Anatomy and Cardiac Procedures: - Group verbal and visual presentation and models provide information about basic cardiac anatomy and function. Reviews the testing methods done to diagnose heart disease  and the outcomes of the test results. Describes the treatment choices: Medical Management, Angioplasty, or Coronary Bypass Surgery for treating various heart conditions including Myocardial Infarction, Angina, Valve Disease, and Cardiac Arrhythmias.  Written material given at graduation.   Medication Safety: - Group verbal and visual instruction to review commonly prescribed medications for heart and lung disease. Reviews the medication, class of the drug, and side effects. Includes the steps to properly store meds and maintain the prescription regimen.  Written material given at graduation.   Intimacy: - Group verbal instruction through game format to discuss how heart and lung disease can affect sexual intimacy. Written material given at graduation..   Know Your Numbers and Heart Failure: - Group verbal and visual instruction to discuss disease risk factors for cardiac and pulmonary disease and treatment options.  Reviews associated critical values for Overweight/Obesity, Hypertension, Cholesterol, and Diabetes.  Discusses basics of heart failure: signs/symptoms and treatments.  Introduces Heart Failure Zone chart for action plan for heart failure.  Written material given at graduation.   Infection Prevention: - Provides verbal and written material to individual with discussion of  infection control including proper hand washing and proper equipment cleaning during exercise session. Flowsheet Row Cardiac Rehab from 09/07/2022 in [facility] Cardiac and Pulmonary Rehab  Date 09/02/22  Educator [facility]  Instruction Review Code 1- Verbalizes Understanding       Falls Prevention: - Provides verbal and written material to individual with discussion of falls prevention and safety. Flowsheet Row Cardiac Rehab from 09/07/2022 in [facility] Cardiac and Pulmonary Rehab  Date 09/02/22  Educator [facility]  Instruction Review Code 1- Verbalizes Understanding       Other: -Provides group and verbal instruction on various topics (see comments)   Knowledge Questionnaire Score:  Knowledge Questionnaire Score - 09/07/22 1520       Knowledge Questionnaire Score   Pre Score 25/26             Core Components/Risk Factors/Patient Goals at Admission:  Personal Goals and Risk Factors at Admission - 09/02/22 1426       Core Components/Risk Factors/Patient Goals on Admission    Weight Management Yes;Weight Loss    Intervention Weight Management: Develop a combined nutrition and exercise program designed to reach desired caloric intake, while maintaining appropriate intake of nutrient and fiber, sodium and fats, and appropriate energy expenditure required for the weight goal.;Weight Management: Provide education and appropriate resources to help participant work on and attain dietary goals.;Weight Management/Obesity: Establish reasonable short term and long term weight goals.    Expected Outcomes Short Term: Continue to assess and modify interventions until short term weight is achieved;Long Term: Adherence to nutrition and physical activity/exercise program aimed toward attainment of established weight goal;Weight Loss: Understanding of general recommendations for a balanced deficit meal plan, which promotes 1-2 lb weight loss per week and includes a negative energy balance of [phone]  kcal/d;Understanding recommendations for meals to include 15-35% energy as protein, 25-35% energy from fat, 35-60% energy from carbohydrates, less than 200mg  of dietary cholesterol, 20-35 gm of total fiber daily;Understanding of distribution of calorie intake throughout the day with the consumption of 4-5 meals/snacks    Hypertension Yes    Intervention Provide education on lifestyle modifcations including regular physical activity/exercise, weight management, moderate sodium restriction and increased consumption of fresh fruit, vegetables, and low fat dairy, alcohol moderation, and smoking cessation.;Monitor prescription use compliance.    Expected Outcomes Short Term: Continued assessment and intervention until BP is < 140/78mm HG in  hypertensive participants. < 130/13mm HG in hypertensive participants with diabetes, heart failure or chronic kidney disease.;Long Term: Maintenance of blood pressure at goal levels.    Lipids Yes    Intervention Provide education and support for participant on nutrition & aerobic/resistive exercise along with prescribed medications to achieve LDL 70mg , HDL >40mg .    Expected Outcomes Long Term: Cholesterol controlled with medications as prescribed, with individualized exercise RX and with personalized nutrition plan. Value goals: LDL < 70mg , HDL > 40 mg.;Short Term: Participant states understanding of desired cholesterol values and is compliant with medications prescribed. Participant is following exercise prescription and nutrition guidelines.             Education:Diabetes - Individual verbal and written instruction to review signs/symptoms of diabetes, desired ranges of glucose level fasting, after meals and with exercise. Acknowledge that pre and post exercise glucose checks will be done for 3 sessions at entry of program.   Core Components/Risk Factors/Patient Goals Review:    Core Components/Risk Factors/Patient Goals at Discharge (Final Review):    ITP  Comments:  ITP Comments     Row Name 09/02/22 1425 09/07/22 1517 09/09/22 1125 09/16/22 1110 10/06/22 1459   ITP Comments Virtual Visit completed. Patient informed on EP and RD appointment and 6 Minute walk test. Patient also informed of patient health questionnaires on My Chart. Patient Verbalizes understanding. Visit diagnosis can be found in [facility] 08/16/2022. Completed and gym orientation. Initial ITP created and sent for review to Dr. Bethann Punches, Medical Director. 30 Day review completed. Medical Director ITP review done, changes made as directed, and signed approval by Medical Director.   new to program First full day of exercise!  Patient was oriented to gym and equipment including functions, settings, policies, and procedures.  Patient's individual exercise prescription and treatment plan were reviewed.  All starting workloads were established based on the results of the 6 minute walk test done at initial orientation visit.  The plan for exercise progression was also introduced and progression will be customized based on patient's performance and goals. 30 Day review completed. Medical Director ITP review done, changes made as directed, and signed approval by Medical Director.   new to program            Comments:

## 2022-10-07 ENCOUNTER — Encounter: Payer: Medicare HMO | Admitting: *Deleted

## 2022-10-07 DIAGNOSIS — I213 ST elevation (STEMI) myocardial infarction of unspecified site: Secondary | ICD-10-CM | POA: Diagnosis not present

## 2022-10-07 DIAGNOSIS — I252 Old myocardial infarction: Secondary | ICD-10-CM | POA: Diagnosis not present

## 2022-10-07 DIAGNOSIS — Z955 Presence of coronary angioplasty implant and graft: Secondary | ICD-10-CM | POA: Diagnosis not present

## 2022-10-07 DIAGNOSIS — Z48812 Encounter for surgical aftercare following surgery on the circulatory system: Secondary | ICD-10-CM | POA: Diagnosis not present

## 2022-10-07 NOTE — Progress Notes (Signed)
Daily Session Note  Patient Details  Name: Kirsten Moore MRN: [id number] Date of Birth: 12-Jan-1956 Referring Provider:   Flowsheet Row Cardiac Rehab from 09/07/2022 in [facility] Cardiac and Pulmonary Rehab  Referring Provider Dr. Elizabeth Sauer, MD       Encounter Date: 10/07/2022  Check In:  Session Check In - 10/07/22 1007       Check-In   Supervising physician immediately available to respond to emergencies See telemetry face sheet for immediately available ER MD    Location [facility]-Cardiac & Pulmonary Rehab    Staff Present Lanny Hurst, RN, ADN;Kristen Coble, RN,BC,MSN;Krista Karleen Hampshire RN, BSN    Virtual Visit No    Medication changes reported     Yes    Comments d/c metoprolol    Fall or balance concerns reported    No    Warm-up and Cool-down Performed on first and last piece of equipment    Resistance Training Performed Yes    VAD Patient? No    PAD/SET Patient? No      Pain Assessment   Currently in Pain? No/denies                Social History   Tobacco Use  Smoking Status Former   Packs/day: 1.00   Years: 25.00   Additional pack years: 0.00   Total pack years: 25.00   Types: Cigarettes   Quit date: 09/01/2002   Years since quitting: 20.1  Smokeless Tobacco Never    Goals Met:  Independence with exercise equipment Exercise tolerated well No report of concerns or symptoms today Strength training completed today  Goals Unmet:  Not Applicable  Comments: Pt able to follow exercise prescription today without complaint.  Will continue to monitor for progression.    Dr. Bethann Punches is Medical Director for [facility] Cardiac Rehabilitation.  Dr. Vida Rigger is Medical Director for [facility] Pulmonary Rehabilitation.

## 2022-10-08 ENCOUNTER — Other Ambulatory Visit: Payer: Self-pay | Admitting: Family Medicine

## 2022-10-08 DIAGNOSIS — E7801 Familial hypercholesterolemia: Secondary | ICD-10-CM

## 2022-10-08 NOTE — Telephone Encounter (Signed)
No longer on current medication list at this dose Requested Prescriptions  Pending Prescriptions Disp Refills   rosuvastatin (CRESTOR) 5 MG tablet [Pharmacy Med Name: ROSUVASTATIN CALCIUM 5 MG TAB] 90 tablet 0    Sig: TAKE 1 TABLET (5 MG TOTAL) BY MOUTH DAILY.     Cardiovascular:  Antilipid - Statins 2 Failed - 10/08/2022  9:39 AM      Failed - Lipid Panel in normal range within the last 12 months    Cholesterol, Total  Date Value Ref Range Status  06/01/2022 272 (H) 100 - 199 mg/dL Final   LDL Chol Calc (NIH)  Date Value Ref Range Status  06/01/2022 175 (H) 0 - 99 mg/dL Final   HDL  Date Value Ref Range Status  06/01/2022 72 >39 mg/dL Final   Triglycerides  Date Value Ref Range Status  06/01/2022 139 0 - 149 mg/dL Final         Passed - Cr in normal range and within 360 days    Creatinine  Date Value Ref Range Status  07/01/2011 1.02 0.60 - 1.30 mg/dL Final   Creatinine, Ser  Date Value Ref Range Status  06/01/2022 0.71 0.57 - 1.00 mg/dL Final         Passed - Patient is not pregnant      Passed - Valid encounter within last 12 months    Recent Outpatient Visits           1 month ago Coronary artery disease involving native coronary artery of native heart without angina pectoris   [place] [facility]   4 months ago Transient vision disturbance   [facility]   11 months ago Acute maxillary sinusitis, recurrence not specified   [place] [facility], [facility]   1 year ago Annual physical exam   [facility]   1 year ago Recurrent vulvovaginal herpes simplex   [place] [facility], MD       Future Appointments             In 2 months [name], MD  [facility] at [facility]

## 2022-10-14 DIAGNOSIS — I471 Supraventricular tachycardia, unspecified: Secondary | ICD-10-CM | POA: Diagnosis not present

## 2022-10-14 DIAGNOSIS — R42 Dizziness and giddiness: Secondary | ICD-10-CM | POA: Diagnosis not present

## 2022-10-19 ENCOUNTER — Encounter: Payer: Medicare HMO | Admitting: *Deleted

## 2022-10-19 ENCOUNTER — Encounter: Payer: Self-pay | Admitting: Family Medicine

## 2022-10-19 ENCOUNTER — Ambulatory Visit (INDEPENDENT_AMBULATORY_CARE_PROVIDER_SITE_OTHER): Payer: Medicare HMO | Admitting: Family Medicine

## 2022-10-19 VITALS — BP 128/74 | HR 66 | Ht 60.0 in | Wt 132.0 lb

## 2022-10-19 DIAGNOSIS — N342 Other urethritis: Secondary | ICD-10-CM

## 2022-10-19 DIAGNOSIS — R300 Dysuria: Secondary | ICD-10-CM

## 2022-10-19 DIAGNOSIS — I213 ST elevation (STEMI) myocardial infarction of unspecified site: Secondary | ICD-10-CM | POA: Diagnosis not present

## 2022-10-19 DIAGNOSIS — I252 Old myocardial infarction: Secondary | ICD-10-CM | POA: Diagnosis not present

## 2022-10-19 DIAGNOSIS — Z48812 Encounter for surgical aftercare following surgery on the circulatory system: Secondary | ICD-10-CM | POA: Diagnosis not present

## 2022-10-19 DIAGNOSIS — Z955 Presence of coronary angioplasty implant and graft: Secondary | ICD-10-CM | POA: Diagnosis not present

## 2022-10-19 LAB — POCT URINALYSIS DIPSTICK
Bilirubin, UA: NEGATIVE
Glucose, UA: NEGATIVE
Ketones, UA: NEGATIVE
Nitrite, UA: POSITIVE
Protein, UA: POSITIVE — AB
Spec Grav, UA: 1.01 (ref 1.010–1.025)
Urobilinogen, UA: 0.2 E.U./dL
pH, UA: 6 (ref 5.0–8.0)

## 2022-10-19 MED ORDER — FLUCONAZOLE 150 MG PO TABS
150.0000 mg | ORAL_TABLET | Freq: Once | ORAL | 0 refills | Status: AC
Start: 2022-10-19 — End: 2022-10-19

## 2022-10-19 MED ORDER — CEPHALEXIN 500 MG PO CAPS: 500.0000 mg | ORAL_CAPSULE | Freq: Three times a day (TID) | ORAL | 0 refills | Status: DC

## 2022-10-19 NOTE — Progress Notes (Signed)
Daily Session Note  Patient Details  Name: Kirsten Moore MRN: [id number] Date of Birth: 05/23/1955 Referring Provider:   Flowsheet Row Cardiac Rehab from 09/07/2022 in [facility] Cardiac and Pulmonary Rehab  Referring Provider Dr. Elizabeth Sauer, MD       Encounter Date: 10/19/2022  Check In:  Session Check In - 10/19/22 1110       Check-In   Supervising physician immediately available to respond to emergencies See telemetry face sheet for immediately available ER MD    Location [facility]-Cardiac & Pulmonary Rehab    Staff Present Lanny Hurst, RN, ADN;Joseph Reino Kent, RCP,RRT,BSRT;Meredith Jewel Baize, RN BSN    Virtual Visit No    Medication changes reported     No    Fall or balance concerns reported    No    Warm-up and Cool-down Performed on first and last piece of equipment    Resistance Training Performed Yes    VAD Patient? No    PAD/SET Patient? No      Pain Assessment   Currently in Pain? No/denies                Social History   Tobacco Use  Smoking Status Former   Current packs/day: 0.00   Average packs/day: 1 pack/day for 25.0 years (25.0 ttl pk-yrs)   Types: Cigarettes   Start date: 08/31/1977   Quit date: 09/01/2002   Years since quitting: 20.1  Smokeless Tobacco Never    Goals Met:  Independence with exercise equipment Exercise tolerated well No report of concerns or symptoms today Strength training completed today  Goals Unmet:  Not Applicable  Comments: Pt able to follow exercise prescription today without complaint.  Will continue to monitor for progression.    Dr. Bethann Punches is Medical Director for [facility] Cardiac Rehabilitation.  Dr. Vida Rigger is Medical Director for [facility] Pulmonary Rehabilitation.

## 2022-10-19 NOTE — Progress Notes (Signed)
Date:  10/19/2022   Name:  Kirsten Moore   DOB:  [birth year]   MRN:  [id number]   Chief Complaint: Urinary Tract Infection (Uncomfortable feeling when urinates- no itching)  Urinary Tract Infection  This is a new problem. The current episode started in the past 7 days (friday). The problem occurs intermittently. The problem has been unchanged. Quality: discomfort. Associated symptoms include flank pain, frequency and urgency. Pertinent negatives include no chills, discharge or hematuria. Treatments tried: azo. The treatment provided moderate relief. There is no history of recurrent UTIs.    Lab Results  Component Value Date   NA 141 06/01/2022   K 4.3 06/01/2022   CO2 22 06/01/2022   GLUCOSE 88 06/01/2022   BUN 11 06/01/2022   CREATININE 0.71 06/01/2022   CALCIUM 9.2 06/01/2022   EGFR 93 06/01/2022   GFRNONAA 76 04/19/2019   Lab Results  Component Value Date   CHOL 272 (H) 06/01/2022   HDL 72 06/01/2022   LDLCALC 175 (H) 06/01/2022   TRIG 139 06/01/2022   CHOLHDL 3.5 04/04/2018   No results found for: "TSH" No results found for: "HGBA1C" Lab Results  Component Value Date   WBC 7.7 08/13/2021   HGB 14.1 08/13/2021   HCT 41.1 08/13/2021   MCV 97 08/13/2021   PLT 311 08/13/2021   Lab Results  Component Value Date   ALT 14 06/01/2022   AST 19 06/01/2022   ALKPHOS 106 06/01/2022   BILITOT 0.3 06/01/2022   No results found for: "25OHVITD2", "25OHVITD3", "VD25OH"   Review of Systems  Constitutional:  Negative for chills.  Respiratory:  Positive for shortness of breath. Negative for chest tightness and wheezing.   Cardiovascular:  Negative for chest pain and palpitations.  Genitourinary:  Positive for dysuria, flank pain, frequency and urgency. Negative for hematuria.    Patient Active Problem List   Diagnosis Date Noted   AVNRT (AV nodal re-entry tachycardia) 09/03/2015   Osteoporosis 01/22/2015    Allergies  Allergen Reactions   Sulfa Antibiotics  Nausea And Vomiting   Zetia [Ezetimibe]     Burning in abdomen   Misc. Sulfonamide Containing Compounds Nausea And Vomiting    Past Surgical History:  Procedure Laterality Date   BREAST BIOPSY Left 08/05/2020   stereo bx, x clip, calcs, path pending.   BUNIONECTOMY Left    CARDIAC ELECTROPHYSIOLOGY MAPPING AND ABLATION     CARDIAC ELECTROPHYSIOLOGY STUDY AND ABLATION     COLONOSCOPY  2011   cleared for 10 yrs- [facility]   VAGINAL HYSTERECTOMY     partial    Social History   Tobacco Use   Smoking status: Former    Current packs/day: 0.00    Average packs/day: 1 pack/day for 25.0 years (25.0 ttl pk-yrs)    Types: Cigarettes    Start date: 08/31/1977    Quit date: 09/01/2002    Years since quitting: 20.1   Smokeless tobacco: Never  Vaping Use   Vaping status: Never Used  Substance Use Topics   Alcohol use: No    Alcohol/week: 0.0 standard drinks of alcohol   Drug use: No     Medication list has been reviewed and updated.  Current Meds  Medication Sig   aspirin 81 MG chewable tablet Chew by mouth.   aspirin EC 81 MG tablet Take 81 mg by mouth daily. Swallow whole.   losartan (COZAAR) 25 MG tablet Take 1 tablet (25 mg total) by mouth daily.   rosuvastatin (CRESTOR)  20 MG tablet Take 1 tablet by mouth daily.   ticagrelor (BRILINTA) 90 MG TABS tablet Take 90 mg by mouth 2 (two) times daily.   valACYclovir (VALTREX) 500 MG tablet Take 1 tablet (500 mg total) by mouth 2 (two) times daily.       10/19/2022    3:03 PM 08/27/2022    2:07 PM 06/01/2022   10:59 AM 10/31/2021    8:48 AM  GAD 7 : Generalized Anxiety Score  Nervous, Anxious, on Edge 0 0 0 0  Control/stop worrying 0 0 0 0  Worry too much - different things 0 0 0 0  Trouble relaxing 0 0 0 0  Restless 0 0 0 0  Easily annoyed or irritable 0 0 0 0  Afraid - awful might happen 0 0 0 0  Total GAD 7 Score 0 0 0 0  Anxiety Difficulty Not difficult at all Not difficult at all Not difficult at all Not difficult at all        10/19/2022    3:03 PM 09/07/2022    3:20 PM 08/27/2022    2:07 PM  Depression screen PHQ 2/9  Decreased Interest 0 1 0  Down, Depressed, Hopeless 0 0 0  PHQ - 2 Score 0 1 0  Altered sleeping 0 0 0  Tired, decreased energy 0 1 0  Change in appetite 0 1 0  Feeling bad or failure about yourself  0 1 0  Trouble concentrating 0 0 0  Moving slowly or fidgety/restless 0 0 0  Suicidal thoughts 0 0 0  PHQ-9 Score 0 4 0  Difficult doing work/chores Not difficult at all Somewhat difficult Not difficult at all    BP Readings from Last 3 Encounters:  10/19/22 128/74  08/27/22 128/82  06/01/22 130/78    Physical Exam Vitals and nursing note reviewed. Exam conducted with a chaperone present.  Constitutional:      General: She is not in acute distress.    Appearance: She is not diaphoretic.  HENT:     Head: Normocephalic and atraumatic.     Right Ear: Tympanic membrane and external ear normal.     Left Ear: Tympanic membrane and external ear normal.     Nose: Nose normal.     Mouth/Throat:     Mouth: Mucous membranes are moist.  Eyes:     General:        Right eye: No discharge.        Left eye: No discharge.     Conjunctiva/sclera: Conjunctivae normal.     Pupils: Pupils are equal, round, and reactive to light.  Neck:     Thyroid: No thyromegaly.     Vascular: No JVD.  Cardiovascular:     Rate and Rhythm: Normal rate and regular rhythm.     Heart sounds: Normal heart sounds. No murmur heard.    No friction rub. No gallop.  Pulmonary:     Effort: Pulmonary effort is normal.     Breath sounds: Normal breath sounds. No wheezing, rhonchi or rales.  Abdominal:     General: Bowel sounds are normal.     Palpations: Abdomen is soft. There is no mass.     Tenderness: There is no abdominal tenderness. There is no guarding.  Musculoskeletal:        General: Normal range of motion.     Cervical back: Normal range of motion and neck supple.  Lymphadenopathy:     Cervical: No  cervical adenopathy.  Skin:    General: Skin is warm and dry.  Neurological:     Mental Status: She is alert.     Deep Tendon Reflexes: Reflexes are normal and symmetric.     Wt Readings from Last 3 Encounters:  10/19/22 132 lb (59.9 kg)  09/07/22 135 lb (61.2 kg)  08/27/22 136 lb (61.7 kg)    BP 128/74   Pulse 66   Ht 5' (1.524 m)   Wt 132 lb (59.9 kg)   SpO2 97%   BMI 25.78 kg/m   Assessment and Plan: 1. Dysuria New onset.  Episodic.  Intermittent and noting.  Patient has recurrent dysuria with urgency frequency and some urinary incontinence.  Dipstick urinalysis notes trace leukocytes with positive nitrates. - POCT urinalysis dipstick  2. Urethritis Patient [place] from the beach and I think this may be a combination of a urethral-itis of a bacterial nature for which we will treat with cephalexin 500 mg 3 times a day for 3 days then on the final day also take a Diflucan 150 mgas 1 dose.  In the event there is a candidiasis concern. - cephALEXin (KEFLEX) 500 MG capsule; Take 1 capsule (500 mg total) by mouth 3 (three) times daily.  Dispense: 9 capsule; Refill: 0 - fluconazole (DIFLUCAN) 150 MG tablet; Take 1 tablet (150 mg total) by mouth once for 1 dose.  Dispense: 1 tablet; Refill: 0     Elizabeth Sauer, MD

## 2022-10-21 ENCOUNTER — Encounter: Payer: Medicare HMO | Admitting: *Deleted

## 2022-10-21 DIAGNOSIS — Z955 Presence of coronary angioplasty implant and graft: Secondary | ICD-10-CM | POA: Diagnosis not present

## 2022-10-21 DIAGNOSIS — Z48812 Encounter for surgical aftercare following surgery on the circulatory system: Secondary | ICD-10-CM | POA: Diagnosis not present

## 2022-10-21 DIAGNOSIS — I252 Old myocardial infarction: Secondary | ICD-10-CM | POA: Diagnosis not present

## 2022-10-21 DIAGNOSIS — I213 ST elevation (STEMI) myocardial infarction of unspecified site: Secondary | ICD-10-CM

## 2022-10-21 NOTE — Progress Notes (Signed)
Daily Session Note  Patient Details  Name: Kirsten Moore MRN: [id number] Date of Birth: 09/01/1955 Referring Provider:   Flowsheet Row Cardiac Rehab from 09/07/2022 in [facility] Cardiac and Pulmonary Rehab  Referring Provider Dr. Elizabeth Sauer, MD       Encounter Date: 10/21/2022  Check In:  Session Check In - 10/21/22 1014       Check-In   Supervising physician immediately available to respond to emergencies See telemetry face sheet for immediately available ER MD    Location [facility]-Cardiac & Pulmonary Rehab    Staff Present Lanny Hurst, RN, ADN;Susanne Bice, RN, BSN, CCRP;Other   Rory Percy, MS   Virtual Visit No    Medication changes reported     No    Fall or balance concerns reported    No    Warm-up and Cool-down Performed on first and last piece of equipment    Resistance Training Performed Yes    VAD Patient? No    PAD/SET Patient? No      Pain Assessment   Currently in Pain? No/denies                Social History   Tobacco Use  Smoking Status Former   Current packs/day: 0.00   Average packs/day: 1 pack/day for 25.0 years (25.0 ttl pk-yrs)   Types: Cigarettes   Start date: 08/31/1977   Quit date: 09/01/2002   Years since quitting: 20.1  Smokeless Tobacco Never    Goals Met:  Independence with exercise equipment Exercise tolerated well No report of concerns or symptoms today Strength training completed today  Goals Unmet:  Not Applicable  Comments: Pt able to follow exercise prescription today without complaint.  Will continue to monitor for progression.    Dr. Bethann Punches is Medical Director for [facility] Cardiac Rehabilitation.  Dr. Vida Rigger is Medical Director for [facility] Pulmonary Rehabilitation.

## 2022-10-26 ENCOUNTER — Encounter: Payer: Medicare HMO | Admitting: *Deleted

## 2022-10-26 DIAGNOSIS — Z955 Presence of coronary angioplasty implant and graft: Secondary | ICD-10-CM | POA: Diagnosis not present

## 2022-10-26 DIAGNOSIS — I213 ST elevation (STEMI) myocardial infarction of unspecified site: Secondary | ICD-10-CM | POA: Diagnosis not present

## 2022-10-26 DIAGNOSIS — I252 Old myocardial infarction: Secondary | ICD-10-CM | POA: Diagnosis not present

## 2022-10-26 DIAGNOSIS — Z48812 Encounter for surgical aftercare following surgery on the circulatory system: Secondary | ICD-10-CM | POA: Diagnosis not present

## 2022-10-26 NOTE — Progress Notes (Signed)
Daily Session Note  Patient Details  Name: Kirsten Moore MRN: [id number] Date of Birth: 01-30-1956 Referring Provider:   Flowsheet Row Cardiac Rehab from 09/07/2022 in [facility] Cardiac and Pulmonary Rehab  Referring Provider Dr. Elizabeth Sauer, MD       Encounter Date: 10/26/2022  Check In:  Session Check In - 10/26/22 0957       Check-In   Supervising physician immediately available to respond to emergencies See telemetry face sheet for immediately available ER MD    Location [facility]-Cardiac & Pulmonary Rehab    Staff Present Lanny Hurst, RN, ADN;Joseph Reino Kent, RCP,RRT,BSRT;Kelly Madilyn Fireman, BS, ACSM CEP, Exercise Physiologist    Virtual Visit No    Medication changes reported     No    Fall or balance concerns reported    No    Warm-up and Cool-down Performed on first and last piece of equipment    Resistance Training Performed Yes    VAD Patient? No    PAD/SET Patient? No      Pain Assessment   Currently in Pain? No/denies                Social History   Tobacco Use  Smoking Status Former   Current packs/day: 0.00   Average packs/day: 1 pack/day for 25.0 years (25.0 ttl pk-yrs)   Types: Cigarettes   Start date: 08/31/1977   Quit date: 09/01/2002   Years since quitting: 20.1  Smokeless Tobacco Never    Goals Met:  Independence with exercise equipment Exercise tolerated well No report of concerns or symptoms today Strength training completed today  Goals Unmet:  Not Applicable  Comments: Pt able to follow exercise prescription today without complaint.  Will continue to monitor for progression.    Dr. Bethann Punches is Medical Director for [facility] Cardiac Rehabilitation.  Dr. Vida Rigger is Medical Director for [facility] Pulmonary Rehabilitation.

## 2022-10-27 ENCOUNTER — Telehealth: Payer: Self-pay | Admitting: Family Medicine

## 2022-10-27 NOTE — Telephone Encounter (Signed)
Copied from CRM [id number]. Topic: General - Inquiry >> Oct 27, 2022  4:20 PM Marlow Baars wrote: Reason for CRM: The patient called in to see if her provider would sign a handicap placard for her car? Please assist patient further

## 2022-10-28 ENCOUNTER — Encounter: Payer: Medicare HMO | Admitting: *Deleted

## 2022-10-28 DIAGNOSIS — Z48812 Encounter for surgical aftercare following surgery on the circulatory system: Secondary | ICD-10-CM | POA: Diagnosis not present

## 2022-10-28 DIAGNOSIS — I213 ST elevation (STEMI) myocardial infarction of unspecified site: Secondary | ICD-10-CM

## 2022-10-28 DIAGNOSIS — Z955 Presence of coronary angioplasty implant and graft: Secondary | ICD-10-CM | POA: Diagnosis not present

## 2022-10-28 DIAGNOSIS — I252 Old myocardial infarction: Secondary | ICD-10-CM | POA: Diagnosis not present

## 2022-10-28 NOTE — Progress Notes (Signed)
Cardiac Individual Treatment Plan  Patient Details  Name: Malaia Colee MRN: [id number] Date of Birth: 07-29-1955 Referring Provider:   Flowsheet Row Cardiac Rehab from 09/07/2022 in [facility] Cardiac and Pulmonary Rehab  Referring Provider Dr. Elizabeth Sauer, MD       Initial Encounter Date:  Flowsheet Row Cardiac Rehab from 09/07/2022 in [facility] Cardiac and Pulmonary Rehab  Date 09/07/22       Visit Diagnosis: ST elevation myocardial infarction (STEMI), unspecified artery (HCC)  Patient's Home Medications on Admission:  Current Outpatient Medications:    aspirin 81 MG chewable tablet, Chew by mouth., Disp: , Rfl:    aspirin EC 81 MG tablet, Take 81 mg by mouth daily. Swallow whole., Disp: , Rfl:    cephALEXin (KEFLEX) 500 MG capsule, Take 1 capsule (500 mg total) by mouth 3 (three) times daily., Disp: 9 capsule, Rfl: 0   losartan (COZAAR) 25 MG tablet, Take 1 tablet (25 mg total) by mouth daily., Disp: 30 tablet, Rfl: 0   rosuvastatin (CRESTOR) 20 MG tablet, Take 1 tablet by mouth daily., Disp: , Rfl:    ticagrelor (BRILINTA) 90 MG TABS tablet, Take 90 mg by mouth 2 (two) times daily., Disp: , Rfl:    valACYclovir (VALTREX) 500 MG tablet, Take 1 tablet (500 mg total) by mouth 2 (two) times daily., Disp: 30 tablet, Rfl: 6   valACYclovir (VALTREX) 500 MG tablet, Take by mouth. (Patient not taking: Reported on 09/02/2022), Disp: , Rfl:   Past Medical History: Past Medical History:  Diagnosis Date   Hypertension     Tobacco Use: Social History   Tobacco Use  Smoking Status Former   Current packs/day: 0.00   Average packs/day: 1 pack/day for 25.0 years (25.0 ttl pk-yrs)   Types: Cigarettes   Start date: 08/31/1977   Quit date: 09/01/2002   Years since quitting: 20.1  Smokeless Tobacco Never    Labs: Review Flowsheet  More data exists      Latest Ref Rng & Units 04/04/2018 04/19/2019 06/12/2020 08/13/2021 06/01/2022  Labs for ITP Cardiac and Pulmonary Rehab  Cholestrol 100 - 199  mg/dL 696  295  284  132  440   LDL (calc) 0 - 99 mg/dL 102  725  366  440  347   HDL-C >39 mg/dL 78  82  73  77  72   Trlycerides 0 - 149 mg/dL 425  956  387  564  332     Details             Exercise Target Goals: Exercise Program Goal: Individual exercise prescription set using results from initial 6 min walk test and THRR while considering  patient's activity barriers and safety.   Exercise Prescription Goal: Initial exercise prescription builds to 30-45 minutes a day of aerobic activity, 2-3 days per week.  Home exercise guidelines will be given to patient during program as part of exercise prescription that the participant will acknowledge.   Education: Aerobic Exercise: - Group verbal and visual presentation on the components of exercise prescription. Introduces F.I.T.T principle from ACSM for exercise prescriptions.  Reviews F.I.T.T. principles of aerobic exercise including progression. Written material given at graduation.   Education: Resistance Exercise: - Group verbal and visual presentation on the components of exercise prescription. Introduces F.I.T.T principle from ACSM for exercise prescriptions  Reviews F.I.T.T. principles of resistance exercise including progression. Written material given at graduation.    Education: Exercise & Equipment Safety: - Individual verbal instruction and demonstration of equipment  use and safety with use of the equipment. Flowsheet Row Cardiac Rehab from 09/07/2022 in [facility] Cardiac and Pulmonary Rehab  Date 09/02/22  Educator [facility]  Instruction Review Code 1- Verbalizes Understanding       Education: Exercise Physiology & General Exercise Guidelines: - Group verbal and written instruction with models to review the exercise physiology of the cardiovascular system and associated critical values. Provides general exercise guidelines with specific guidelines to those with heart or lung disease.    Education: Flexibility, Balance,  Mind/Body Relaxation: - Group verbal and visual presentation with interactive activity on the components of exercise prescription. Introduces F.I.T.T principle from ACSM for exercise prescriptions. Reviews F.I.T.T. principles of flexibility and balance exercise training including progression. Also discusses the mind body connection.  Reviews various relaxation techniques to help reduce and manage stress (i.e. Deep breathing, progressive muscle relaxation, and visualization). Balance handout provided to take home. Written material given at graduation.   Activity Barriers & Risk Stratification:  Activity Barriers & Cardiac Risk Stratification - 09/07/22 1535       Activity Barriers & Cardiac Risk Stratification   Activity Barriers Other (comment)    Comments Occasional hip and knee pain    Cardiac Risk Stratification Moderate             6 Minute Walk:  6 Minute Walk     Row Name 09/07/22 1534         6 Minute Walk   Phase Initial     Distance 1205 feet     Walk Time 6 minutes     # of Rest Breaks 0     MPH 2.28     METS 2.73     RPE 11     Perceived Dyspnea  0     VO2 Peak 9.57     Symptoms No     Resting HR 65 bpm     Resting BP 118/68     Resting Oxygen Saturation  98 %     Exercise Oxygen Saturation  during 6 min walk 94 %     Max Ex. HR 90 bpm     Max Ex. BP 138/80     2 Minute Post BP 132/76              Oxygen Initial Assessment:   Oxygen Re-Evaluation:   Oxygen Discharge (Final Oxygen Re-Evaluation):   Initial Exercise Prescription:  Initial Exercise Prescription - 09/07/22 1500       Date of Initial Exercise RX and Referring Provider   Date 09/07/22    Referring Provider Dr. Elizabeth Sauer, MD      Oxygen   Maintain Oxygen Saturation 88% or higher      Treadmill   MPH 2    Grade 0.5    Minutes 15    METs 2.67      Recumbant Bike   Level 2    RPM 50    Watts 14    Minutes 15    METs 2.73      NuStep   Level 2    SPM 80     Minutes 15    METs 2.73      Track   Laps 32    Minutes 15    METs 2.74      Prescription Details   Frequency (times per week) 2    Duration Progress to 30 minutes of continuous aerobic without signs/symptoms of physical distress      Intensity  THRR 40-80% of Max Heartrate 99-134    Ratings of Perceived Exertion 11-13    Perceived Dyspnea 0-4      Progression   Progression Continue to progress workloads to maintain intensity without signs/symptoms of physical distress.      Resistance Training   Training Prescription Yes    Weight 2 lb    Reps 10-15             Perform Capillary Blood Glucose checks as needed.  Exercise Prescription Changes:   Exercise Prescription Changes     Row Name 09/07/22 1500 09/24/22 1400 10/22/22 0900         Response to Exercise   Blood Pressure (Admit) 118/68 110/62 108/68     Blood Pressure (Exercise) 138/80 132/90 138/68     Blood Pressure (Exit) 132/76 104/62 96/70     Heart Rate (Admit) 65 bpm 62 bpm 94 bpm     Heart Rate (Exercise) 90 bpm 105 bpm 116 bpm     Heart Rate (Exit) 60 bpm 70 bpm 88 bpm     Oxygen Saturation (Admit) 98 % -- --     Oxygen Saturation (Exercise) 94 % -- --     Rating of Perceived Exertion (Exercise) 11 11 13      Perceived Dyspnea (Exercise) 0 -- --     Symptoms none none --     Comments Results First two days of exercise --     Duration -- Continue with 30 min of aerobic exercise without signs/symptoms of physical distress. --     Intensity -- THRR unchanged --       Progression   Progression -- Continue to progress workloads to maintain intensity without signs/symptoms of physical distress. Continue to progress workloads to maintain intensity without signs/symptoms of physical distress.     Average METs -- 2.3 2.8       Resistance Training   Training Prescription -- Yes Yes     Weight -- 2 lb 2 lb     Reps -- 10-15 10-15       Interval Training   Interval Training -- No No        Treadmill   MPH -- 2 2     Grade -- 0.5 1     Minutes -- 15 15     METs -- 2.67 2.83       Recumbant Bike   Level -- -- 2     RPM -- -- 16     Watts -- -- 16     Minutes -- -- 15     METs -- -- 2.81       NuStep   Level -- 2 --     Minutes -- 15 --     METs -- 2.1 --       Oxygen   Maintain Oxygen Saturation -- 88% or higher 88% or higher              Exercise Comments:   Exercise Comments     Row Name 09/16/22 1110           Exercise Comments First full day of exercise!  Patient was oriented to gym and equipment including functions, settings, policies, and procedures.  Patient's individual exercise prescription and treatment plan were reviewed.  All starting workloads were established based on the results of the 6 minute walk test done at initial orientation visit.  The plan for exercise progression was also introduced and progression will  be customized based on patient's performance and goals.                Exercise Goals and Review:   Exercise Goals     Row Name 09/07/22 1538             Exercise Goals   Increase Physical Activity Yes       Intervention Provide advice, education, support and counseling about physical activity/exercise needs.;Develop an individualized exercise prescription for aerobic and resistive training based on initial evaluation findings, risk stratification, comorbidities and participant's personal goals.       Expected Outcomes Long Term: Add in home exercise to make exercise part of routine and to increase amount of physical activity.;Long Term: Exercising regularly at least 3-5 days a week.;Short Term: Attend rehab on a regular basis to increase amount of physical activity.       Increase Strength and Stamina Yes       Intervention Provide advice, education, support and counseling about physical activity/exercise needs.;Develop an individualized exercise prescription for aerobic and resistive training based on initial evaluation  findings, risk stratification, comorbidities and participant's personal goals.       Expected Outcomes Short Term: Increase workloads from initial exercise prescription for resistance, speed, and METs.;Short Term: Perform resistance training exercises routinely during rehab and add in resistance training at home;Long Term: Improve cardiorespiratory fitness, muscular endurance and strength as measured by increased METs and functional capacity ( )       Able to understand and use rate of perceived exertion (RPE) scale Yes       Intervention Provide education and explanation on how to use RPE scale       Expected Outcomes Short Term: Able to use RPE daily in rehab to express subjective intensity level;Long Term:  Able to use RPE to guide intensity level when exercising independently       Able to understand and use Dyspnea scale Yes       Intervention Provide education and explanation on how to use Dyspnea scale       Expected Outcomes Short Term: Able to use Dyspnea scale daily in rehab to express subjective sense of shortness of breath during exertion;Long Term: Able to use Dyspnea scale to guide intensity level when exercising independently       Knowledge and understanding of Target Heart Rate Range (THRR) Yes       Intervention Provide education and explanation of THRR including how the numbers were predicted and where they are located for reference       Expected Outcomes Short Term: Able to state/look up THRR;Long Term: Able to use THRR to govern intensity when exercising independently;Short Term: Able to use daily as guideline for intensity in rehab       Able to check pulse independently Yes       Intervention Provide education and demonstration on how to check pulse in carotid and radial arteries.;Review the importance of being able to check your own pulse for safety during independent exercise       Expected Outcomes Short Term: Able to explain why pulse checking is important during  independent exercise;Long Term: Able to check pulse independently and accurately       Understanding of Exercise Prescription Yes       Intervention Provide education, explanation, and written materials on patient's individual exercise prescription       Expected Outcomes Short Term: Able to explain program exercise prescription;Long Term: Able to explain home exercise prescription  to exercise independently                Exercise Goals Re-Evaluation :  Exercise Goals Re-Evaluation     Row Name 09/16/22 1110 09/24/22 1431 10/22/22 0952         Exercise Goal Re-Evaluation   Exercise Goals Review Able to understand and use rate of perceived exertion (RPE) scale;Able to understand and use Dyspnea scale;Knowledge and understanding of Target Heart Rate Range (THRR);Understanding of Exercise Prescription Understanding of Exercise Prescription;Increase Physical Activity;Increase Strength and Stamina Increase Physical Activity;Increase Strength and Stamina;Understanding of Exercise Prescription     Comments Reviewed RPE  and dyspnea scale, THR and program prescription with pt today.  Pt voiced understanding and was given a copy of goals to take home. [place]ia is off to a good start in the program. During her first two sessions of rehab she walked the treadmill at a speed of 2 mph with a o.5% incline. She also worked on the T4 nustep at level 2 and did well with 2 lb hand weights for resistance training. We will continue to monitor her progress. Trenny has increased workload on the Recumbent bike and the treadmill.  She attended one session during this review time period. He bike wen t from level 1 to level 2. she added 0.5% more incline to the treadmill  2/1%. Her RPE was at 13 and she continued to use 2 lb hand weights.     Expected Outcomes Short: Use RPE daily to regulate intensity. Long: Follow program prescription in THR. Short: Continue to follow initial exercise prescription. Long: Continue to follow  program prescription in THR. WUJ:[id number] at least one workload as tolerated with goal of increased stamina and strength.   LTG: Continued exercise progression as tolerated during program and after discharge.              Discharge Exercise Prescription (Final Exercise Prescription Changes):  Exercise Prescription Changes - 10/22/22 0900       Response to Exercise   Blood Pressure (Admit) 108/68    Blood Pressure (Exercise) 138/68    Blood Pressure (Exit) 96/70    Heart Rate (Admit) 94 bpm    Heart Rate (Exercise) 116 bpm    Heart Rate (Exit) 88 bpm    Rating of Perceived Exertion (Exercise) 13      Progression   Progression Continue to progress workloads to maintain intensity without signs/symptoms of physical distress.    Average METs 2.8      Resistance Training   Training Prescription Yes    Weight 2 lb    Reps 10-15      Interval Training   Interval Training No      Treadmill   MPH 2    Grade 1    Minutes 15    METs 2.83      Recumbant Bike   Level 2    RPM 16    Watts 16    Minutes 15    METs 2.81      Oxygen   Maintain Oxygen Saturation 88% or higher             Nutrition:  Target Goals: Understanding of nutrition guidelines, daily intake of sodium 1500mg , cholesterol 200mg , calories 30% from fat and 7% or less from saturated fats, daily to have 5 or more servings of fruits and vegetables.  Education: All About Nutrition: -Group instruction provided by verbal, written material, interactive activities, discussions, models, and posters to present general guidelines  for heart healthy nutrition including fat, fiber, MyPlate, the role of sodium in heart healthy nutrition, utilization of the nutrition label, and utilization of this knowledge for meal planning. Follow up email sent as well. Written material given at graduation. Flowsheet Row Cardiac Rehab from 09/07/2022 in [facility] Cardiac and Pulmonary Rehab  Education need identified 09/07/22        Biometrics:  Pre Biometrics - 09/07/22 1539       Pre Biometrics   Height 5' 0.5" (1.537 m)    Weight 135 lb (61.2 kg)    Waist Circumference 32.5 inches    Hip Circumference 39 inches    Waist to Hip Ratio 0.83 %    BMI (Calculated) 25.92    Single Leg Stand 12.7 seconds              Nutrition Therapy Plan and Nutrition Goals:  Nutrition Therapy & Goals - 09/07/22 1521       Intervention Plan   Intervention Prescribe, educate and counsel regarding individualized specific dietary modifications aiming towards targeted core components such as weight, hypertension, lipid management, diabetes, heart failure and other comorbidities.    Expected Outcomes Short Term Goal: Understand basic principles of dietary content, such as calories, fat, sodium, cholesterol and nutrients.;Short Term Goal: A plan has been developed with personal nutrition goals set during dietitian appointment.;Long Term Goal: Adherence to prescribed nutrition plan.             Nutrition Assessments:  MEDIFICTS Score Key: ?70 Need to make dietary changes  40-70 Heart Healthy Diet ? 40 Therapeutic Level Cholesterol Diet  Flowsheet Row Cardiac Rehab from 09/07/2022 in [facility] Cardiac and Pulmonary Rehab  Picture Your Plate Total Score on Admission 53      Picture Your Plate Scores: <81 Unhealthy dietary pattern with much room for improvement. 41-50 Dietary pattern unlikely to meet recommendations for good health and room for improvement. 51-60 More healthful dietary pattern, with some room for improvement.  >60 Healthy dietary pattern, although there may be some specific behaviors that could be improved.    Nutrition Goals Re-Evaluation:   Nutrition Goals Discharge (Final Nutrition Goals Re-Evaluation):   Psychosocial: Target Goals: Acknowledge presence or absence of significant depression and/or stress, maximize coping skills, provide positive support system. Participant is able to verbalize  types and ability to use techniques and skills needed for reducing stress and depression.   Education: Stress, Anxiety, and Depression - Group verbal and visual presentation to define topics covered.  Reviews how body is impacted by stress, anxiety, and depression.  Also discusses healthy ways to reduce stress and to treat/manage anxiety and depression.  Written material given at graduation.   Education: Sleep Hygiene -Provides group verbal and written instruction about how sleep can affect your health.  Define sleep hygiene, discuss sleep cycles and impact of sleep habits. Review good sleep hygiene tips.    Initial Review & Psychosocial Screening:  Initial Psych Review & Screening - 09/02/22 1428       Initial Review   Current issues with None Identified      Family Dynamics   Good Support System? Yes    Comments She can look to her daughter son and best friend. Her family is near byto support her. Her husband died two years ago.      Barriers   Psychosocial barriers to participate in program The patient should benefit from training in stress management and relaxation.;There are no identifiable barriers or psychosocial needs.  Screening Interventions   Interventions To provide support and resources with identified psychosocial needs;Provide feedback about the scores to participant;Encouraged to exercise    Expected Outcomes Short Term goal: Utilizing psychosocial counselor, staff and physician to assist with identification of specific Stressors or current issues interfering with healing process. Setting desired goal for each stressor or current issue identified.;Long Term Goal: Stressors or current issues are controlled or eliminated.;Short Term goal: Identification and review with participant of any Quality of Life or Depression concerns found by scoring the questionnaire.;Long Term goal: The participant improves quality of Life and PHQ9 Scores as seen by post scores and/or  verbalization of changes             Quality of Life Scores:   Quality of Life - 09/07/22 1522       Quality of Life   Select Quality of Life      Quality of Life Scores   Health/Function Pre 24.67 %    Socioeconomic Pre 27.5 %    Psych/Spiritual Pre 24.64 %    Family Pre 22.8 %    GLOBAL Pre 24.97 %            Scores of 19 and below usually indicate a poorer quality of life in these areas.  A difference of  2-3 points is a clinically meaningful difference.  A difference of 2-3 points in the total score of the Quality of Life Index has been associated with significant improvement in overall quality of life, self-image, physical symptoms, and general health in studies assessing change in quality of life.  PHQ-9: Review Flowsheet  More data exists      10/19/2022 09/07/2022 08/27/2022 08/12/2022 06/01/2022  Depression screen PHQ 2/9  Decreased Interest 0 1 0 0 0  Down, Depressed, Hopeless 0 0 0 0 0  PHQ - 2 Score 0 1 0 0 0  Altered sleeping 0 0 0 0 0  Tired, decreased energy 0 1 0 0 0  Change in appetite 0 1 0 0 0  Feeling bad or failure about yourself  0 1 0 0 0  Trouble concentrating 0 0 0 0 0  Moving slowly or fidgety/restless 0 0 0 0 0  Suicidal thoughts 0 0 0 0 0  PHQ-9 Score 0 4 0 0 0  Difficult doing work/chores Not difficult at all Somewhat difficult Not difficult at all Not difficult at all Not difficult at all    Details           Interpretation of Total Score  Total Score Depression Severity:  1-4 = Minimal depression, 5-9 = Mild depression, 10-14 = Moderate depression, 15-19 = Moderately severe depression, 20-27 = Severe depression   Psychosocial Evaluation and Intervention:  Psychosocial Evaluation - 09/02/22 1430       Psychosocial Evaluation & Interventions   Interventions Encouraged to exercise with the program and follow exercise prescription;Relaxation education;Stress management education    Comments She can look to her daughter son and best  friend. Her family is near byto support her. Her husband died two years ago. 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    Expected Outcomes Short: Start HeartTrack to help with mood. Long: Maintain a healthy mental state    Continue Psychosocial Services  Follow up required by staff             Psychosocial Re-Evaluation:   Psychosocial Discharge (Final Psychosocial Re-Evaluation):   Vocational Rehabilitation: Provide vocational rehab  assistance to qualifying candidates.   Vocational Rehab Evaluation & Intervention:   Education: Education Goals: Education classes will be provided on a variety of topics geared toward better understanding of heart health and risk factor modification. Participant will state understanding/return demonstration of topics presented as noted by education test scores.  Learning Barriers/Preferences:  Learning Barriers/Preferences - 09/02/22 1426       Learning Barriers/Preferences   Learning Barriers None    Learning Preferences None             General Cardiac Education Topics:  AED/CPR: - Group verbal and written instruction with the use of models to demonstrate the basic use of the AED with the basic ABC's of resuscitation.   Anatomy and Cardiac Procedures: - Group verbal and visual presentation and models provide information about basic cardiac anatomy and function. Reviews the testing methods done to diagnose heart disease and the outcomes of the test results. Describes the treatment choices: Medical Management, Angioplasty, or Coronary Bypass Surgery for treating various heart conditions including Myocardial Infarction, Angina, Valve Disease, and Cardiac Arrhythmias.  Written material given at graduation.   Medication Safety: - Group verbal and visual instruction to review commonly prescribed medications for heart and lung disease. Reviews the medication, class of the drug, and side effects. Includes the steps to  properly store meds and maintain the prescription regimen.  Written material given at graduation.   Intimacy: - Group verbal instruction through game format to discuss how heart and lung disease can affect sexual intimacy. Written material given at graduation..   Know Your Numbers and Heart Failure: - Group verbal and visual instruction to discuss disease risk factors for cardiac and pulmonary disease and treatment options.  Reviews associated critical values for Overweight/Obesity, Hypertension, Cholesterol, and Diabetes.  Discusses basics of heart failure: signs/symptoms and treatments.  Introduces Heart Failure Zone chart for action plan for heart failure.  Written material given at graduation.   Infection Prevention: - Provides verbal and written material to individual with discussion of infection control including proper hand washing and proper equipment cleaning during exercise session. Flowsheet Row Cardiac Rehab from 09/07/2022 in [facility] Cardiac and Pulmonary Rehab  Date 09/02/22  Educator [facility] [facility]  Instruction Review Code 1- Verbalizes Understanding       Falls Prevention: - Provides verbal and written material to individual with discussion of falls prevention and safety. Flowsheet Row Cardiac Rehab from 09/07/2022 in [facility] Cardiac and Pul[facility] Rehab  Date 09/02/22  Educator [facility]  Instruction Review Code 1- Verbalizes Understanding       Other: -Provides group and verbal instruction on various topics (see comments)   Knowledge Questionnaire Score:  Knowledge Questionnaire Score - 09/07/22 1520       Knowledge Questionnaire Score   Pre Score 25/26             Core Components/Risk Factors/Patient Goals at Admission:  Personal Goals and Risk Factors at Admission - 09/02/22 1426       Core Components/Risk Factors/Patient Goals on Admission    Weight Management Yes;Weight Loss    Intervention Weight Management: Develop a combined nutrition and exercise program designed  to reach desired caloric intake, while maintaining appropriate intake of nutrient and fiber, sodium and fats, and appropriate energy expenditure required for the weight goal.;Weight Management: Provide education and appropriate resources to help participant work on and attain dietary goals.;Weight Management/Obesity: Establish reasonable short term and long term weight goals.    Expected Outcomes Short Term: Continue to assess and modify  interventions until short term weight is achieved;Long Term: Adherence to nutrition and physical activity/exercise program aimed toward attainment of established weight goal;Weight Loss: Understanding of general recommendations for a balanced deficit meal plan, which promotes 1-2 lb weight loss per week and includes a negative energy balance of [phone] kcal/d;Understanding recommendations for meals to include 15-35% energy as protein, 25-35% energy from fat, 35-60% energy from carbohydrates, less than 200mg  of dietary cholesterol, 20-35 gm of total fiber daily;Understanding of distribution of calorie intake throughout the day with the consumption of 4-5 meals/snacks    Hypertension Yes    Intervention Provide education on lifestyle modifcations including regular physical activity/exercise, weight management, moderate sodium restriction and increased consumption of fresh fruit, vegetables, and low fat dairy, alcohol moderation, and smoking cessation.;Monitor prescription use compliance.    Expected Outcomes Short Term: Continued assessment and intervention until BP is < 140/61mm HG in hypertensive participants. < 130/27mm HG in hypertensive participants with diabetes, heart failure or chronic kidney disease.;Long Term: Maintenance of blood pressure at goal levels.    Lipids Yes    Intervention Provide education and support for participant on nutrition & aerobic/resistive exercise along with prescribed medications to achieve LDL 70mg , HDL >40mg .    Expected Outcomes Long  Term: Cholesterol controlled with medications as prescribed, with individualized exercise RX and with personalized nutrition plan. Value goals: LDL < 70mg , HDL > 40 mg.;Short Term: Participant states understanding of desired cholesterol values and is compliant with medications prescribed. Participant is following exercise prescription and nutrition guidelines.             Education:Diabetes - Individual verbal and written instruction to review signs/symptoms of diabetes, desired ranges of glucose level fasting, after meals and with exercise. Acknowledge that pre and post exercise glucose checks will be done for 3 sessions at entry of program.   Core Components/Risk Factors/Patient Goals Review:    Core Components/Risk Factors/Patient Goals at Discharge (Final Review):    ITP Comments:  ITP Comments     Row Name 09/02/22 1425 09/07/22 1517 09/09/22 1125 09/16/22 1110 10/06/22 1459   ITP Comments Virtual Visit completed. Patient informed on EP and RD appointment and 6 Minute walk test. Patient also informed of patient health questionnaires on My Chart. Patient Verbalizes understanding. Visit diagnosis can be found in [facility]/16/[facility]. Completed and gym orientation. Initial ITP created and sent for review to Dr. Bethann Punches, Medical Director. 30 Day review completed. Medical Director ITP review done, changes made as directed, and signed approval by Medical Director.   new to program First full day of exercise!  Patient was oriented to gym and equipment including functions, settings, policies, and procedures.  Patient's individual exercise prescription and treatment plan were reviewed.  All starting workloads were established based on the results of the 6 minute walk test done at initial orientation visit.  The plan for exercise progression was also introduced and progression will be customized based on patient's performance and goals. 30 Day review completed. Medical Director ITP review done,  changes made as directed, and signed approval by Medical Director.   new to program    Row Name 10/28/22 1002           ITP Comments Calin graduated today from  rehab with 10 sessions completed.  Details of the patient's exercise prescription and what She needs to do in order to continue the prescription and progress were discussed with patient.  Patient was given a copy of prescription and goals.  Patient verbalized  understanding.  Alisen plans to continue to exercise by walking and using her row machine at home.                Comments: Discharge ITP

## 2022-10-28 NOTE — Progress Notes (Signed)
Discharge Summary: Kirsten Moore (DOB [birth year])  [name] graduated today from  rehab with 10 sessions completed.  Details of the patient's exercise prescription and what She needs to do in order to continue the prescription and progress were discussed with patient.  Patient was given a copy of prescription and goals.  Patient verbalized understanding.  [name] plans to continue to exercise by walking and using her row machine at home.   6 Minute Walk     Row Name 09/07/22 1534         6 Minute Walk   Phase Initial     Distance 1205 feet     Walk Time 6 minutes     # of Rest Breaks 0     MPH 2.28     METS 2.73     RPE 11     Perceived Dyspnea  0     VO2 Peak 9.57     Symptoms No     Resting HR 65 bpm     Resting BP 118/68     Resting Oxygen Saturation  98 %     Exercise Oxygen Saturation  during 6 min walk 94 %     Max Ex. HR 90 bpm     Max Ex. BP 138/80     2 Minute Post BP 132/76

## 2022-10-28 NOTE — Progress Notes (Signed)
Daily Session Note  Patient Details  Name: Kirsten Moore MRN: [id number] Date of Birth: April 09, 1955 Referring Provider:   Flowsheet Row Cardiac Rehab from 09/07/2022 in [facility] Cardiac and Pulmonary Rehab  Referring Provider Dr. Elizabeth Sauer, MD       Encounter Date: 10/28/2022  Check In:  Session Check In - 10/28/22 0957       Check-In   Supervising physician immediately available to respond to emergencies See telemetry face sheet for immediately available ER MD    Location [facility]-Cardiac & Pulmonary Rehab    Staff Present Kirsten Hurst, RN, ADN;Kirsten Moore, RCP,RRT,BSRT;Other   Kirsten Rm, MS   Virtual Visit No    Medication changes reported     No    Fall or balance concerns reported    No    Warm-up and Cool-down Performed on first and last piece of equipment    Resistance Training Performed Yes    VAD Patient? No    PAD/SET Patient? No      Pain Assessment   Currently in Pain? No/denies                Social History   Tobacco Use  Smoking Status Former   Current packs/day: 0.00   Average packs/day: 1 pack/day for 25.0 years (25.0 ttl pk-yrs)   Types: Cigarettes   Start date: 08/31/1977   Quit date: 09/01/2002   Years since quitting: 20.1  Smokeless Tobacco Never    Goals Met:  Independence with exercise equipment Exercise tolerated well No report of concerns or symptoms today Strength training completed today  Goals Unmet:  Not Applicable  Comments:  Kirsten Moore graduated today from  rehab with 10 sessions completed.  Details of the patient's exercise prescription and what She needs to do in order to continue the prescription and progress were discussed with patient.  Patient was given a copy of prescription and goals.  Patient verbalized understanding.  Kirsten Moore plans to continue to exercise by walking and using her row machine at home.    Kirsten Moore is Medical Director for [facility] Cardiac Rehabilitation.  Kirsten Moore is Medical Director for  [facility] Pulmonary Rehabilitation.

## 2022-11-02 DIAGNOSIS — Z955 Presence of coronary angioplasty implant and graft: Secondary | ICD-10-CM | POA: Diagnosis not present

## 2022-11-02 DIAGNOSIS — E785 Hyperlipidemia, unspecified: Secondary | ICD-10-CM | POA: Diagnosis not present

## 2022-11-02 DIAGNOSIS — I2111 ST elevation (STEMI) myocardial infarction involving right coronary artery: Secondary | ICD-10-CM | POA: Diagnosis not present

## 2022-11-02 DIAGNOSIS — I471 Supraventricular tachycardia, unspecified: Secondary | ICD-10-CM | POA: Insufficient documentation

## 2022-11-02 DIAGNOSIS — I10 Essential (primary) hypertension: Secondary | ICD-10-CM | POA: Diagnosis not present

## 2022-11-02 DIAGNOSIS — R0609 Other forms of dyspnea: Secondary | ICD-10-CM | POA: Diagnosis not present

## 2022-11-02 DIAGNOSIS — R42 Dizziness and giddiness: Secondary | ICD-10-CM | POA: Diagnosis not present

## 2022-11-03 ENCOUNTER — Encounter: Payer: Self-pay | Admitting: *Deleted

## 2022-11-03 DIAGNOSIS — I213 ST elevation (STEMI) myocardial infarction of unspecified site: Secondary | ICD-10-CM

## 2022-11-03 NOTE — Progress Notes (Signed)
Cardiac Individual Treatment Plan  Patient Details  Name: Kirsten Moore MRN: [id number] Date of Birth: April 19, 1955 Referring Provider:   Flowsheet Row Cardiac Rehab from 09/07/2022 in [facility] Cardiac and Pulmonary Rehab  Referring Provider Dr. Elizabeth Sauer, MD       Initial Encounter Date:  Flowsheet Row Cardiac Rehab from 09/07/2022 in [facility] Cardiac and Pulmonary Rehab  Date 09/07/22       Visit Diagnosis: ST elevation myocardial infarction (STEMI), unspecified artery (HCC)  Patient's Home Medications on Admission:  Current Outpatient Medications:    aspirin 81 MG chewable tablet, Chew by mouth., Disp: , Rfl:    aspirin EC 81 MG tablet, Take 81 mg by mouth daily. Swallow whole., Disp: , Rfl:    cephALEXin (KEFLEX) 500 MG capsule, Take 1 capsule (500 mg total) by mouth 3 (three) times daily., Disp: 9 capsule, Rfl: 0   losartan (COZAAR) 25 MG tablet, Take 1 tablet (25 mg total) by mouth daily., Disp: 30 tablet, Rfl: 0   rosuvastatin (CRESTOR) 20 MG tablet, Take 1 tablet by mouth daily., Disp: , Rfl:    ticagrelor (BRILINTA) 90 MG TABS tablet, Take 90 mg by mouth 2 (two) times daily., Disp: , Rfl:    valACYclovir (VALTREX) 500 MG tablet, Take 1 tablet (500 mg total) by mouth 2 (two) times daily., Disp: 30 tablet, Rfl: 6   valACYclovir (VALTREX) 500 MG tablet, Take by mouth. (Patient not taking: Reported on 09/02/2022), Disp: , Rfl:   Past Medical History: Past Medical History:  Diagnosis Date   Hypertension     Tobacco Use: Social History   Tobacco Use  Smoking Status Former   Current packs/day: 0.00   Average packs/day: 1 pack/day for 25.0 years (25.0 ttl pk-yrs)   Types: Cigarettes   Start date: 08/31/1977   Quit date: 09/01/2002   Years since quitting: 20.1  Smokeless Tobacco Never    Labs: Review Flowsheet  More data exists      Latest Ref Rng & Units 04/04/2018 04/19/2019 06/12/2020 08/13/2021 06/01/2022  Labs for ITP Cardiac and Pulmonary Rehab  Cholestrol 100 - 199  mg/dL 244  010  272  536  644   LDL (calc) 0 - 99 mg/dL 034  742  595  638  756   HDL-C >39 mg/dL 78  82  73  77  72   Trlycerides 0 - 149 mg/dL 433  295  188  416  606     Details             Exercise Target Goals: Exercise Program Goal: Individual exercise prescription set using results from initial 6 min walk test and THRR while considering  patient's activity barriers and safety.   Exercise Prescription Goal: Initial exercise prescription builds to 30-45 minutes a day of aerobic activity, 2-3 days per week.  Home exercise guidelines will be given to patient during program as part of exercise prescription that the participant will acknowledge.   Education: Aerobic Exercise: - Group verbal and visual presentation on the components of exercise prescription. Introduces F.I.T.T principle from ACSM for exercise prescriptions.  Reviews F.I.T.T. principles of aerobic exercise including progression. Written material given at graduation.   Education: Resistance Exercise: - Group verbal and visual presentation on the components of exercise prescription. Introduces F.I.T.T principle from ACSM for exercise prescriptions  Reviews F.I.T.T. principles of resistance exercise including progression. Written material given at graduation.    Education: Exercise & Equipment Safety: - Individual verbal instruction and demonstration of equipment  use and safety with use of the equipment. Flowsheet Row Cardiac Rehab from 09/07/2022 in [facility] Cardiac and Pulmonary Rehab  Date 09/02/22  Educator [facility]  Instruction Review Code 1- Verbalizes Understanding       Education: Exercise Physiology & General Exercise Guidelines: - Group verbal and written instruction with models to review the exercise physiology of the cardiovascular system and associated critical values. Provides general exercise guidelines with specific guidelines to those with heart or lung disease.    Education: Flexibility, Balance,  Mind/Body Relaxation: - Group verbal and visual presentation with interactive activity on the components of exercise prescription. Introduces F.I.T.T principle from ACSM for exercise prescriptions. Reviews F.I.T.T. principles of flexibility and balance exercise training including progression. Also discusses the mind body connection.  Reviews various relaxation techniques to help reduce and manage stress (i.e. Deep breathing, progressive muscle relaxation, and visualization). Balance handout provided to take home. Written material given at graduation.   Activity Barriers & Risk Stratification:  Activity Barriers & Cardiac Risk Stratification - 09/07/22 1535       Activity Barriers & Cardiac Risk Stratification   Activity Barriers Other (comment)    Comments Occasional hip and knee pain    Cardiac Risk Stratification Moderate             6 Minute Walk:  6 Minute Walk     Row Name 09/07/22 1534         6 Minute Walk   Phase Initial     Distance 1205 feet     Walk Time 6 minutes     # of Rest Breaks 0     MPH 2.28     METS 2.73     RPE 11     Perceived Dyspnea  0     VO2 Peak 9.57     Symptoms No     Resting HR 65 bpm     Resting BP 118/68     Resting Oxygen Saturation  98 %     Exercise Oxygen Saturation  during 6 min walk 94 %     Max Ex. HR 90 bpm     Max Ex. BP 138/80     2 Minute Post BP 132/76              Oxygen Initial Assessment:   Oxygen Re-Evaluation:   Oxygen Discharge (Final Oxygen Re-Evaluation):   Initial Exercise Prescription:  Initial Exercise Prescription - 09/07/22 1500       Date of Initial Exercise RX and Referring Provider   Date 09/07/22    Referring Provider Dr. Elizabeth Sauer, MD      Oxygen   Maintain Oxygen Saturation 88% or higher      Treadmill   MPH 2    Grade 0.5    Minutes 15    METs 2.67      Recumbant Bike   Level 2    RPM 50    Watts 14    Minutes 15    METs 2.73      NuStep   Level 2    SPM 80     Minutes 15    METs 2.73      Track   Laps 32    Minutes 15    METs 2.74      Prescription Details   Frequency (times per week) 2    Duration Progress to 30 minutes of continuous aerobic without signs/symptoms of physical distress      Intensity  THRR 40-80% of Max Heartrate 99-134    Ratings of Perceived Exertion 11-13    Perceived Dyspnea 0-4      Progression   Progression Continue to progress workloads to maintain intensity without signs/symptoms of physical distress.      Resistance Training   Training Prescription Yes    Weight 2 lb    Reps 10-15             Perform Capillary Blood Glucose checks as needed.  Exercise Prescription Changes:   Exercise Prescription Changes     Row Name 09/07/22 1500 09/24/22 1400 10/22/22 0900         Response to Exercise   Blood Pressure (Admit) 118/68 110/62 108/68     Blood Pressure (Exercise) 138/80 132/90 138/68     Blood Pressure (Exit) 132/76 104/62 96/70     Heart Rate (Admit) 65 bpm 62 bpm 94 bpm     Heart Rate (Exercise) 90 bpm 105 bpm 116 bpm     Heart Rate (Exit) 60 bpm 70 bpm 88 bpm     Oxygen Saturation (Admit) 98 % -- --     Oxygen Saturation (Exercise) 94 % -- --     Rating of Perceived Exertion (Exercise) 11 11 13      Perceived Dyspnea (Exercise) 0 -- --     Symptoms none none --     Comments Results First two days of exercise --     Duration -- Continue with 30 min of aerobic exercise without signs/symptoms of physical distress. --     Intensity -- THRR unchanged --       Progression   Progression -- Continue to progress workloads to maintain intensity without signs/symptoms of physical distress. Continue to progress workloads to maintain intensity without signs/symptoms of physical distress.     Average METs -- 2.3 2.8       Resistance Training   Training Prescription -- Yes Yes     Weight -- 2 lb 2 lb     Reps -- 10-15 10-15       Interval Training   Interval Training -- No No        Treadmill   MPH -- 2 2     Grade -- 0.5 1     Minutes -- 15 15     METs -- 2.67 2.83       Recumbant Bike   Level -- -- 2     RPM -- -- 16     Watts -- -- 16     Minutes -- -- 15     METs -- -- 2.81       NuStep   Level -- 2 --     Minutes -- 15 --     METs -- 2.1 --       Oxygen   Maintain Oxygen Saturation -- 88% or higher 88% or higher              Exercise Comments:   Exercise Comments     Row Name 09/16/22 1110           Exercise Comments First full day of exercise!  Patient was oriented to gym and equipment including functions, settings, policies, and procedures.  Patient's individual exercise prescription and treatment plan were reviewed.  All starting workloads were established based on the results of the 6 minute walk test done at initial orientation visit.  The plan for exercise progression was also introduced and progression will  be customized based on patient's performance and goals.                Exercise Goals and Review:   Exercise Goals     Row Name 09/07/22 1538             Exercise Goals   Increase Physical Activity Yes       Intervention Provide advice, education, support and counseling about physical activity/exercise needs.;Develop an individualized exercise prescription for aerobic and resistive training based on initial evaluation findings, risk stratification, comorbidities and participant's personal goals.       Expected Outcomes Long Term: Add in home exercise to make exercise part of routine and to increase amount of physical activity.;Long Term: Exercising regularly at least 3-5 days a week.;Short Term: Attend rehab on a regular basis to increase amount of physical activity.       Increase Strength and Stamina Yes       Intervention Provide advice, education, support and counseling about physical activity/exercise needs.;Develop an individualized exercise prescription for aerobic and resistive training based on initial evaluation  findings, risk stratification, comorbidities and participant's personal goals.       Expected Outcomes Short Term: Increase workloads from initial exercise prescription for resistance, speed, and METs.;Short Term: Perform resistance training exercises routinely during rehab and add in resistance training at home;Long Term: Improve cardiorespiratory fitness, muscular endurance and strength as measured by increased METs and functional capacity ( )       Able to understand and use rate of perceived exertion (RPE) scale Yes       Intervention Provide education and explanation on how to use RPE scale       Expected Outcomes Short Term: Able to use RPE daily in rehab to express subjective intensity level;Long Term:  Able to use RPE to guide intensity level when exercising independently       Able to understand and use Dyspnea scale Yes       Intervention Provide education and explanation on how to use Dyspnea scale       Expected Outcomes Short Term: Able to use Dyspnea scale daily in rehab to express subjective sense of shortness of breath during exertion;Long Term: Able to use Dyspnea scale to guide intensity level when exercising independently       Knowledge and understanding of Target Heart Rate Range (THRR) Yes       Intervention Provide education and explanation of THRR including how the numbers were predicted and where they are located for reference       Expected Outcomes Short Term: Able to state/look up THRR;Long Term: Able to use THRR to govern intensity when exercising independently;Short Term: Able to use daily as guideline for intensity in rehab       Able to check pulse independently Yes       Intervention Provide education and demonstration on how to check pulse in carotid and radial arteries.;Review the importance of being able to check your own pulse for safety during independent exercise       Expected Outcomes Short Term: Able to explain why pulse checking is important during  independent exercise;Long Term: Able to check pulse independently and accurately       Understanding of Exercise Prescription Yes       Intervention Provide education, explanation, and written materials on patient's individual exercise prescription       Expected Outcomes Short Term: Able to explain program exercise prescription;Long Term: Able to explain home exercise prescription  to exercise independently                Exercise Goals Re-Evaluation :  Exercise Goals Re-Evaluation     Row Name 09/16/22 1110 09/24/22 1431 10/22/22 0952         Exercise Goal Re-Evaluation   Exercise Goals Review Able to understand and use rate of perceived exertion (RPE) scale;Able to understand and use Dyspnea scale;Knowledge and understanding of Target Heart Rate Range (THRR);Understanding of Exercise Prescription Understanding of Exercise Prescription;Increase Physical Activity;Increase Strength and Stamina Increase Physical Activity;Increase Strength and Stamina;Understanding of Exercise Prescription     Comments Reviewed RPE  and dyspnea scale, THR and program prescription with pt today.  Pt voiced understanding and was given a copy of goals to take home. Kirsten Moore is off to a good start in the program. During her first two sessions of rehab she walked the treadmill at a speed of 2 mph with a o.5% incline. She also worked on the T4 nustep at level 2 and did well with 2 lb hand weights for resistance training. We will continue to monitor her progress. Kirsten Moore has increased workload on the Recumbent bike and the treadmill.  She attended one session during this review time period. He bike wen t from level 1 to level 2. she added 0.5% more incline to the treadmill  2/1%. Her RPE was at 13 and she continued to use 2 lb hand weights.     Expected Outcomes Short: Use RPE daily to regulate intensity. Long: Follow program prescription in THR. Short: Continue to follow initial exercise prescription. Long: Continue to follow  program prescription in THR. Kirsten at least one workload as tolerated with goal of increased stamina and strength.   LTG: Continued exercise progression as tolerated during program and after discharge.              Discharge Exercise Prescription (Final Exercise Prescription Changes):  Exercise Prescription Changes - 10/22/22 0900       Response to Exercise   Blood Pressure (Admit) 108/68    Blood Pressure (Exercise) 138/68    Blood Pressure (Exit) 96/70    Heart Rate (Admit) 94 bpm    Heart Rate (Exercise) 116 bpm    Heart Rate (Exit) 88 bpm    Rating of Perceived Exertion (Exercise) 13      Progression   Progression Continue to progress workloads to maintain intensity without signs/symptoms of physical distress.    Average METs 2.8      Resistance Training   Training Prescription Yes    Weight 2 lb    Reps 10-15      Interval Training   Interval Training No      Treadmill   MPH 2    Grade 1    Minutes 15    METs 2.83      Recumbant Bike   Level 2    RPM 16    Watts 16    Minutes 15    METs 2.81      Oxygen   Maintain Oxygen Saturation 88% or higher             Nutrition:  Target Goals: Understanding of nutrition guidelines, daily intake of sodium 1500mg , cholesterol 200mg , calories 30% from fat and 7% or less from saturated fats, daily to have 5 or more servings of fruits and vegetables.  Education: All About Nutrition: -Group instruction provided by verbal, written material, interactive activities, discussions, models, and posters to present general guidelines  for heart healthy nutrition including fat, fiber, MyPlate, the role of sodium in heart healthy nutrition, utilization of the nutrition label, and utilization of this knowledge for meal planning. Follow up email sent as well. Written material given at graduation. Flowsheet Row Cardiac Rehab from 09/07/2022 in [facility] Cardiac and Pulmonary Rehab  Education need identified 09/07/22        Biometrics:  Pre Biometrics - 09/07/22 1539       Pre Biometrics   Height 5' 0.5" (1.537 m)    Weight 135 lb (61.2 kg)    Waist Circumference 32.5 inches    Hip Circumference 39 inches    Waist to Hip Ratio 0.83 %    BMI (Calculated) 25.92    Single Leg Stand 12.7 seconds              Nutrition Therapy Plan and Nutrition Goals:  Nutrition Therapy & Goals - 09/07/22 1521       Intervention Plan   Intervention Prescribe, educate and counsel regarding individualized specific dietary modifications aiming towards targeted core components such as weight, hypertension, lipid management, diabetes, heart failure and other comorbidities.    Expected Outcomes Short Term Goal: Understand basic principles of dietary content, such as calories, fat, sodium, cholesterol and nutrients.;Short Term Goal: A plan has been developed with personal nutrition goals set during dietitian appointment.;Long Term Goal: Adherence to prescribed nutrition plan.             Nutrition Assessments:  MEDIFICTS Score Key: ?70 Need to make dietary changes  40-70 Heart Healthy Diet ? 40 Therapeutic Level Cholesterol Diet  Flowsheet Row Cardiac Rehab from 09/07/2022 in [facility] Cardiac and Pulmonary Rehab  Picture Your Plate Total Score on Admission 53      Picture Your Plate Scores: <16 Unhealthy dietary pattern with much room for improvement. 41-50 Dietary pattern unlikely to meet recommendations for good health and room for improvement. 51-60 More healthful dietary pattern, with some room for improvement.  >60 Healthy dietary pattern, although there may be some specific behaviors that could be improved.    Nutrition Goals Re-Evaluation:   Nutrition Goals Discharge (Final Nutrition Goals Re-Evaluation):   Psychosocial: Target Goals: Acknowledge presence or absence of significant depression and/or stress, maximize coping skills, provide positive support system. Participant is able to verbalize  types and ability to use techniques and skills needed for reducing stress and depression.   Education: Stress, Anxiety, and Depression - Group verbal and visual presentation to define topics covered.  Reviews how body is impacted by stress, anxiety, and depression.  Also discusses healthy ways to reduce stress and to treat/manage anxiety and depression.  Written material given at graduation.   Education: Sleep Hygiene -Provides group verbal and written instruction about how sleep can affect your health.  Define sleep hygiene, discuss sleep cycles and impact of sleep habits. Review good sleep hygiene tips.    Initial Review & Psychosocial Screening:   Quality of Life Scores:   Quality of Life - 09/07/22 1522       Quality of Life   Select Quality of Life      Quality of Life Scores   Health/Function Pre 24.67 %    Socioeconomic Pre 27.5 %    Psych/Spiritual Pre 24.64 %    Family Pre 22.8 %    GLOBAL Pre 24.97 %            Scores of 19 and below usually indicate a poorer quality of life in these areas.  A difference of  2-3 points is a clinically meaningful difference.  A difference of 2-3 points in the total score of the Quality of Life Index has been associated with significant improvement in overall quality of life, self-image, physical symptoms, and general health in studies assessing change in quality of life.  PHQ-9: Review Flowsheet  More data exists      10/19/2022 09/07/2022 08/27/2022 08/12/2022 06/01/2022  Depression screen PHQ 2/9  Decreased Interest 0 1 0 0 0  Down, Depressed, Hopeless 0 0 0 0 0  PHQ - 2 Score 0 1 0 0 0  Altered sleeping 0 0 0 0 0  Tired, decreased energy 0 1 0 0 0  Change in appetite 0 1 0 0 0  Feeling bad or failure about yourself  0 1 0 0 0  Trouble concentrating 0 0 0 0 0  Moving slowly or fidgety/restless 0 0 0 0 0  Suicidal thoughts 0 0 0 0 0  PHQ-9 Score 0 4 0 0 0  Difficult doing work/chores Not difficult at all Somewhat difficult Not  difficult at all Not difficult at all Not difficult at all    Details           Interpretation of Total Score  Total Score Depression Severity:  1-4 = Minimal depression, 5-9 = Mild depression, 10-14 = Moderate depression, 15-19 = Moderately severe depression, 20-27 = Severe depression   Psychosocial Evaluation and Intervention:   Psychosocial Re-Evaluation:   Psychosocial Discharge (Final Psychosocial Re-Evaluation):   Vocational Rehabilitation: Provide vocational rehab assistance to qualifying candidates.   Vocational Rehab Evaluation & Intervention:   Education: Education Goals: Education classes will be provided on a variety of topics geared toward better understanding of heart health and risk factor modification. Participant will state understanding/return demonstration of topics presented as noted by education test scores.  Learning Barriers/Preferences:   General Cardiac Education Topics:  AED/CPR: - Group verbal and written instruction with the use of models to demonstrate the basic use of the AED with the basic ABC's of resuscitation.   Anatomy and Cardiac Procedures: - Group verbal and visual presentation and models provide information about basic cardiac anatomy and function. Reviews the testing methods done to diagnose heart disease and the outcomes of the test results. Describes the treatment choices: Medical Management, Angioplasty, or Coronary Bypass Surgery for treating various heart conditions including Myocardial Infarction, Angina, Valve Disease, and Cardiac Arrhythmias.  Written material given at graduation.   Medication Safety: - Group verbal and visual instruction to review commonly prescribed medications for heart and lung disease. Reviews the medication, class of the drug, and side effects. Includes the steps to properly store meds and maintain the prescription regimen.  Written material given at graduation.   Intimacy: - Group verbal  instruction through game format to discuss how heart and lung disease can affect sexual intimacy. Written material given at graduation..   Know Your Numbers and Heart Failure: - Group verbal and visual instruction to discuss disease risk factors for cardiac and pulmonary disease and treatment options.  Reviews associated critical values for Overweight/Obesity, Hypertension, Cholesterol, and Diabetes.  Discusses basics of heart failure: signs/symptoms and treatments.  Introduces Heart Failure Zone chart for action plan for heart failure.  Written material given at graduation.   Infection Prevention: - Provides verbal and written material to individual with discussion of infection control including proper hand washing and proper equipment cleaning during exercise session. Flowsheet Row Cardiac Rehab from 09/07/2022 in [facility] Cardiac  and Pulmonary Rehab  Date 09/02/22  Educator [facility]  Instruction Review Code 1- Verbalizes Understanding       Falls Prevention: - Provides verbal and written material to individual with discussion of falls prevention and safety. Flowsheet Row Cardiac Rehab from 09/07/2022 in [facility] Cardiac and Pulmonary Rehab  Date 09/02/22  Educator [facility]  Instruction Review Code 1- Verbalizes Understanding       Other: -Provides group and verbal instruction on various topics (see comments)   Knowledge Questionnaire Score:  Knowledge Questionnaire Score - 09/07/22 1520       Knowledge Questionnaire Score   Pre Score 25/26             Core Components/Risk Factors/Patient Goals at Admission:   Education:Diabetes - Individual verbal and written instruction to review signs/symptoms of diabetes, desired ranges of glucose level fasting, after meals and with exercise. Acknowledge that pre and post exercise glucose checks will be done for 3 sessions at entry of program.   Core Components/Risk Factors/Patient Goals Review:    Core Components/Risk Factors/Patient Goals at  Discharge (Final Review):    ITP Comments:  ITP Comments     Row Name 09/07/22 1517 09/09/22 1125 09/16/22 1110 10/06/22 1459 10/28/22 1002   ITP Comments Completed and gym orientation. Initial ITP created and sent for review to Dr. Bethann Punches, Medical Director. 30 Day review completed. Medical Director ITP review done, changes made as directed, and signed approval by Medical Director.   new to program First full day of exercise!  Patient was oriented to gym and equipment including functions, settings, policies, and procedures.  Patient's individual exercise prescription and treatment plan were reviewed.  All starting workloads were established based on the results of the 6 minute walk test done at initial orientation visit.  The plan for exercise progression was also introduced and progression will be customized based on patient's performance and goals. 30 Day review completed. Medical Director ITP review done, changes made as directed, and signed approval by Medical Director.   new to program [place] graduated today from  rehab with 10 sessions completed.  Details of the patient's exercise prescription and what She needs to do in order to continue the prescription and progress were discussed with patient.  Patient was given a copy of prescription and goals.  Patient verbalized understanding.  Kirsten Moore plans to continue to exercise by walking and using her row machine at home.    Row Name 11/03/22 0935           ITP Comments Kirsten Moore graduated today from  rehab with 10 sessions completed.  Details of the patient's exercise prescription and what She needs to do in order to continue the prescription and progress were discussed with patient.  Patient was given a copy of prescription and goals.  Patient verbalized understanding.  Kirsten Moore plans to continue to exercise by walking and using her row machine at home.                Comments: Discharge ITP

## 2022-11-04 ENCOUNTER — Encounter: Payer: Self-pay | Admitting: *Deleted

## 2022-11-04 DIAGNOSIS — I213 ST elevation (STEMI) myocardial infarction of unspecified site: Secondary | ICD-10-CM

## 2022-11-04 NOTE — Progress Notes (Signed)
Cardiac Individual Treatment Plan  Patient Details  Name: Kirsten Moore MRN: [id number] Date of Birth: 1955/07/01 Referring Provider:   Flowsheet Row Cardiac Rehab from 09/07/2022 in [facility] Cardiac and Pulmonary Rehab  Referring Provider Dr. Elizabeth Sauer, MD       Initial Encounter Date:  Flowsheet Row Cardiac Rehab from 09/07/2022 in [facility] Cardiac and Pulmonary Rehab  Date 09/07/22       Visit Diagnosis: ST elevation myocardial infarction (STEMI), unspecified artery (HCC)  Patient's Home Medications on Admission:  Current Outpatient Medications:    aspirin 81 MG chewable tablet, Chew by mouth., Disp: , Rfl:    aspirin EC 81 MG tablet, Take 81 mg by mouth daily. Swallow whole., Disp: , Rfl:    cephALEXin (KEFLEX) 500 MG capsule, Take 1 capsule (500 mg total) by mouth 3 (three) times daily., Disp: 9 capsule, Rfl: 0   losartan (COZAAR) 25 MG tablet, Take 1 tablet (25 mg total) by mouth daily., Disp: 30 tablet, Rfl: 0   rosuvastatin (CRESTOR) 20 MG tablet, Take 1 tablet by mouth daily., Disp: , Rfl:    ticagrelor (BRILINTA) 90 MG TABS tablet, Take 90 mg by mouth 2 (two) times daily., Disp: , Rfl:    valACYclovir (VALTREX) 500 MG tablet, Take 1 tablet (500 mg total) by mouth 2 (two) times daily., Disp: 30 tablet, Rfl: 6   valACYclovir (VALTREX) 500 MG tablet, Take by mouth. (Patient not taking: Reported on 09/02/2022), Disp: , Rfl:   Past Medical History: Past Medical History:  Diagnosis Date   Hypertension     Tobacco Use: Social History   Tobacco Use  Smoking Status Former   Current packs/day: 0.00   Average packs/day: 1 pack/day for 25.0 years (25.0 ttl pk-yrs)   Types: Cigarettes   Start date: 08/31/1977   Quit date: 09/01/2002   Years since quitting: 20.1  Smokeless Tobacco Never    Labs: Review Flowsheet  More data exists      Latest Ref Rng & Units 04/04/2018 04/19/2019 06/12/2020 08/13/2021 06/01/2022  Labs for ITP Cardiac and Pulmonary Rehab  Cholestrol 100 - 199  mg/dL 829  562  130  865  784   LDL (calc) 0 - 99 mg/dL 696  295  284  132  440   HDL-C >39 mg/dL 78  82  73  77  72   Trlycerides 0 - 149 mg/dL 102  725  366  440  347     Details             Exercise Target Goals: Exercise Program Goal: Individual exercise prescription set using results from initial 6 min walk test and THRR while considering  patient's activity barriers and safety.   Exercise Prescription Goal: Initial exercise prescription builds to 30-45 minutes a day of aerobic activity, 2-3 days per week.  Home exercise guidelines will be given to patient during program as part of exercise prescription that the participant will acknowledge.   Education: Aerobic Exercise: - Group verbal and visual presentation on the components of exercise prescription. Introduces F.I.T.T principle from ACSM for exercise prescriptions.  Reviews F.I.T.T. principles of aerobic exercise including progression. Written material given at graduation.   Education: Resistance Exercise: - Group verbal and visual presentation on the components of exercise prescription. Introduces F.I.T.T principle from ACSM for exercise prescriptions  Reviews F.I.T.T. principles of resistance exercise including progression. Written material given at graduation.    Education: Exercise & Equipment Safety: - Individual verbal instruction and demonstration of equipment  use and safety with use of the equipment. Flowsheet Row Cardiac Rehab from 09/07/2022 in [facility] Cardiac and Pulmonary Rehab  Date 09/02/22  Educator [facility]  Instruction Review Code 1- Verbalizes Understanding       Education: Exercise Physiology & General Exercise Guidelines: - Group verbal and written instruction with models to review the exercise physiology of the cardiovascular system and associated critical values. Provides general exercise guidelines with specific guidelines to those with heart or lung disease.    Education: Flexibility, Balance,  Mind/Body Relaxation: - Group verbal and visual presentation with interactive activity on the components of exercise prescription. Introduces F.I.T.T principle from ACSM for exercise prescriptions. Reviews F.I.T.T. principles of flexibility and balance exercise training including progression. Also discusses the mind body connection.  Reviews various relaxation techniques to help reduce and manage stress (i.e. Deep breathing, progressive muscle relaxation, and visualization). Balance handout provided to take home. Written material given at graduation.   Activity Barriers & Risk Stratification:  Activity Barriers & Cardiac Risk Stratification - 09/07/22 1535       Activity Barriers & Cardiac Risk Stratification   Activity Barriers Other (comment)    Comments Occasional hip and knee pain    Cardiac Risk Stratification Moderate             6 Minute Walk:  6 Minute Walk     Row Name 09/07/22 1534         6 Minute Walk   Phase Initial     Distance 1205 feet     Walk Time 6 minutes     # of Rest Breaks 0     MPH 2.28     METS 2.73     RPE 11     Perceived Dyspnea  0     VO2 Peak 9.57     Symptoms No     Resting HR 65 bpm     Resting BP 118/68     Resting Oxygen Saturation  98 %     Exercise Oxygen Saturation  during 6 min walk 94 %     Max Ex. HR 90 bpm     Max Ex. BP 138/80     2 Minute Post BP 132/76              Oxygen Initial Assessment:   Oxygen Re-Evaluation:   Oxygen Discharge (Final Oxygen Re-Evaluation):   Initial Exercise Prescription:  Initial Exercise Prescription - 09/07/22 1500       Date of Initial Exercise RX and Referring Provider   Date 09/07/22    Referring Provider Dr. Elizabeth Sauer, MD      Oxygen   Maintain Oxygen Saturation 88% or higher      Treadmill   MPH 2    Grade 0.5    Minutes 15    METs 2.67      Recumbant Bike   Level 2    RPM 50    Watts 14    Minutes 15    METs 2.73      NuStep   Level 2    SPM 80     Minutes 15    METs 2.73      Track   Laps 32    Minutes 15    METs 2.74      Prescription Details   Frequency (times per week) 2    Duration Progress to 30 minutes of continuous aerobic without signs/symptoms of physical distress      Intensity  THRR 40-80% of Max Heartrate 99-134    Ratings of Perceived Exertion 11-13    Perceived Dyspnea 0-4      Progression   Progression Continue to progress workloads to maintain intensity without signs/symptoms of physical distress.      Resistance Training   Training Prescription Yes    Weight 2 lb    Reps 10-15             Perform Capillary Blood Glucose checks as needed.  Exercise Prescription Changes:   Exercise Prescription Changes     Row Name 09/07/22 1500 09/24/22 1400 10/22/22 0900         Response to Exercise   Blood Pressure (Admit) 118/68 110/62 108/68     Blood Pressure (Exercise) 138/80 132/90 138/68     Blood Pressure (Exit) 132/76 104/62 96/70     Heart Rate (Admit) 65 bpm 62 bpm 94 bpm     Heart Rate (Exercise) 90 bpm 105 bpm 116 bpm     Heart Rate (Exit) 60 bpm 70 bpm 88 bpm     Oxygen Saturation (Admit) 98 % -- --     Oxygen Saturation (Exercise) 94 % -- --     Rating of Perceived Exertion (Exercise) 11 11 13      Perceived Dyspnea (Exercise) 0 -- --     Symptoms none none --     Comments Results First two days of exercise --     Duration -- Continue with 30 min of aerobic exercise without signs/symptoms of physical distress. --     Intensity -- THRR unchanged --       Progression   Progression -- Continue to progress workloads to maintain intensity without signs/symptoms of physical distress. Continue to progress workloads to maintain intensity without signs/symptoms of physical distress.     Average METs -- 2.3 2.8       Resistance Training   Training Prescription -- Yes Yes     Weight -- 2 lb 2 lb     Reps -- 10-15 10-15       Interval Training   Interval Training -- No No        Treadmill   MPH -- 2 2     Grade -- 0.5 1     Minutes -- 15 15     METs -- 2.67 2.83       Recumbant Bike   Level -- -- 2     RPM -- -- 16     Watts -- -- 16     Minutes -- -- 15     METs -- -- 2.81       NuStep   Level -- 2 --     Minutes -- 15 --     METs -- 2.1 --       Oxygen   Maintain Oxygen Saturation -- 88% or higher 88% or higher              Exercise Comments:   Exercise Comments     Row Name 09/16/22 1110           Exercise Comments First full day of exercise!  Patient was oriented to gym and equipment including functions, settings, policies, and procedures.  Patient's individual exercise prescription and treatment plan were reviewed.  All starting workloads were established based on the results of the 6 minute walk test done at initial orientation visit.  The plan for exercise progression was also introduced and progression will  be customized based on patient's performance and goals.                Exercise Goals and Review:   Exercise Goals     Row Name 09/07/22 1538             Exercise Goals   Increase Physical Activity Yes       Intervention Provide advice, education, support and counseling about physical activity/exercise needs.;Develop an individualized exercise prescription for aerobic and resistive training based on initial evaluation findings, risk stratification, comorbidities and participant's personal goals.       Expected Outcomes Long Term: Add in home exercise to make exercise part of routine and to increase amount of physical activity.;Long Term: Exercising regularly at least 3-5 days a week.;Short Term: Attend rehab on a regular basis to increase amount of physical activity.       Increase Strength and Stamina Yes       Intervention Provide advice, education, support and counseling about physical activity/exercise needs.;Develop an individualized exercise prescription for aerobic and resistive training based on initial evaluation  findings, risk stratification, comorbidities and participant's personal goals.       Expected Outcomes Short Term: Increase workloads from initial exercise prescription for resistance, speed, and METs.;Short Term: Perform resistance training exercises routinely during rehab and add in resistance training at home;Long Term: Improve cardiorespiratory fitness, muscular endurance and strength as measured by increased METs and functional capacity ( )       Able to understand and use rate of perceived exertion (RPE) scale Yes       Intervention Provide education and explanation on how to use RPE scale       Expected Outcomes Short Term: Able to use RPE daily in rehab to express subjective intensity level;Long Term:  Able to use RPE to guide intensity level when exercising independently       Able to understand and use Dyspnea scale Yes       Intervention Provide education and explanation on how to use Dyspnea scale       Expected Outcomes Short Term: Able to use Dyspnea scale daily in rehab to express subjective sense of shortness of breath during exertion;Long Term: Able to use Dyspnea scale to guide intensity level when exercising independently       Knowledge and understanding of Target Heart Rate Range (THRR) Yes       Intervention Provide education and explanation of THRR including how the numbers were predicted and where they are located for reference       Expected Outcomes Short Term: Able to state/look up THRR;Long Term: Able to use THRR to govern intensity when exercising independently;Short Term: Able to use daily as guideline for intensity in rehab       Able to check pulse independently Yes       Intervention Provide education and demonstration on how to check pulse in carotid and radial arteries.;Review the importance of being able to check your own pulse for safety during independent exercise       Expected Outcomes Short Term: Able to explain why pulse checking is important during  independent exercise;Long Term: Able to check pulse independently and accurately       Understanding of Exercise Prescription Yes       Intervention Provide education, explanation, and written materials on patient's individual exercise prescription       Expected Outcomes Short Term: Able to explain program exercise prescription;Long Term: Able to explain home exercise prescription  to exercise independently                Exercise Goals Re-Evaluation :  Exercise Goals Re-Evaluation     Row Name 09/16/22 1110 09/24/22 1431 10/22/22 0952         Exercise Goal Re-Evaluation   Exercise Goals Review Able to understand and use rate of perceived exertion (RPE) scale;Able to understand and use Dyspnea scale;Knowledge and understanding of Target Heart Rate Range (THRR);Understanding of Exercise Prescription Understanding of Exercise Prescription;Increase Physical Activity;Increase Strength and Stamina Increase Physical Activity;Increase Strength and Stamina;Understanding of Exercise Prescription     Comments Reviewed RPE  and dyspnea scale, THR and program prescription with pt today.  Pt voiced understanding and was given a copy of goals to take home. Trivia is off to a good start in the program. During her first two sessions of rehab she walked the treadmill at a speed of 2 mph with a o.5% incline. She also worked on the T4 nustep at level 2 and did well with 2 lb hand weights for resistance training. We will continue to monitor her progress. Lameka has increased workload on the Recumbent bike and the treadmill.  She attended one session during this review time period. He bike wen t from level 1 to level 2. she added 0.5% more incline to the treadmill  2/1%. Her RPE was at 13 and she continued to use 2 lb hand weights.     Expected Outcomes Short: Use RPE daily to regulate intensity. Long: Follow program prescription in THR. Short: Continue to follow initial exercise prescription. Long: Continue to follow  program prescription in THR. [id number] at least one workload as tolerated with goal of increased stamina and strength.   LTG: Continued exercise progression as tolerated during program and after discharge.              Discharge Exercise Prescription (Final Exercise Prescription Changes):  Exercise Prescription Changes - 10/22/22 0900       Response to Exercise   Blood Pressure (Admit) 108/68    Blood Pressure (Exercise) 138/68    Blood Pressure (Exit) 96/70    Heart Rate (Admit) 94 bpm    Heart Rate (Exercise) 116 bpm    Heart Rate (Exit) 88 bpm    Rating of Perceived Exertion (Exercise) 13      Progression   Progression Continue to progress workloads to maintain intensity without signs/symptoms of physical distress.    Average METs 2.8      Resistance Training   Training Prescription Yes    Weight 2 lb    Reps 10-15      Interval Training   Interval Training No      Treadmill   MPH 2    Grade 1    Minutes 15    METs 2.83      Recumbant Bike   Level 2    RPM 16    Watts 16    Minutes 15    METs 2.81      Oxygen   Maintain Oxygen Saturation 88% or higher             Nutrition:  Target Goals: Understanding of nutrition guidelines, daily intake of sodium 1500mg , cholesterol 200mg , calories 30% from fat and 7% or less from saturated fats, daily to have 5 or more servings of fruits and vegetables.  Education: All About Nutrition: -Group instruction provided by verbal, written material, interactive activities, discussions, models, and posters to present general guidelines  for heart healthy nutrition including fat, fiber, MyPlate, the role of sodium in heart healthy nutrition, utilization of the nutrition label, and utilization of this knowledge for meal planning. Follow up email sent as well. Written material given at graduation. Flowsheet Row Cardiac Rehab from 09/07/2022 in [facility] Cardiac and Pulmonary Rehab  Education need identified 09/07/22        Biometrics:  Pre Biometrics - 09/07/22 1539       Pre Biometrics   Height 5' 0.5" (1.537 m)    Weight 135 lb (61.2 kg)    Waist Circumference 32.5 inches    Hip Circumference 39 inches    Waist to Hip Ratio 0.83 %    BMI (Calculated) 25.92    Single Leg Stand 12.7 seconds              Nutrition Therapy Plan and Nutrition Goals:  Nutrition Therapy & Goals - 09/07/22 1521       Intervention Plan   Intervention Prescribe, educate and counsel regarding individualized specific dietary modifications aiming towards targeted core components such as weight, hypertension, lipid management, diabetes, heart failure and other comorbidities.    Expected Outcomes Short Term Goal: Understand basic principles of dietary content, such as calories, fat, sodium, cholesterol and nutrients.;Short Term Goal: A plan has been developed with personal nutrition goals set during dietitian appointment.;Long Term Goal: Adherence to prescribed nutrition plan.             Nutrition Assessments:  MEDIFICTS Score Key: ?70 Need to make dietary changes  40-70 Heart Healthy Diet ? 40 Therapeutic Level Cholesterol Diet  Flowsheet Row Cardiac Rehab from 09/07/2022 in [facility] Cardiac and Pulmonary Rehab  Picture Your Plate Total Score on Admission 53      Picture Your Plate Scores: <03 Unhealthy dietary pattern with much room for improvement. 41-50 Dietary pattern unlikely to meet recommendations for good health and room for improvement. 51-60 More healthful dietary pattern, with some room for improvement.  >60 Healthy dietary pattern, although there may be some specific behaviors that could be improved.    Nutrition Goals Re-Evaluation:   Nutrition Goals Discharge (Final Nutrition Goals Re-Evaluation):   Psychosocial: Target Goals: Acknowledge presence or absence of significant depression and/or stress, maximize coping skills, provide positive support system. Participant is able to verbalize  types and ability to use techniques and skills needed for reducing stress and depression.   Education: Stress, Anxiety, and Depression - Group verbal and visual presentation to define topics covered.  Reviews how body is impacted by stress, anxiety, and depression.  Also discusses healthy ways to reduce stress and to treat/manage anxiety and depression.  Written material given at graduation.   Education: Sleep Hygiene -Provides group verbal and written instruction about how sleep can affect your health.  Define sleep hygiene, discuss sleep cycles and impact of sleep habits. Review good sleep hygiene tips.    Initial Review & Psychosocial Screening:   Quality of Life Scores:   Quality of Life - 09/07/22 1522       Quality of Life   Select Quality of Life      Quality of Life Scores   Health/Function Pre 24.67 %    Socioeconomic Pre 27.5 %    Psych/Spiritual Pre 24.64 %    Family Pre 22.8 %    GLOBAL Pre 24.97 %            Scores of 19 and below usually indicate a poorer quality of life in these areas.  A difference of  2-3 points is a clinically meaningful difference.  A difference of 2-3 points in the total score of the Quality of Life Index has been associated with significant improvement in overall quality of life, self-image, physical symptoms, and general health in studies assessing change in quality of life.  PHQ-9: Review Flowsheet  More data exists      10/19/2022 09/07/2022 08/27/2022 08/12/2022 06/01/2022  Depression screen PHQ 2/9  Decreased Interest 0 1 0 0 0  Down, Depressed, Hopeless 0 0 0 0 0  PHQ - 2 Score 0 1 0 0 0  Altered sleeping 0 0 0 0 0  Tired, decreased energy 0 1 0 0 0  Change in appetite 0 1 0 0 0  Feeling bad or failure about yourself  0 1 0 0 0  Trouble concentrating 0 0 0 0 0  Moving slowly or fidgety/restless 0 0 0 0 0  Suicidal thoughts 0 0 0 0 0  PHQ-9 Score 0 4 0 0 0  Difficult doing work/chores Not difficult at all Somewhat difficult Not  difficult at all Not difficult at all Not difficult at all    Details           Interpretation of Total Score  Total Score Depression Severity:  1-4 = Minimal depression, 5-9 = Mild depression, 10-14 = Moderate depression, 15-19 = Moderately severe depression, 20-27 = Severe depression   Psychosocial Evaluation and Intervention:   Psychosocial Re-Evaluation:   Psychosocial Discharge (Final Psychosocial Re-Evaluation):   Vocational Rehabilitation: Provide vocational rehab assistance to qualifying candidates.   Vocational Rehab Evaluation & Intervention:   Education: Education Goals: Education classes will be provided on a variety of topics geared toward better understanding of heart health and risk factor modification. Participant will state understanding/return demonstration of topics presented as noted by education test scores.  Learning Barriers/Preferences:   General Cardiac Education Topics:  AED/CPR: - Group verbal and written instruction with the use of models to demonstrate the basic use of the AED with the basic ABC's of resuscitation.   Anatomy and Cardiac Procedures: - Group verbal and visual presentation and models provide information about basic cardiac anatomy and function. Reviews the testing methods done to diagnose heart disease and the outcomes of the test results. Describes the treatment choices: Medical Management, Angioplasty, or Coronary Bypass Surgery for treating various heart conditions including Myocardial Infarction, Angina, Valve Disease, and Cardiac Arrhythmias.  Written material given at graduation.   Medication Safety: - Group verbal and visual instruction to review commonly prescribed medications for heart and lung disease. Reviews the medication, class of the drug, and side effects. Includes the steps to properly store meds and maintain the prescription regimen.  Written material given at graduation.   Intimacy: - Group verbal  instruction through game format to discuss how heart and lung disease can affect sexual intimacy. Written material given at graduation..   Know Your Numbers and Heart Failure: - Group verbal and visual instruction to discuss disease risk factors for cardiac and pulmonary disease and treatment options.  Reviews associated critical values for Overweight/Obesity, Hypertension, Cholesterol, and Diabetes.  Discusses basics of heart failure: signs/symptoms and treatments.  Introduces Heart Failure Zone chart for action plan for heart failure.  Written material given at graduation.   Infection Prevention: - Provides verbal and written material to individual with discussion of infection control including proper hand washing and proper equipment cleaning during exercise session. Flowsheet Row Cardiac Rehab from 09/07/2022 in [facility] Cardiac  and Pulmonary Rehab  Date 09/02/22  Educator [facility]  Instruction Review Code 1- Verbalizes Understanding       Falls Prevention: - Provides verbal and written material to individual with discussion of falls prevention and safety. Flowsheet Row Cardiac Rehab from 09/07/2022 in [facility] Cardiac and Pulmonary Rehab  Date 09/02/22  Educator [facility]  Instruction Review Code 1- Verbalizes Understanding       Other: -Provides group and verbal instruction on various topics (see comments)   Knowledge Questionnaire Score:  Knowledge Questionnaire Score - 09/07/22 1520       Knowledge Questionnaire Score   Pre Score 25/26             Core Components/Risk Factors/Patient Goals at Admission:   Education:Diabetes - Individual verbal and written instruction to review signs/symptoms of diabetes, desired ranges of glucose level fasting, after meals and with exercise. Acknowledge that pre and post exercise glucose checks will be done for 3 sessions at entry of program.   Core Components/Risk Factors/Patient Goals Review:    Core Components/Risk Factors/Patient Goals at  Discharge (Final Review):    ITP Comments:  ITP Comments     Row Name 09/07/22 1517 09/09/22 1125 09/16/22 1110 10/06/22 1459 10/28/22 1002   ITP Comments Completed and gym orientation. Initial ITP created and sent for review to Dr. Bethann Punches, Medical Director. 30 Day review completed. Medical Director ITP review done, changes made as directed, and signed approval by Medical Director.   new to program First full day of exercise!  Patient was oriented to gym and equipment including functions, settings, policies, and procedures.  Patient's individual exercise prescription and treatment plan were reviewed.  All starting workloads were established based on the results of the 6 minute walk test done at initial orientation visit.  The plan for exercise progression was also introduced and progression will be customized based on patient's performance and goals. 30 Day review completed. Medical Director ITP review done, changes made as directed, and signed approval by Medical Director.   new to program [place] graduated today from  rehab with 10 sessions completed.  Details of the patient's exercise prescription and what She needs to do in order to continue the prescription and progress were discussed with patient.  Patient was given a copy of prescription and goals.  Patient verbalized understanding.  Kimiko plans to continue to exercise by walking and using her row machine at home.    Row Name 11/03/22 0935 11/04/22 1009         ITP Comments Marcianne graduated today from  rehab with 10 sessions completed.  Details of the patient's exercise prescription and what She needs to do in order to continue the prescription and progress were discussed with patient.  Patient was given a copy of prescription and goals.  Patient verbalized understanding.  Tayia plans to continue to exercise by walking and using her row machine at home. Cyndal graduated today from  rehab with 10 sessions completed.  Details of the patient's  exercise prescription and what She needs to do in order to continue the prescription and progress were discussed with patient.  Patient was given a copy of prescription and goals.  Patient verbalized understanding.  Lauren plans to continue to exercise by walking and using her row machine at home.               Comments: Discharge ITP

## 2022-11-25 ENCOUNTER — Other Ambulatory Visit: Payer: Self-pay | Admitting: Family Medicine

## 2022-11-25 DIAGNOSIS — I2510 Atherosclerotic heart disease of native coronary artery without angina pectoris: Secondary | ICD-10-CM

## 2022-11-25 DIAGNOSIS — I10 Essential (primary) hypertension: Secondary | ICD-10-CM

## 2022-11-26 NOTE — Telephone Encounter (Signed)
Requested medication (s) are due for refill today: Yes  Requested medication (s) are on the active medication list: Yes  Last refill:  08/27/22  Future visit scheduled: Yes  Notes to clinic:  Pharmacy requests diagnosis.    Requested Prescriptions  Pending Prescriptions Disp Refills   losartan (COZAAR) 25 MG tablet [Pharmacy Med Name: LOSARTAN POTASSIUM 25 MG TAB] 90 tablet 1    Sig: Take 1 tablet (25 mg total) by mouth daily.     Cardiovascular:  Angiotensin Receptor Blockers Passed - 11/25/2022 11:31 AM      Passed - Cr in normal range and within 180 days    Creatinine  Date Value Ref Range Status  07/01/2011 1.02 0.60 - 1.30 mg/dL Final   Creatinine, Ser  Date Value Ref Range Status  06/01/2022 0.71 0.57 - 1.00 mg/dL Final         Passed - K in normal range and within 180 days    Potassium  Date Value Ref Range Status  06/01/2022 4.3 3.5 - 5.2 mmol/L Final  07/01/2011 3.2 (L) 3.5 - 5.1 mmol/L Final         Passed - Patient is not pregnant      Passed - Last BP in normal range    BP Readings from Last 1 Encounters:  10/19/22 128/74         Passed - Valid encounter within last 6 months    Recent Outpatient Visits           1 month ago Dysuria   [facility]   3 months ago Coronary artery disease involving native coronary artery of native heart without angina pectoris   [facility]   5 months ago Transient vision disturbance   [facility]   1 year ago Acute maxillary sinusitis, recurrence not specified   [facility]   1 year ago Annual physical exam   [facility]       Future Appointments             In 1  month [name], MD [facility] at [facility], [facility]

## 2022-12-23 ENCOUNTER — Ambulatory Visit
Admission: RE | Admit: 2022-12-23 | Discharge: 2022-12-23 | Disposition: A | Discharge status: Home / Self Care | Payer: Medicare HMO | Source: Ambulatory Visit | Attending: Family Medicine | Admitting: Family Medicine

## 2022-12-23 DIAGNOSIS — Z1231 Encounter for screening mammogram for malignant neoplasm of breast: Secondary | ICD-10-CM | POA: Diagnosis not present

## 2022-12-29 ENCOUNTER — Ambulatory Visit (INDEPENDENT_AMBULATORY_CARE_PROVIDER_SITE_OTHER): Payer: Medicare HMO | Admitting: Family Medicine

## 2022-12-29 ENCOUNTER — Encounter: Payer: Self-pay | Admitting: Family Medicine

## 2022-12-29 VITALS — BP 134/82 | HR 62 | Ht 60.0 in | Wt 130.6 lb

## 2022-12-29 DIAGNOSIS — I10 Essential (primary) hypertension: Secondary | ICD-10-CM | POA: Diagnosis not present

## 2022-12-29 DIAGNOSIS — I2510 Atherosclerotic heart disease of native coronary artery without angina pectoris: Secondary | ICD-10-CM | POA: Diagnosis not present

## 2022-12-29 DIAGNOSIS — Z23 Encounter for immunization: Secondary | ICD-10-CM

## 2022-12-29 MED ORDER — LOSARTAN POTASSIUM 25 MG PO TABS
25.0000 mg | ORAL_TABLET | Freq: Every day | ORAL | 1 refills | Status: DC
Start: 2022-12-29 — End: 2023-06-17

## 2022-12-29 NOTE — Progress Notes (Signed)
Date:  12/29/2022   Name:  Kirsten Moore   DOB:  1955/12/04   MRN:  [id number]   Chief Complaint: Hypertension  Hypertension This is a chronic problem. The current episode started more than 1 year ago. The problem has been gradually improving since onset. The problem is controlled. Pertinent negatives include no anxiety, blurred vision, chest pain, headaches, malaise/fatigue, neck pain, orthopnea, palpitations, peripheral edema, PND, shortness of breath or sweats. There are no associated agents to hypertension. There are no known risk factors for coronary artery disease.    Lab Results  Component Value Date   NA 141 06/01/2022   K 4.3 06/01/2022   CO2 22 06/01/2022   GLUCOSE 88 06/01/2022   BUN 11 06/01/2022   CREATININE 0.71 06/01/2022   CALCIUM 9.2 06/01/2022   EGFR 93 06/01/2022   GFRNONAA 76 04/19/2019   Lab Results  Component Value Date   CHOL 272 (H) 06/01/2022   HDL 72 06/01/2022   LDLCALC 175 (H) 06/01/2022   TRIG 139 06/01/2022   CHOLHDL 3.5 04/04/2018   No results found for: "TSH" No results found for: "HGBA1C" Lab Results  Component Value Date   WBC 7.7 08/13/2021   HGB 14.1 08/13/2021   HCT 41.1 08/13/2021   MCV 97 08/13/2021   PLT 311 08/13/2021   Lab Results  Component Value Date   ALT 14 06/01/2022   AST 19 06/01/2022   ALKPHOS 106 06/01/2022   BILITOT 0.3 06/01/2022   No results found for: "25OHVITD2", "25OHVITD3", "VD25OH"   Review of Systems  Constitutional:  Negative for malaise/fatigue.  HENT:  Negative for congestion.   Eyes:  Negative for blurred vision.  Respiratory:  Negative for cough, chest tightness, shortness of breath and wheezing.   Cardiovascular:  Negative for chest pain, palpitations, orthopnea, leg swelling and PND.  Gastrointestinal:  Negative for abdominal distention, abdominal pain and blood in stool.  Genitourinary:  Negative for hematuria.  Musculoskeletal:  Negative for neck pain.  Neurological:  Negative for  headaches.    Patient Active Problem List   Diagnosis Date Noted   AVNRT (AV nodal re-entry tachycardia) (HCC) 09/03/2015   Osteoporosis 01/22/2015    Allergies  Allergen Reactions   Sulfa Antibiotics Nausea And Vomiting   Zetia [Ezetimibe]     Burning in abdomen   Misc. Sulfonamide Containing Compounds Nausea And Vomiting    Past Surgical History:  Procedure Laterality Date   BREAST BIOPSY Left 08/05/2020   stereo bx, x clip, calcs, path pending.   BUNIONECTOMY Left    CARDIAC ELECTROPHYSIOLOGY MAPPING AND ABLATION     CARDIAC ELECTROPHYSIOLOGY STUDY AND ABLATION     COLONOSCOPY  2011   cleared for 10 yrs- [facility]   VAGINAL HYSTERECTOMY     partial    Social History   Tobacco Use   Smoking status: Former    Current packs/day: 0.00    Average packs/day: 1 pack/day for 25.0 years (25.0 ttl pk-yrs)    Types: Cigarettes    Start date: 08/31/1977    Quit date: 09/01/2002    Years since quitting: 20.3   Smokeless tobacco: Never  Vaping Use   Vaping status: Never Used  Substance Use Topics   Alcohol use: No    Alcohol/week: 0.0 standard drinks of alcohol   Drug use: No     Medication list has been reviewed and updated.  Current Meds  Medication Sig   aspirin 81 MG chewable tablet Chew by mouth.  clopidogrel (PLAVIX) 75 MG tablet Take 75 mg by mouth daily.   losartan (COZAAR) 25 MG tablet TAKE 1 TABLET (25 MG TOTAL) BY MOUTH DAILY.   rosuvastatin (CRESTOR) 20 MG tablet Take 1 tablet by mouth daily.   valACYclovir (VALTREX) 500 MG tablet Take 1 tablet (500 mg total) by mouth 2 (two) times daily.   [DISCONTINUED] aspirin EC 81 MG tablet Take 81 mg by mouth daily. Swallow whole.   [DISCONTINUED] ticagrelor (BRILINTA) 90 MG TABS tablet Take 90 mg by mouth 2 (two) times daily.       12/29/2022    9:29 AM 10/19/2022    3:03 PM 08/27/2022    2:07 PM 06/01/2022   10:59 AM  GAD 7 : Generalized Anxiety Score  Nervous, Anxious, on Edge 0 0 0 0  Control/stop worrying 0 0 0  0  Worry too much - different things 0 0 0 0  Trouble relaxing 0 0 0 0  Restless 0 0 0 0  Easily annoyed or irritable 0 0 0 0  Afraid - awful might happen 0 0 0 0  Total GAD 7 Score 0 0 0 0  Anxiety Difficulty Not difficult at all Not difficult at all Not difficult at all Not difficult at all       12/29/2022    9:29 AM 10/19/2022    3:03 PM 09/07/2022    3:20 PM  Depression screen PHQ 2/9  Decreased Interest 0 0 1  Down, Depressed, Hopeless 0 0 0  PHQ - 2 Score 0 0 1  Altered sleeping 0 0 0  Tired, decreased energy 0 0 1  Change in appetite 0 0 1  Feeling bad or failure about yourself  0 0 1  Trouble concentrating 0 0 0  Moving slowly or fidgety/restless 0 0 0  Suicidal thoughts 0 0 0  PHQ-9 Score 0 0 4  Difficult doing work/chores Not difficult at all Not difficult at all Somewhat difficult    BP Readings from Last 3 Encounters:  12/29/22 134/82  10/19/22 128/74  08/27/22 128/82    Physical Exam Vitals and nursing note reviewed. Exam conducted with a chaperone present.  Constitutional:      General: She is not in acute distress.    Appearance: She is not diaphoretic.  HENT:     Head: Normocephalic and atraumatic.     Right Ear: Tympanic membrane and external ear normal.     Left Ear: Tympanic membrane and external ear normal.     Nose: Nose normal. No congestion or rhinorrhea.     Mouth/Throat:     Mouth: Mucous membranes are moist.     Pharynx: No oropharyngeal exudate or posterior oropharyngeal erythema.  Eyes:     General:        Right eye: No discharge.        Left eye: No discharge.     Conjunctiva/sclera: Conjunctivae normal.     Pupils: Pupils are equal, round, and reactive to light.  Neck:     Thyroid: No thyromegaly.     Vascular: No JVD.  Cardiovascular:     Rate and Rhythm: Normal rate and regular rhythm.     Heart sounds: Normal heart sounds. No murmur heard.    No friction rub. No gallop.  Pulmonary:     Effort: Pulmonary effort is normal.      Breath sounds: Normal breath sounds. No wheezing, rhonchi or rales.  Abdominal:     General: Bowel sounds are  normal.     Palpations: Abdomen is soft. There is no mass.     Tenderness: There is no abdominal tenderness. There is no guarding or rebound.  Musculoskeletal:        General: Normal range of motion.     Cervical back: Normal range of motion and neck supple.  Lymphadenopathy:     Cervical: No cervical adenopathy.  Skin:    General: Skin is warm and dry.     Wt Readings from Last 3 Encounters:  12/29/22 130 lb 9.6 oz (59.2 kg)  10/19/22 132 lb (59.9 kg)  09/07/22 135 lb (61.2 kg)    BP 134/82   Pulse 62   Ht 5' (1.524 m)   Wt 130 lb 9.6 oz (59.2 kg)   SpO2 93%   BMI 25.51 kg/m   Assessment and Plan:  1. Coronary artery disease involving native coronary artery of native heart without angina pectoris Chronic.  Controlled.  Stable.  No episodes of chest pain diaphoresis nausea or palpitations.  Patient has had no problems with taking losartan at 12.5 mg.  We will make an adjustment below - losartan (COZAAR) 25 MG tablet; Take 1 tablet (25 mg total) by mouth daily.  Dispense: 90 tablet; Refill: 1  2. Primary hypertension Chronic.  Not quite in the control range that I would like.  Stable.  Patient is only taking a half a tablet however she is tolerating that well and I have asked her to go up to a whole tablet once a day and will recheck in 6 months or sooner if needed. - losartan (COZAAR) 25 MG tablet; Take 1 tablet (25 mg total) by mouth daily.  Dispense: 90 tablet; Refill: 1    Elizabeth Sauer, MD

## 2023-02-05 ENCOUNTER — Encounter: Payer: Self-pay | Admitting: Family Medicine

## 2023-02-05 ENCOUNTER — Ambulatory Visit (INDEPENDENT_AMBULATORY_CARE_PROVIDER_SITE_OTHER): Payer: Medicare HMO | Admitting: Family Medicine

## 2023-02-05 VITALS — BP 126/78 | HR 70 | Temp 97.6°F | Ht 60.0 in | Wt 129.0 lb

## 2023-02-05 DIAGNOSIS — J0100 Acute maxillary sinusitis, unspecified: Secondary | ICD-10-CM | POA: Diagnosis not present

## 2023-02-05 DIAGNOSIS — R051 Acute cough: Secondary | ICD-10-CM | POA: Diagnosis not present

## 2023-02-05 MED ORDER — PROMETHAZINE-DM 6.25-15 MG/5ML PO SYRP: 5.0000 mL | ORAL_SOLUTION | Freq: Four times a day (QID) | ORAL | 0 refills | Status: DC | PRN

## 2023-02-05 MED ORDER — CEPHALEXIN 500 MG PO CAPS: 500.0000 mg | ORAL_CAPSULE | Freq: Four times a day (QID) | ORAL | 0 refills | Status: DC

## 2023-02-05 NOTE — Progress Notes (Signed)
Date:  02/05/2023   Name:  Kirsten Moore   DOB:  04-Jan-1956   MRN:  [id number]   Chief Complaint: Cough (Cough and congestion started a few days ago. Cough- no production. Covid test negative. No fever, and only sob when coughing. )  Sinusitis This is a new problem. The current episode started in the past 7 days. The problem is unchanged. There has been no fever. Associated symptoms include congestion, coughing, sinus pressure and a sore throat. Pertinent negatives include no chills, diaphoresis, ear pain, headaches, shortness of breath, sneezing or swollen glands. Past treatments include oral decongestants and acetaminophen. The treatment provided no relief.    Lab Results  Component Value Date   NA 141 06/01/2022   K 4.3 06/01/2022   CO2 22 06/01/2022   GLUCOSE 88 06/01/2022   BUN 11 06/01/2022   CREATININE 0.71 06/01/2022   CALCIUM 9.2 06/01/2022   EGFR 93 06/01/2022   GFRNONAA 76 04/19/2019   Lab Results  Component Value Date   CHOL 272 (H) 06/01/2022   HDL 72 06/01/2022   LDLCALC 175 (H) 06/01/2022   TRIG 139 06/01/2022   CHOLHDL 3.5 04/04/2018   No results found for: "TSH" No results found for: "HGBA1C" Lab Results  Component Value Date   WBC 7.7 08/13/2021   HGB 14.1 08/13/2021   HCT 41.1 08/13/2021   MCV 97 08/13/2021   PLT 311 08/13/2021   Lab Results  Component Value Date   ALT 14 06/01/2022   AST 19 06/01/2022   ALKPHOS 106 06/01/2022   BILITOT 0.3 06/01/2022   No results found for: "25OHVITD2", "25OHVITD3", "VD25OH"   Review of Systems  Constitutional:  Negative for chills and diaphoresis.  HENT:  Positive for congestion, postnasal drip, rhinorrhea, sinus pressure, sinus pain and sore throat. Negative for ear pain, nosebleeds, sneezing and tinnitus.   Respiratory:  Positive for cough. Negative for chest tightness, shortness of breath and wheezing.   Cardiovascular:  Negative for chest pain, palpitations and leg swelling.  Neurological:   Negative for headaches.    Patient Active Problem List   Diagnosis Date Noted   AVNRT (AV nodal re-entry tachycardia) (HCC) 09/03/2015   Osteoporosis 01/22/2015    Allergies  Allergen Reactions   Sulfa Antibiotics Nausea And Vomiting   Zetia [Ezetimibe]     Burning in abdomen   Misc. Sulfonamide Containing Compounds Nausea And Vomiting    Past Surgical History:  Procedure Laterality Date   BREAST BIOPSY Left 08/05/2020   stereo bx, x clip, calcs, path pending.   BUNIONECTOMY Left    CARDIAC ELECTROPHYSIOLOGY MAPPING AND ABLATION     CARDIAC ELECTROPHYSIOLOGY STUDY AND ABLATION     COLONOSCOPY  2011   cleared for 10 yrs- [facility]   VAGINAL HYSTERECTOMY     partial    Social History   Tobacco Use   Smoking status: Former    Current packs/day: 0.00    Average packs/day: 1 pack/day for 25.0 years (25.0 ttl pk-yrs)    Types: Cigarettes    Start date: 08/31/1977    Quit date: 09/01/2002    Years since quitting: 20.4   Smokeless tobacco: Never  Vaping Use   Vaping status: Never Used  Substance Use Topics   Alcohol use: No    Alcohol/week: 0.0 standard drinks of alcohol   Drug use: No     Medication list has been reviewed and updated.  Current Meds  Medication Sig   aspirin 81 MG chewable  tablet Chew by mouth.   clopidogrel (PLAVIX) 75 MG tablet Take 75 mg by mouth daily.   losartan (COZAAR) 25 MG tablet Take 1 tablet (25 mg total) by mouth daily.   rosuvastatin (CRESTOR) 20 MG tablet Take 1 tablet by mouth daily.   valACYclovir (VALTREX) 500 MG tablet Take 1 tablet (500 mg total) by mouth 2 (two) times daily.       02/05/2023   11:04 AM 12/29/2022    9:29 AM 10/19/2022    3:03 PM 08/27/2022    2:07 PM  GAD 7 : Generalized Anxiety Score  Nervous, Anxious, on Edge 0 0 0 0  Control/stop worrying 0 0 0 0  Worry too much - different things 0 0 0 0  Trouble relaxing 0 0 0 0  Restless 0 0 0 0  Easily annoyed or irritable 0 0 0 0  Afraid - awful might happen 0 0 0 0   Total GAD 7 Score 0 0 0 0  Anxiety Difficulty Not difficult at all Not difficult at all Not difficult at all Not difficult at all       02/05/2023   11:04 AM 12/29/2022    9:29 AM 10/19/2022    3:03 PM  Depression screen PHQ 2/9  Decreased Interest 0 0 0  Down, Depressed, Hopeless 0 0 0  PHQ - 2 Score 0 0 0  Altered sleeping 0 0 0  Tired, decreased energy 0 0 0  Change in appetite 0 0 0  Feeling bad or failure about yourself  0 0 0  Trouble concentrating 0 0 0  Moving slowly or fidgety/restless 0 0 0  Suicidal thoughts 0 0 0  PHQ-9 Score 0 0 0  Difficult doing work/chores Not difficult at all Not difficult at all Not difficult at all    BP Readings from Last 3 Encounters:  02/05/23 126/78  12/29/22 134/82  10/19/22 128/74    Physical Exam Vitals and nursing note reviewed. Exam conducted with a chaperone present.  Constitutional:      General: She is not in acute distress.    Appearance: She is not diaphoretic.  HENT:     Head: Normocephalic and atraumatic.     Right Ear: Tympanic membrane, ear canal and external ear normal.     Left Ear: Tympanic membrane, ear canal and external ear normal.     Nose:     Right Sinus: Maxillary sinus tenderness present.     Left Sinus: Maxillary sinus tenderness present.     Mouth/Throat:     Mouth: Mucous membranes are moist.     Tongue: No lesions.     Palate: No mass.     Pharynx: Oropharynx is clear. Uvula midline. No pharyngeal swelling.     Tonsils: No tonsillar exudate.  Eyes:     General:        Right eye: No discharge.        Left eye: No discharge.     Conjunctiva/sclera: Conjunctivae normal.     Pupils: Pupils are equal, round, and reactive to light.  Neck:     Thyroid: No thyromegaly.     Vascular: No JVD.  Cardiovascular:     Rate and Rhythm: Normal rate and regular rhythm.     Heart sounds: Normal heart sounds. No murmur heard.    No friction rub. No gallop.  Pulmonary:     Effort: Pulmonary effort is normal.      Breath sounds: Normal breath sounds. No  decreased breath sounds, wheezing, rhonchi or rales.  Abdominal:     General: Bowel sounds are normal.     Palpations: Abdomen is soft. There is no mass.     Tenderness: There is no abdominal tenderness. There is no guarding.  Musculoskeletal:        General: Normal range of motion.     Cervical back: Normal range of motion and neck supple.  Lymphadenopathy:     Cervical: No cervical adenopathy.  Skin:    General: Skin is warm and dry.  Neurological:     Mental Status: She is alert.     Deep Tendon Reflexes: Reflexes are normal and symmetric.     Wt Readings from Last 3 Encounters:  02/05/23 129 lb (58.5 kg)  12/29/22 130 lb 9.6 oz (59.2 kg)  10/19/22 132 lb (59.9 kg)    BP 126/78   Pulse 70   Temp 97.6 F (36.4 C) (Oral)   Ht 5' (1.524 m)   Wt 129 lb (58.5 kg)   SpO2 98%   BMI 25.19 kg/m   Assessment and Plan: 1. Acute maxillary sinusitis, recurrence not specified New onset.  Persistent.  Unable to sleep at night due to cough and has purulent drainage that she is coughing up probably from postnasal drainage source.  Tenderness is noted over bilateral sinuses and postnasal drainage is seen in the back that has a yellow tinge to it we will treat with cephalexin capsules 500 mg 4 times a day for 10 days and encourage fluids to be taken as well. - cephALEXin (KEFLEX) 500 MG capsule; Take 1 capsule (500 mg total) by mouth 4 (four) times daily.  Dispense: 40 capsule; Refill: 0  2. Acute cough Persistent.  Acute.  Patient has cough that is taking her to the point that she almost had a syncopal episode.  We will suppress with promethazine with dextromethorphan and will recheck on an as-needed basis lungs are clear to percussion and auscultation. - promethazine-dextromethorphan (PROMETHAZINE-DM) 6.25-15 MG/5ML syrup; Take 5 mLs by mouth 4 (four) times daily as needed.  Dispense: 118 mL; Refill: 0     Elizabeth Sauer, MD

## 2023-04-30 ENCOUNTER — Telehealth: Payer: Self-pay | Admitting: *Deleted

## 2023-04-30 NOTE — Patient Outreach (Signed)
  Care Coordination   Follow Up Visit Note   04/30/2023 Name: Kirsten Moore MRN: [id number] DOB: [birth year]  Kirsten Moore is a 68 y.o. year old female who sees [name], MD for primary care. I spoke with  [name] by phone today.  What matters to the patients health and wellness today?  Patient report she is doing well, no urgent complaints.  Cardiac rehab has been completed.     Goals Addressed             This Visit's Progress    COMPLETED: Recovery from STEMI   On track    Care Coordination Interventions: Provided education on importance of blood pressure control in management of CAD Provided education on Importance of limiting foods high in cholesterol Counseled on importance of regular laboratory monitoring as prescribed Reviewed Importance of taking all medications as prescribed Reviewed Importance of attending all scheduled provider appointments Advised patient to discuss FMLA with provider Screening for signs and symptoms of depression related to chronic disease state Assessed social determinant of health barriers         SDOH assessments and interventions completed:  No     Care Coordination Interventions:  Yes, provided   Interventions Today    Flowsheet Row Most Recent Value  Chronic Disease   Chronic disease during today's visit Other  [CAD/STEMI]  General Interventions   General Interventions Discussed/Reviewed General Interventions Reviewed, Doctor Visits  Doctor Visits Discussed/Reviewed Doctor Visits Reviewed, PCP, Specialist, Annual Wellness Visits  [Reviewed upcoming: Cardiology 2/5, PCP 3/20, AWV 5/21]  PCP/Specialist Visits Compliance with follow-up visit  Education Interventions   Education Provided Provided Education  Provided Verbal Education On When to see the doctor, Medication  [meds reviewed, advised to continue daily monitoring of blood pressure/heart rate]        Follow up plan: No further intervention  required.   Encounter Outcome:  Patient Visit Completed   Rodney Langton, RN, MSN, CCM [place]  [facility], [facility] RN Care Coordinator Direct Dial: [phone] / Main [phone] Fax [phone] Email: [email or web address]: [email or web address]

## 2023-05-04 NOTE — Progress Notes (Signed)
 [facility] [address], [place], [place] [address]  Phone: [phone] Fax: [phone]  Date of Service: 05/05/2023  Patient Clinic Note  PCP: Referring Provider:  Joshua Cathryne Rodney, MD [address] [place] [address] Phone: [phone] Fax: [phone] Pia Sharper, MD [id number] [place],  [place] [address] Phone: [phone] Fax: [phone]    Assessment and Plan:   [name] is a 68 y.o. female with past medical history of SVT s/p ablation ~ 2015, three-vessel CAD, hypertension, hyperlipidemia, RCA STEMI status post PCI 08/16/2022, previous tobacco use, who presents for cardiology evaluation.  Three-vessel coronary artery disease STEMI-RCA-status post 2 x drug-eluting stent 07/2022 Hyperlipidemia Intermittent dyspnea on exertion - Continue aspirin, Plavix.  Was on aspirin and Brilinta for 3 months.  However Brilinta was unaffordable for her. - Will transition to Plavix monotherapy at her next visit if she continues to do well  - Patient has unrevascularized LAD, D1, and circumflex stenoses.  No indication for revascularization of these lesions at this time. - Continue rosuvastatin , repeat lipids today - Due to intermittent lightheadedness we have discontinued metoprolol 25 mg daily - continue losartan  25 mg daily, blood pressure well-controlled   History of SVT status post ablation around 2015 - [facility]  - No signs or symptoms of recurrence per patient.  Continue to monitor.   Lab Results  Component Value Date   CHOL 158 08/16/2022   Lab Results  Component Value Date   HDL 63 (H) 08/16/2022   Lab Results  Component Value Date   LDL 83 08/16/2022   Lab Results  Component Value Date   VLDL 12.2 08/16/2022   Lab Results  Component Value Date   CHOLHDLRATIO 2.5 08/16/2022   Lab Results  Component Value Date   TRIG 61 08/16/2022    The 10-yr ASCVD Risk score Verdon DC Jr., et al., 2013) failed to calculate due  to the following reason: The 2013 10-yr ASCVD risk score is only valid if the patient does not have prior/existing clinical ASCVD (myocardial infarction, stroke, CABG, coronary angioplasty, angina or peripheral arterial disease, coronary atherosclerosis, ischemic heart disease, or cerebrovascular disease). The patient has prior/existing ASCVD. Had Heart Attack  Had Coronary Angioplasty   Lab Results  Component Value Date   A1C 5.6 08/16/2022     Return in about 6 months (around 11/02/2023).     Subjective:   Chief Complaint: Chief Complaint  Patient presents with  . Routine Follow-up     Referring Provider: Pia Sharper, MD  History of Present Illness:   Kirsten Moore is a 68 y.o. female, with past medical history of SVT s/p ablation ~ 2015, three-vessel CAD, hypertension, hyperlipidemia, RCA STEMI status post PCI 08/16/2022, previous tobacco use, who presents for cardiology evaluation.  Patient states she is doing well and has no acute complaints today.  Denies chest pain palpitations lightheadedness dizziness or shortness of breath with exertion.  ---presnting HPI -- Patient had about 24 hours of burning sensation in her throat/upper neck on 5/18, which persisted for 24 hours and thus she went to the ED 5/19.  She was found to have RCA STEMI, status post 2 drug-eluting stents to the RCA.  She had an echocardiogram done that admission which showed inferior lateral wall motion abnormalities, but normal LV and RV function.  She was started on aspirin, ticagrelor, rosuvastatin , metoprolol, low-dose losartan  and has been taking these since discharge. Since discharge she has not had recurrence of  her symptoms of indigestion/throat pain/dyspnea on exertion.  She has been compliant with her medications.  She has had 3 episodes of significant presyncope and severe dizziness since discharge.  She notes there is no exertional component to this, nothing that brings it on, and occurs out of  the blue.  She notes that she has become very close to losing consciousness during this, but denies loss of consciousness.  States this lasts for about a minute, and then she feels better.  Describes tunnel vision during this event.  She is also describing intermittent dyspnea that can occur at rest, typically in the morning after taking some of her medications.  She has been doing cardiac rehab at [facility], doing the treadmill, weights, and other different cardio machines.  She has no chest pain, no heartburn sensation, no significant dyspnea during these activities.  Symptoms prior to revasc - indigestion, throat pain, DOE. No chest pain. Sx were present for about  24 hours prior to presentation.     Cardiovascular History & Procedures: Cardiovascular Problems: Triple-vessel coronary artery disease RCA STEMI 07/2022 Hyperlipidemia Previous tobacco use history Significant family history of coronary artery disease  Cath / PCI: 08/27/2022-triple-vessel CAD with RCA has culprit lesion for STEMI status post PCI  CV Surgery: None  EP Procedures and Devices: SVT ablation - [facility] - ~ 2015   Non-Invasive Evaluation(s): independently reviewed the most recent study.  Echocardiogram 08/16/2022-normal biventricular systolic function, with basal inferior basal inferolateral and mid inferior lateral hypokinesis, aortic sclerosis Zio patch 09/2022-no significant arrhythmias or heart block Echocardiogram 10/05/2022-normal biventricular size and function.  No significant wall motion abnormalities.  Medical History: No past medical history on file.  Surgical History: Past Surgical History:  Procedure Laterality Date  . PR CATH PLACE/CORON ANGIO, IMG SUPER/INTERP,W LEFT HEART VENTRICULOGRAPHY N/A 08/16/2022   Procedure: Left Heart Catheterization With Intervention;  Surgeon: Barnet Donnice Righter, MD;  Location: [facility] CATH;  Service: Cardiology    Social History:  reports that she has quit smoking. Her  smoking use included cigarettes. She has been exposed to tobacco smoke. She has never used smokeless tobacco. She reports that she does not currently use alcohol. She reports that she does not use drugs. 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 Strong family hx of CAD , a fib   Review of Systems:  Except as noted in the HPI, the remainder of 10 systems reviewed is negative.   Allergies: Allergies  Allergen Reactions  . Ezetimibe  Other (See Comments)    Burning in abdomen  . Sulfa (Sulfonamide Antibiotics) Nausea And Vomiting    Medications:  Prior to Admission medications   Medication Dose, Route, Frequency  aspirin 81 MG chewable tablet 81 mg, Oral, Daily (standard)  clopidogrel (PLAVIX) 75 mg tablet 75 mg, Oral, Daily (standard)  rosuvastatin  (CRESTOR ) 20 MG tablet 20 mg, Oral, Daily (standard)  valACYclovir  (VALTREX ) 500 MG tablet 500 mg, 2 times a day (standard)  losartan  (COZAAR ) 25 MG tablet 25 mg, Oral, Daily (standard)     Objective:   Vitals BP 123/69 (BP Site: L Arm, BP Position: Sitting, BP Cuff Size: Medium)   Pulse 63   Wt 58.6 kg (129 lb 1.6 oz)   SpO2 95%   BMI 25.21 kg/m  Wt Readings from Last 3 Encounters:  05/05/23 58.6 kg (129 lb 1.6 oz)  11/02/22 59.7 kg (131 lb 9.6 oz)  09/30/22 59.5 kg (131 lb 1.6 oz)    Physical Exam General:  Pleasant female sitting in chair in nad.  Neck: Supple, JVP normal.  Resp:   CTAB bilaterally with normal WOB.  Cardio:  RRR without m/r/g.  Abdomen:   Soft, non-distended, non-tender.  Extremities: Warm well-perfused bilaterally. No edema .  MSK: No joint swelling or erythema. No gross deformities.  Skin: No rashes  Neuro: CN II-XII grossly intact.  Strength grossly intact.   Psych: Alert and oriented x3. Appropriate mood.    ECG (05/05/23) - independently interpreted.  None today.  Most recently 08/19/2022-independently interpreted-normal sinus rhythm, leftward axis deviation, Q waves present inferior leads, lateral precordial T wave inversion/flattening  Most Recent Labs  Lab Results  Component Value Date   NA 144 08/18/2022   K 3.5 08/18/2022   CL 110 (H) 08/18/2022   CO2 26.0 08/18/2022   Lab Results  Component Value Date   BUN 16 08/18/2022   BUN 12 08/17/2022   Lab Results  Component Value Date   Creatinine 0.79 08/18/2022   Creatinine 0.83 08/17/2022   No results found for: PROBNP Lab Results  Component Value Date   Cholesterol 158 08/16/2022   Triglycerides 61 08/16/2022   HDL 63 (H) 08/16/2022   Non-HDL Cholesterol 95 08/16/2022   LDL Calculated 83 08/16/2022

## 2023-05-05 DIAGNOSIS — I471 Supraventricular tachycardia, unspecified: Secondary | ICD-10-CM | POA: Diagnosis not present

## 2023-05-05 DIAGNOSIS — Z955 Presence of coronary angioplasty implant and graft: Secondary | ICD-10-CM | POA: Diagnosis not present

## 2023-05-05 DIAGNOSIS — I10 Essential (primary) hypertension: Secondary | ICD-10-CM | POA: Diagnosis not present

## 2023-05-05 DIAGNOSIS — E785 Hyperlipidemia, unspecified: Secondary | ICD-10-CM | POA: Diagnosis not present

## 2023-05-05 DIAGNOSIS — I2111 ST elevation (STEMI) myocardial infarction involving right coronary artery: Secondary | ICD-10-CM | POA: Diagnosis not present

## 2023-05-05 DIAGNOSIS — R9431 Abnormal electrocardiogram [ECG] [EKG]: Secondary | ICD-10-CM | POA: Diagnosis not present

## 2023-05-24 ENCOUNTER — Encounter: Payer: Self-pay | Admitting: Family Medicine

## 2023-05-24 ENCOUNTER — Ambulatory Visit (INDEPENDENT_AMBULATORY_CARE_PROVIDER_SITE_OTHER): Payer: Medicare HMO | Admitting: Family Medicine

## 2023-05-24 VITALS — BP 118/72 | HR 67 | Ht 60.0 in | Wt 131.2 lb

## 2023-05-24 DIAGNOSIS — J0100 Acute maxillary sinusitis, unspecified: Secondary | ICD-10-CM | POA: Diagnosis not present

## 2023-05-24 MED ORDER — CEFUROXIME AXETIL 250 MG PO TABS: 250.0000 mg | ORAL_TABLET | Freq: Two times a day (BID) | ORAL | 0 refills | Status: AC

## 2023-05-24 NOTE — Progress Notes (Signed)
 Date:  05/24/2023   Name:  Kirsten Moore   DOB:  [birth year]   MRN:  [id number]   Chief Complaint: Cough (Cough x 2 days, drainage ) and Headache (Frontal headache x 2 days)  Cough This is a new problem. The current episode started in the past 7 days. The problem has been waxing and waning. The problem occurs constantly. The cough is Productive of blood-tinged sputum (nasal discharge). Associated symptoms include headaches, nasal congestion, rhinorrhea, a sore throat and shortness of breath. Pertinent negatives include no chest pain, chills, ear congestion, ear pain, fever, hemoptysis, postnasal drip or wheezing. Nothing aggravates the symptoms. She has tried nothing for the symptoms.  Sinusitis This is a new problem. The current episode started in the past 7 days. The problem has been gradually improving since onset. There has been no fever. The pain is mild (frontal). Associated symptoms include congestion, coughing, diaphoresis, headaches, shortness of breath, sinus pressure and a sore throat. Pertinent negatives include no chills, ear pain, hoarse voice, neck pain, sneezing or swollen glands. Past treatments include acetaminophen. The treatment provided mild (relief headache) relief.    Lab Results  Component Value Date   NA 141 06/01/2022   K 4.3 06/01/2022   CO2 22 06/01/2022   GLUCOSE 88 06/01/2022   BUN 11 06/01/2022   CREATININE 0.71 06/01/2022   CALCIUM 9.2 06/01/2022   EGFR 93 06/01/2022   GFRNONAA 76 04/19/2019   Lab Results  Component Value Date   CHOL 272 (H) 06/01/2022   HDL 72 06/01/2022   LDLCALC 175 (H) 06/01/2022   TRIG 139 06/01/2022   CHOLHDL 3.5 04/04/2018   No results found for: "TSH" No results found for: "HGBA1C" Lab Results  Component Value Date   WBC 7.7 08/13/2021   HGB 14.1 08/13/2021   HCT 41.1 08/13/2021   MCV 97 08/13/2021   PLT 311 08/13/2021   Lab Results  Component Value Date   ALT 14 06/01/2022   AST 19 06/01/2022   ALKPHOS 106  06/01/2022   BILITOT 0.3 06/01/2022   No results found for: "25OHVITD2", "25OHVITD3", "VD25OH"   Review of Systems  Constitutional:  Positive for diaphoresis. Negative for chills, fatigue, fever and unexpected weight change.  HENT:  Positive for congestion, nosebleeds, rhinorrhea, sinus pressure, sinus pain and sore throat. Negative for dental problem, ear discharge, ear pain, hoarse voice, postnasal drip and sneezing.        Frontal sinus discomfort  Respiratory:  Positive for cough and shortness of breath. Negative for hemoptysis, choking, chest tightness, wheezing and stridor.   Cardiovascular:  Negative for chest pain, palpitations and leg swelling.  Musculoskeletal:  Negative for neck pain.  Neurological:  Positive for headaches.    Patient Active Problem List   Diagnosis Date Noted   AVNRT (AV nodal re-entry tachycardia) (HCC) 09/03/2015   Osteoporosis 01/22/2015    Allergies  Allergen Reactions   Sulfa Antibiotics Nausea And Vomiting   Zetia [Ezetimibe]     Burning in abdomen   Misc. Sulfonamide Containing Compounds Nausea And Vomiting    Past Surgical History:  Procedure Laterality Date   BREAST BIOPSY Left 08/05/2020   stereo bx, x clip, calcs, path pending.   BUNIONECTOMY Left    CARDIAC ELECTROPHYSIOLOGY MAPPING AND ABLATION     CARDIAC ELECTROPHYSIOLOGY STUDY AND ABLATION     COLONOSCOPY  2011   cleared for 10 yrs- [facility]   VAGINAL HYSTERECTOMY     partial    Social  History   Tobacco Use   Smoking status: Former    Current packs/day: 0.00    Average packs/day: 1 pack/day for 25.0 years (25.0 ttl pk-yrs)    Types: Cigarettes    Start date: 08/31/1977    Quit date: 09/01/2002    Years since quitting: 20.7   Smokeless tobacco: Never  Vaping Use   Vaping status: Never Used  Substance Use Topics   Alcohol use: No    Alcohol/week: 0.0 standard drinks of alcohol   Drug use: No     Medication list has been reviewed and updated.  Current Meds   Medication Sig   aspirin 81 MG chewable tablet Chew by mouth.   clopidogrel (PLAVIX) 75 MG tablet Take 75 mg by mouth daily.   losartan (COZAAR) 25 MG tablet Take 1 tablet (25 mg total) by mouth daily.   rosuvastatin (CRESTOR) 20 MG tablet Take 1 tablet by mouth daily.   valACYclovir (VALTREX) 500 MG tablet Take 1 tablet (500 mg total) by mouth 2 (two) times daily.       05/24/2023   11:03 AM 02/05/2023   11:04 AM 12/29/2022    9:29 AM 10/19/2022    3:03 PM  GAD 7 : Generalized Anxiety Score  Nervous, Anxious, on Edge 0 0 0 0  Control/stop worrying 0 0 0 0  Worry too much - different things 0 0 0 0  Trouble relaxing 0 0 0 0  Restless 0 0 0 0  Easily annoyed or irritable 0 0 0 0  Afraid - awful might happen 0 0 0 0  Total GAD 7 Score 0 0 0 0  Anxiety Difficulty Not difficult at all Not difficult at all Not difficult at all Not difficult at all       05/24/2023   11:03 AM 02/05/2023   11:04 AM 12/29/2022    9:29 AM  Depression screen PHQ 2/9  Decreased Interest 0 0 0  Down, Depressed, Hopeless 0 0 0  PHQ - 2 Score 0 0 0  Altered sleeping 0 0 0  Tired, decreased energy 0 0 0  Change in appetite 0 0 0  Feeling bad or failure about yourself  0 0 0  Trouble concentrating 0 0 0  Moving slowly or fidgety/restless 0 0 0  Suicidal thoughts 0 0 0  PHQ-9 Score 0 0 0  Difficult doing work/chores Not difficult at all Not difficult at all Not difficult at all    BP Readings from Last 3 Encounters:  05/24/23 118/72  02/05/23 126/78  12/29/22 134/82    Physical Exam Vitals and nursing note reviewed.  Constitutional:      General: She is not in acute distress.    Appearance: She is not diaphoretic.  HENT:     Head: Normocephalic and atraumatic.     Right Ear: Hearing, tympanic membrane, ear canal and external ear normal.     Left Ear: Hearing, tympanic membrane, ear canal and external ear normal.     Nose: Nasal tenderness and mucosal edema present.     Right Turbinates:  Swollen. Not pale.     Left Turbinates: Swollen and pale.     Right Sinus: Maxillary sinus tenderness and frontal sinus tenderness present.     Left Sinus: Maxillary sinus tenderness and frontal sinus tenderness present.     Mouth/Throat:     Pharynx: Oropharynx is clear. Uvula midline. Posterior oropharyngeal erythema present. No oropharyngeal exudate.  Eyes:     General:  Right eye: No discharge.        Left eye: No discharge.     Extraocular Movements: Extraocular movements intact.     Conjunctiva/sclera: Conjunctivae normal.     Pupils: Pupils are equal, round, and reactive to light.  Neck:     Thyroid: No thyromegaly.     Vascular: No JVD.  Cardiovascular:     Rate and Rhythm: Normal rate and regular rhythm.     Heart sounds: Normal heart sounds. No murmur heard.    No friction rub. No gallop.  Pulmonary:     Effort: Pulmonary effort is normal.     Breath sounds: Normal breath sounds.  Abdominal:     General: Bowel sounds are normal.     Palpations: Abdomen is soft. There is no mass.     Tenderness: There is no abdominal tenderness. There is no guarding.  Musculoskeletal:        General: Normal range of motion.     Cervical back: Normal range of motion and neck supple.  Lymphadenopathy:     Head:     Right side of head: Submandibular adenopathy present.     Left side of head: Submandibular adenopathy present.     Cervical: No cervical adenopathy.     Right cervical: No superficial, deep or posterior cervical adenopathy.    Left cervical: No superficial, deep or posterior cervical adenopathy.     Comments: tenderness  Skin:    General: Skin is warm and dry.  Neurological:     Mental Status: She is alert.     Deep Tendon Reflexes: Reflexes are normal and symmetric.     Wt Readings from Last 3 Encounters:  05/24/23 131 lb 3.2 oz (59.5 kg)  02/05/23 129 lb (58.5 kg)  12/29/22 130 lb 9.6 oz (59.2 kg)    BP 118/72   Pulse 67   Ht 5' (1.524 m)   Wt 131 lb  3.2 oz (59.5 kg)   SpO2 95%   BMI 25.62 kg/m   Assessment and Plan:  1. Acute maxillary sinusitis, recurrence not specified (Primary) Acute.  Persistent.  Stable.  Patient has had sore throat with discomfort of the frontal and maxillary sinus areas described as pressure..  There is tenderness noted over these areas and patient has had some blood-tinged mucus per nasal discharge.  There is some tenderness of the submandibular nodes bilateral consistent with possible sinus concern and we will treat for sinusitis with Ceftin to 50 mg twice a day for 10 days. - cefUROXime (CEFTIN) 250 MG tablet; Take 1 tablet (250 mg total) by mouth 2 (two) times daily with a meal for 10 days.  Dispense: 20 tablet; Refill: 0    Elizabeth Sauer, MD

## 2023-06-04 DIAGNOSIS — H2513 Age-related nuclear cataract, bilateral: Secondary | ICD-10-CM | POA: Diagnosis not present

## 2023-06-04 DIAGNOSIS — H518 Other specified disorders of binocular movement: Secondary | ICD-10-CM | POA: Diagnosis not present

## 2023-06-04 DIAGNOSIS — H18413 Arcus senilis, bilateral: Secondary | ICD-10-CM | POA: Diagnosis not present

## 2023-06-17 ENCOUNTER — Encounter: Payer: Self-pay | Admitting: Family Medicine

## 2023-06-17 ENCOUNTER — Ambulatory Visit (INDEPENDENT_AMBULATORY_CARE_PROVIDER_SITE_OTHER): Payer: Medicare HMO | Admitting: Family Medicine

## 2023-06-17 VITALS — BP 120/78 | HR 56 | Ht 60.0 in | Wt 130.0 lb

## 2023-06-17 DIAGNOSIS — J309 Allergic rhinitis, unspecified: Secondary | ICD-10-CM | POA: Diagnosis not present

## 2023-06-17 DIAGNOSIS — I10 Essential (primary) hypertension: Secondary | ICD-10-CM

## 2023-06-17 DIAGNOSIS — A6004 Herpesviral vulvovaginitis: Secondary | ICD-10-CM | POA: Diagnosis not present

## 2023-06-17 DIAGNOSIS — I2510 Atherosclerotic heart disease of native coronary artery without angina pectoris: Secondary | ICD-10-CM | POA: Diagnosis not present

## 2023-06-17 MED ORDER — MONTELUKAST SODIUM 10 MG PO TABS: 10.0000 mg | ORAL_TABLET | Freq: Every day | ORAL | 3 refills | Status: DC

## 2023-06-17 MED ORDER — VALACYCLOVIR HCL 500 MG PO TABS: 500.0000 mg | ORAL_TABLET | Freq: Two times a day (BID) | ORAL | 6 refills | Status: DC

## 2023-06-17 MED ORDER — LOSARTAN POTASSIUM 25 MG PO TABS: 25.0000 mg | ORAL_TABLET | Freq: Every day | ORAL | 1 refills | Status: DC

## 2023-06-17 NOTE — Progress Notes (Signed)
 Date:  06/17/2023   Name:  Kirsten Moore   DOB:  31-Jan-1956   MRN:  [id number]   Chief Complaint: Hypertension  Hypertension This is a chronic problem. The current episode started more than 1 year ago. The problem has been gradually improving since onset. The problem is controlled. Pertinent negatives include no chest pain, headaches, neck pain, orthopnea, palpitations, PND, shortness of breath or sweats. Agents associated with hypertension include thyroid hormones. Past treatments include angiotensin blockers. The current treatment provides moderate improvement. There are no compliance problems.  Hypertensive end-organ damage includes CAD/MI. There is no history of CVA. There is no history of chronic renal disease, a hypertension causing med or renovascular disease.  Sinus Problem This is a new problem. The current episode started 1 to 4 weeks ago. The problem has been waxing and waning since onset. There has been no fever. Her pain is at a severity of 4/10. The pain is mild (facial pressure). Associated symptoms include congestion, coughing and sinus pressure. Pertinent negatives include no chills, diaphoresis, ear pain, headaches, hoarse voice, neck pain, shortness of breath, sneezing, sore throat or swollen glands. Past treatments include antibiotics.    Lab Results  Component Value Date   NA 141 06/01/2022   K 4.3 06/01/2022   CO2 22 06/01/2022   GLUCOSE 88 06/01/2022   BUN 11 06/01/2022   CREATININE 0.71 06/01/2022   CALCIUM 9.2 06/01/2022   EGFR 93 06/01/2022   GFRNONAA 76 04/19/2019   Lab Results  Component Value Date   CHOL 272 (H) 06/01/2022   HDL 72 06/01/2022   LDLCALC 175 (H) 06/01/2022   TRIG 139 06/01/2022   CHOLHDL 3.5 04/04/2018   No results found for: "TSH" No results found for: "HGBA1C" Lab Results  Component Value Date   WBC 7.7 08/13/2021   HGB 14.1 08/13/2021   HCT 41.1 08/13/2021   MCV 97 08/13/2021   PLT 311 08/13/2021   Lab Results   Component Value Date   ALT 14 06/01/2022   AST 19 06/01/2022   ALKPHOS 106 06/01/2022   BILITOT 0.3 06/01/2022   No results found for: "25OHVITD2", "25OHVITD3", "VD25OH"   Review of Systems  Constitutional:  Negative for chills, diaphoresis and fever.  HENT:  Positive for congestion and sinus pressure. Negative for ear pain, hoarse voice, sneezing and sore throat.   Eyes:  Negative for visual disturbance.  Respiratory:  Positive for cough. Negative for chest tightness, shortness of breath, wheezing and stridor.   Cardiovascular:  Negative for chest pain, palpitations, orthopnea, leg swelling and PND.  Endocrine: Negative for polyuria.  Genitourinary:  Negative for difficulty urinating.  Musculoskeletal:  Negative for neck pain.  Neurological:  Negative for headaches.    Patient Active Problem List   Diagnosis Date Noted   AVNRT (AV nodal re-entry tachycardia) (HCC) 09/03/2015   Osteoporosis 01/22/2015    Allergies  Allergen Reactions   Sulfa Antibiotics Nausea And Vomiting   Zetia [Ezetimibe]     Burning in abdomen   Misc. Sulfonamide Containing Compounds Nausea And Vomiting    Past Surgical History:  Procedure Laterality Date   BREAST BIOPSY Left 08/05/2020   stereo bx, x clip, calcs, path pending.   BUNIONECTOMY Left    CARDIAC ELECTROPHYSIOLOGY MAPPING AND ABLATION     CARDIAC ELECTROPHYSIOLOGY STUDY AND ABLATION     COLONOSCOPY  2011   cleared for 10 yrs- [facility]   VAGINAL HYSTERECTOMY     partial    Social  History   Tobacco Use   Smoking status: Former    Current packs/day: 0.00    Average packs/day: 1 pack/day for 25.0 years (25.0 ttl pk-yrs)    Types: Cigarettes    Start date: 08/31/1977    Quit date: 09/01/2002    Years since quitting: 20.8   Smokeless tobacco: Never  Vaping Use   Vaping status: Never Used  Substance Use Topics   Alcohol use: No    Alcohol/week: 0.0 standard drinks of alcohol   Drug use: No     Medication list has been reviewed  and updated.  Current Meds  Medication Sig   aspirin 81 MG chewable tablet Chew by mouth.   clopidogrel (PLAVIX) 75 MG tablet Take 75 mg by mouth daily.   losartan (COZAAR) 25 MG tablet Take 1 tablet (25 mg total) by mouth daily.   rosuvastatin (CRESTOR) 20 MG tablet Take 1 tablet by mouth daily.   valACYclovir (VALTREX) 500 MG tablet Take 1 tablet (500 mg total) by mouth 2 (two) times daily.       06/17/2023    9:30 AM 05/24/2023   11:03 AM 02/05/2023   11:04 AM 12/29/2022    9:29 AM  GAD 7 : Generalized Anxiety Score  Nervous, Anxious, on Edge 0 0 0 0  Control/stop worrying 0 0 0 0  Worry too much - different things 0 0 0 0  Trouble relaxing 0 0 0 0  Restless 0 0 0 0  Easily annoyed or irritable 0 0 0 0  Afraid - awful might happen 0 0 0 0  Total GAD 7 Score 0 0 0 0  Anxiety Difficulty Not difficult at all Not difficult at all Not difficult at all Not difficult at all       06/17/2023    9:30 AM 05/24/2023   11:03 AM 02/05/2023   11:04 AM  Depression screen PHQ 2/9  Decreased Interest 0 0 0  Down, Depressed, Hopeless 0 0 0  PHQ - 2 Score 0 0 0  Altered sleeping  0 0  Tired, decreased energy  0 0  Change in appetite  0 0  Feeling bad or failure about yourself   0 0  Trouble concentrating  0 0  Moving slowly or fidgety/restless  0 0  Suicidal thoughts  0 0  PHQ-9 Score  0 0  Difficult doing work/chores  Not difficult at all Not difficult at all    BP Readings from Last 3 Encounters:  06/17/23 120/78  05/24/23 118/72  02/05/23 126/78    Physical Exam Vitals and nursing note reviewed.  Constitutional:      General: She is not in acute distress.    Appearance: She is not diaphoretic.  HENT:     Head: Normocephalic and atraumatic.     Right Ear: Tympanic membrane and external ear normal.     Left Ear: Tympanic membrane and external ear normal.     Nose: Nose normal.     Mouth/Throat:     Mouth: Mucous membranes are moist.  Eyes:     General:        Right eye:  No discharge.        Left eye: No discharge.     Conjunctiva/sclera: Conjunctivae normal.     Pupils: Pupils are equal, round, and reactive to light.  Neck:     Thyroid: No thyromegaly.     Vascular: No JVD.  Cardiovascular:     Rate and Rhythm: Normal  rate and regular rhythm.     Heart sounds: Normal heart sounds. No murmur heard.    No friction rub. No gallop.  Pulmonary:     Effort: Pulmonary effort is normal.     Breath sounds: Normal breath sounds. No wheezing, rhonchi or rales.  Abdominal:     General: Bowel sounds are normal.     Palpations: Abdomen is soft. There is no mass.     Tenderness: There is no abdominal tenderness. There is no guarding.  Musculoskeletal:        General: Normal range of motion.     Cervical back: Normal range of motion and neck supple.  Lymphadenopathy:     Cervical: No cervical adenopathy.  Skin:    General: Skin is warm and dry.  Neurological:     Mental Status: She is alert.     Deep Tendon Reflexes: Reflexes are normal and symmetric.     Wt Readings from Last 3 Encounters:  06/17/23 130 lb (59 kg)  05/24/23 131 lb 3.2 oz (59.5 kg)  02/05/23 129 lb (58.5 kg)    BP 120/78   Pulse (!) 56   Ht 5' (1.524 m)   Wt 130 lb (59 kg)   SpO2 98%   BMI 25.39 kg/m   Assessment and Plan: 1. Coronary artery disease involving native coronary artery of native heart without angina pectoris Chronic.  Controlled.  Patient doing well and is followed by Dr. [place] at [facility].  2. Primary hypertension Chronic.  Controlled.  Stable.  Blood pressure today is 120/78.  Asymptomatic.  Tolerating medications well.  Continue losartan 25 mg once a day.  Will recheck in 6 months. - losartan (COZAAR) 25 MG tablet; Take 1 tablet (25 mg total) by mouth daily.  Dispense: 90 tablet; Refill: 1  3. Recurrent vulvovaginal herpes simplex Chronic.  Controlled.  Stable.  Controlled with bowels tracts 500 mg twice a day as needed. - valACYclovir (VALTREX) 500 MG tablet; Take 1  tablet (500 mg total) by mouth 2 (two) times daily.  Dispense: 30 tablet; Refill: 6  4. Allergic sinusitis (Primary) Chronic.  Controlled.  Stable.  Patient does not wish to use any nasal steroid or saline lavage.  So we will rely mostly on montelukast and Mucinex for control of sinuses of allergic etiology. - montelukast (SINGULAIR) 10 MG tablet; Take 1 tablet (10 mg total) by mouth at bedtime.  Dispense: 30 tablet; Refill: 3     Elizabeth Sauer, MD

## 2023-08-10 ENCOUNTER — Encounter: Payer: Self-pay | Admitting: Emergency Medicine

## 2023-08-18 ENCOUNTER — Ambulatory Visit: Payer: Medicare HMO

## 2023-09-13 ENCOUNTER — Other Ambulatory Visit: Payer: Self-pay

## 2023-09-13 DIAGNOSIS — J309 Allergic rhinitis, unspecified: Secondary | ICD-10-CM

## 2023-09-13 MED ORDER — MONTELUKAST SODIUM 10 MG PO TABS: 10.0000 mg | ORAL_TABLET | Freq: Every day | ORAL | 0 refills | Status: DC

## 2023-10-25 ENCOUNTER — Telehealth: Payer: Self-pay | Admitting: Family Medicine

## 2023-10-25 ENCOUNTER — Other Ambulatory Visit: Payer: Self-pay

## 2023-10-25 DIAGNOSIS — A6004 Herpesviral vulvovaginitis: Secondary | ICD-10-CM

## 2023-10-25 MED ORDER — VALACYCLOVIR HCL 500 MG PO TABS: 500.0000 mg | ORAL_TABLET | Freq: Two times a day (BID) | ORAL | 0 refills | Status: DC

## 2023-10-25 NOTE — Telephone Encounter (Signed)
 Refill denied.  Below are notes from Dr. Lemon regarding medication.  Based on OV notes from 3/20 with Dr. Joshua this is only a as needed medication.  Reasons for daily medication would be having more than 6 episodes of herpes flares in a year, or having immunocompromising condition like HIV  If she is not currently having a flare would have her only take medication as needed and we can discuss this further at her upcoming appointment

## 2023-10-25 NOTE — Telephone Encounter (Signed)
 Copied from CRM [id number]. Topic: Clinical - Prescription Issue >> Oct 25, 2023 11:09 AM Leonette SQUIBB wrote: Reason for CRM: pt called saying the pharmacy told her she needed a new prescription for Valtrex  but pt is not out right now and she had 6 refills per chart.  She is also asking since her provider Dr. Thyra has retired who will be her new provider.  Please call her at [phone]

## 2023-10-25 NOTE — Telephone Encounter (Signed)
 Please call pt to sched with new provider. We can discuss medicine refills at this appt.  CM

## 2023-10-25 NOTE — Addendum Note (Signed)
 Addended by: CAMACHO OCAMPO, Asencion Guisinger M on: 10/25/2023 04:26 PM   Modules accepted: Orders

## 2023-11-02 DIAGNOSIS — R9431 Abnormal electrocardiogram [ECG] [EKG]: Secondary | ICD-10-CM | POA: Diagnosis not present

## 2023-11-02 DIAGNOSIS — R42 Dizziness and giddiness: Secondary | ICD-10-CM | POA: Diagnosis not present

## 2023-11-02 DIAGNOSIS — I471 Supraventricular tachycardia, unspecified: Secondary | ICD-10-CM | POA: Diagnosis not present

## 2023-11-02 DIAGNOSIS — Z955 Presence of coronary angioplasty implant and graft: Secondary | ICD-10-CM | POA: Diagnosis not present

## 2023-11-02 DIAGNOSIS — I2111 ST elevation (STEMI) myocardial infarction involving right coronary artery: Secondary | ICD-10-CM | POA: Diagnosis not present

## 2023-11-02 DIAGNOSIS — R55 Syncope and collapse: Secondary | ICD-10-CM | POA: Diagnosis not present

## 2023-11-02 DIAGNOSIS — E785 Hyperlipidemia, unspecified: Secondary | ICD-10-CM | POA: Diagnosis not present

## 2023-11-02 DIAGNOSIS — I10 Essential (primary) hypertension: Secondary | ICD-10-CM | POA: Diagnosis not present

## 2023-11-08 DIAGNOSIS — I471 Supraventricular tachycardia, unspecified: Secondary | ICD-10-CM | POA: Diagnosis not present

## 2023-11-08 DIAGNOSIS — R9431 Abnormal electrocardiogram [ECG] [EKG]: Secondary | ICD-10-CM | POA: Diagnosis not present

## 2023-11-08 DIAGNOSIS — I10 Essential (primary) hypertension: Secondary | ICD-10-CM | POA: Diagnosis not present

## 2023-11-08 DIAGNOSIS — E785 Hyperlipidemia, unspecified: Secondary | ICD-10-CM | POA: Diagnosis not present

## 2023-11-08 DIAGNOSIS — I2111 ST elevation (STEMI) myocardial infarction involving right coronary artery: Secondary | ICD-10-CM | POA: Diagnosis not present

## 2023-11-08 DIAGNOSIS — Z955 Presence of coronary angioplasty implant and graft: Secondary | ICD-10-CM | POA: Diagnosis not present

## 2023-11-16 ENCOUNTER — Telehealth: Payer: Self-pay

## 2023-11-16 ENCOUNTER — Ambulatory Visit (INDEPENDENT_AMBULATORY_CARE_PROVIDER_SITE_OTHER): Admitting: Student

## 2023-11-16 ENCOUNTER — Encounter: Payer: Self-pay | Admitting: Student

## 2023-11-16 VITALS — BP 118/74 | HR 97 | Ht 60.0 in | Wt 134.0 lb

## 2023-11-16 DIAGNOSIS — I2510 Atherosclerotic heart disease of native coronary artery without angina pectoris: Secondary | ICD-10-CM | POA: Insufficient documentation

## 2023-11-16 DIAGNOSIS — N958 Other specified menopausal and perimenopausal disorders: Secondary | ICD-10-CM | POA: Insufficient documentation

## 2023-11-16 DIAGNOSIS — Z1231 Encounter for screening mammogram for malignant neoplasm of breast: Secondary | ICD-10-CM | POA: Diagnosis not present

## 2023-11-16 DIAGNOSIS — M816 Localized osteoporosis [Lequesne]: Secondary | ICD-10-CM

## 2023-11-16 DIAGNOSIS — I471 Supraventricular tachycardia, unspecified: Secondary | ICD-10-CM | POA: Diagnosis not present

## 2023-11-16 DIAGNOSIS — A6004 Herpesviral vulvovaginitis: Secondary | ICD-10-CM | POA: Diagnosis not present

## 2023-11-16 MED ORDER — ESTRADIOL 0.1 MG/GM VA CREA: 1.0000 | TOPICAL_CREAM | Freq: Every day | VAGINAL | 12 refills | Status: AC

## 2023-11-16 MED ORDER — ESTROGENS CONJUGATED 0.625 MG/GM VA CREA: 1.0000 | TOPICAL_CREAM | Freq: Every day | VAGINAL | 1 refills | Status: DC

## 2023-11-16 NOTE — Assessment & Plan Note (Signed)
 Reports labial discomfort off and on every few months. Reports this feels different from genital herpes lesions, no itching, drainage or bleeding, or dysuria. Exam c/w vaginal atrophy will trial topical estrogen cream.

## 2023-11-16 NOTE — Assessment & Plan Note (Signed)
 Managed by cardiology, unable to tolerate BB due to dizziness. Is current completed home cardiac monitor.

## 2023-11-16 NOTE — Telephone Encounter (Signed)
Spoke with patient, she verbalized understanding.

## 2023-11-16 NOTE — Assessment & Plan Note (Addendum)
 Previously on DAPT, recently stopped now on plavix alone. Rosuvastatin  increased from 20mg  to 40 mg about 2 weeks ago after last cardiology appointment. Last LDL was 68 on 05/05/2023. She will have repeat lipid panel with cardiology. Tolerating losartan  25 mg daily.

## 2023-11-16 NOTE — Assessment & Plan Note (Addendum)
 On suppressive therapy with valacyclovir  500 mg twice daily, no active lesions today. BMP today.

## 2023-11-16 NOTE — Telephone Encounter (Signed)
 Copied from CRM [id number]. Topic: Clinical - Medication Question >> Nov 16, 2023 12:07 PM Roselie BROCKS wrote: Reason for CRM: Patient is requesting a call back to speak with someone about 2 Vaginal creams she said the pharmacy  has for her.  estradiol  (ESTRACE ) 0.1 MG/GM vaginal cream, conjugated estrogens  (PREMARIN ) vaginal

## 2023-11-16 NOTE — Progress Notes (Signed)
 Established Patient Office Visit  Subjective   Patient ID: Kirsten Moore, female    DOB: [birth year]  Age: 68 y.o. MRN: [id number]  Chief Complaint  Patient presents with   Transitions Of Care    Patient is here today to transition of care from Dr. Joshua to new pcp    Kirsten Moore with medical hx listed below presents today for transfer of care. Previously seeing Dr. Joshua who recently retired. Please refer to problem based charting for further details and assessment and plan of current problem and chronic medical conditions.   Patient Active Problem List   Diagnosis Date Noted   CAD (coronary artery disease) 11/16/2023   Genitourinary syndrome of menopause 11/16/2023   SVT (supraventricular tachycardia) (HCC) 11/02/2022   HLD (hyperlipidemia) 08/16/2022   Recurrent vulvovaginal herpes simplex 08/16/2022   S/P drug eluting coronary stent placement 08/16/2022   STEMI involving right coronary artery (HCC) 08/16/2022   AVNRT (AV nodal re-entry tachycardia) (HCC) 09/03/2015   Osteoporosis 01/22/2015      ROS Refer to HPI    Objective:     Outpatient Encounter Medications as of 11/16/2023  Medication Sig   clopidogrel (PLAVIX) 75 MG tablet Take 75 mg by mouth daily.   estradiol  (ESTRACE ) 0.1 MG/GM vaginal cream Place 1 Applicatorful vaginally at bedtime.   losartan  (COZAAR ) 25 MG tablet Take 1 tablet (25 mg total) by mouth daily.   rosuvastatin  (CRESTOR ) 40 MG tablet Take 40 mg by mouth daily.   valACYclovir  (VALTREX ) 500 MG tablet Take 1 tablet (500 mg total) by mouth 2 (two) times daily.   [DISCONTINUED] conjugated estrogens  (PREMARIN ) vaginal cream Place 1 Applicatorful vaginally daily.   [DISCONTINUED] aspirin 81 MG chewable tablet Chew by mouth. (Patient not taking: Reported on 11/16/2023)   [DISCONTINUED] montelukast  (SINGULAIR ) 10 MG tablet Take 1 tablet (10 mg total) by mouth at bedtime. (Patient not taking: Reported on 11/16/2023)   [DISCONTINUED]  rosuvastatin  (CRESTOR ) 20 MG tablet Take 1 tablet by mouth daily. (Patient not taking: Reported on 11/16/2023)   No facility-administered encounter medications on file as of 11/16/2023.    BP 118/74   Pulse 97   Ht 5' (1.524 m)   Wt 134 lb (60.8 kg)   SpO2 96%   BMI 26.17 kg/m  BP Readings from Last 3 Encounters:  11/16/23 118/74  06/17/23 120/78  05/24/23 118/72    Physical Exam Chaperone present: patient declined chaperone.  Constitutional:      General: She is not in acute distress.    Appearance: Normal appearance.  HENT:     Mouth/Throat:     Mouth: Mucous membranes are moist.     Pharynx: Oropharynx is clear.  Cardiovascular:     Rate and Rhythm: Normal rate and regular rhythm.     Pulses: Normal pulses.     Heart sounds: No murmur heard. Pulmonary:     Effort: Pulmonary effort is normal.     Breath sounds: No rhonchi or rales.  Abdominal:     General: Abdomen is flat. Bowel sounds are normal. There is no distension.     Palpations: Abdomen is soft.     Tenderness: There is no abdominal tenderness.  Genitourinary:    Pubic Area: No rash.      Comments: Few erosions of the labia minora on mucosal areas, adhesions of the labia, vaginal atrophy, no vesicles, no vaginal drainage or bleeding.  Musculoskeletal:        General: Normal range of motion.  Right lower leg: No edema.     Left lower leg: No edema.  Skin:    General: Skin is warm and dry.     Capillary Refill: Capillary refill takes less than 2 seconds.  Neurological:     General: No focal deficit present.     Mental Status: She is alert and oriented to person, place, and time.  Psychiatric:        Mood and Affect: Mood normal.        Behavior: Behavior normal.        11/16/2023    9:05 AM 06/17/2023    9:30 AM 05/24/2023   11:03 AM  Depression screen PHQ 2/9  Decreased Interest 0 0 0  Down, Depressed, Hopeless 0 0 0  PHQ - 2 Score 0 0 0  Altered sleeping 0  0  Tired, decreased energy 0  0   Change in appetite 0  0  Feeling bad or failure about yourself  0  0  Trouble concentrating 0  0  Moving slowly or fidgety/restless 0  0  Suicidal thoughts 0  0  PHQ-9 Score 0  0  Difficult doing work/chores Not difficult at all  Not difficult at all       11/16/2023    9:05 AM 06/17/2023    9:30 AM 05/24/2023   11:03 AM 02/05/2023   11:04 AM  GAD 7 : Generalized Anxiety Score  Nervous, Anxious, on Edge 0 0 0 0  Control/stop worrying 0 0 0 0  Worry too much - different things 0 0 0 0  Trouble relaxing 0 0 0 0  Restless 0 0 0 0  Easily annoyed or irritable 0 0 0 0  Afraid - awful might happen 0 0 0 0  Total GAD 7 Score 0 0 0 0  Anxiety Difficulty Not difficult at all Not difficult at all Not difficult at all Not difficult at all    No results found for any visits on 11/16/23.  Last CBC Lab Results  Component Value Date   WBC 7.7 08/13/2021   HGB 14.1 08/13/2021   HCT 41.1 08/13/2021   MCV 97 08/13/2021   MCH 33.2 (H) 08/13/2021   RDW 13.1 08/13/2021   PLT 311 08/13/2021   Last metabolic panel Lab Results  Component Value Date   GLUCOSE 88 06/01/2022   NA 141 06/01/2022   K 4.3 06/01/2022   CL 105 06/01/2022   CO2 22 06/01/2022   BUN 11 06/01/2022   CREATININE 0.71 06/01/2022   EGFR 93 06/01/2022   CALCIUM  9.2 06/01/2022   PHOS 3.8 06/12/2020   PROT 6.5 06/01/2022   ALBUMIN 4.4 06/01/2022   LABGLOB 2.1 06/01/2022   AGRATIO 2.1 06/01/2022   BILITOT 0.3 06/01/2022   ALKPHOS 106 06/01/2022   AST 19 06/01/2022   ALT 14 06/01/2022   ANIONGAP 6 01/14/2015   Last lipids Lab Results  Component Value Date   CHOL 272 (H) 06/01/2022   HDL 72 06/01/2022   LDLCALC 175 (H) 06/01/2022   TRIG 139 06/01/2022   CHOLHDL 3.5 04/04/2018     The ASCVD Risk score (Arnett DK, et al., 2019) failed to calculate for the following reasons:   Risk score cannot be calculated because patient has a medical history suggesting prior/existing ASCVD    Assessment & Plan:   Localized osteoporosis without current pathological fracture Assessment & Plan: BMD last done in 2016 showed osteoporosis. No history of pathologic fracture.  she declined oral  bisphosphate therapy due to difficulty swallowing large pills. Does not want to pursue infusion therapy due to cost. Calcium  levels are normal. Will check vitamin D , she supplements sporadically. Discussed weight bearing exercises. Declined repeat BMD today. Will revisit this at next visit.   Orders: -     VITAMIN D  25 Hydroxy (Vit-D Deficiency, Fractures)  Recurrent vulvovaginal herpes simplex Assessment & Plan: On suppressive therapy with valacyclovir  500 mg twice daily, no active lesions today. BMP today.   Orders: -     Basic metabolic panel with GFR  Screening mammogram for breast cancer -     3D Screening Mammogram, Left and Right  SVT (supraventricular tachycardia) (HCC) Assessment & Plan: Managed by cardiology, unable to tolerate BB due to dizziness. Is current completed home cardiac monitor.    Coronary artery disease involving native coronary artery of native heart without angina pectoris Assessment & Plan: Previously on DAPT, recently stopped now on plavix alone. Rosuvastatin  increased from 20mg  to 40 mg about 2 weeks ago after last cardiology appointment. Last LDL was 68 on 05/05/2023. She will have repeat lipid panel with cardiology. Tolerating losartan  25 mg daily.    Genitourinary syndrome of menopause Assessment & Plan: Reports labial discomfort off and on every few months. Reports this feels different from genital herpes lesions, no itching, drainage or bleeding, or dysuria. Exam c/w vaginal atrophy will trial topical estrogen cream.    Other orders -     Estradiol ; Place 1 Applicatorful vaginally at bedtime.  Dispense: 42.5 g; Refill: 12     Return in about 6 months (around 05/18/2024) for physical.    Harlene Saddler, MD

## 2023-11-16 NOTE — Patient Instructions (Addendum)
 Please call to schedule your mammogram at [phone]   Apply vaginal cream once daily for 2 weeks then decrease to every other day, please follow up if this is not helping with pain   Look at the below link for exercises to help with osteoporsis [email or web address]

## 2023-11-16 NOTE — Assessment & Plan Note (Addendum)
 BMD last done in 2016 showed osteoporosis. No history of pathologic fracture.  she declined oral bisphosphate therapy due to difficulty swallowing large pills. Does not want to pursue infusion therapy due to cost. Calcium  levels are normal. Will check vitamin D , she supplements sporadically. Discussed weight bearing exercises. Declined repeat BMD today. Will revisit this at next visit.

## 2023-11-17 ENCOUNTER — Ambulatory Visit: Payer: Self-pay | Admitting: Student

## 2023-11-17 LAB — BASIC METABOLIC PANEL WITH GFR
BUN/Creatinine Ratio: 16 (ref 12–28)
BUN: 14 mg/dL (ref 8–27)
CO2: 21 mmol/L (ref 20–29)
Calcium: 9.4 mg/dL (ref 8.7–10.3)
Chloride: 104 mmol/L (ref 96–106)
Creatinine, Ser: 0.87 mg/dL (ref 0.57–1.00)
Glucose: 147 mg/dL — ABNORMAL HIGH (ref 70–99)
Potassium: 4.6 mmol/L (ref 3.5–5.2)
Sodium: 140 mmol/L (ref 134–144)
eGFR: 72 mL/min/1.73 (ref >59)

## 2023-11-17 LAB — VITAMIN D 25 HYDROXY (VIT D DEFICIENCY, FRACTURES): Vit D, 25-Hydroxy: 21.5 ng/mL — ABNORMAL LOW (ref 30.0–100.0)

## 2023-11-22 ENCOUNTER — Other Ambulatory Visit: Payer: Self-pay | Admitting: Student

## 2023-11-22 ENCOUNTER — Ambulatory Visit: Payer: Self-pay

## 2023-11-22 ENCOUNTER — Encounter: Payer: Self-pay | Admitting: Student

## 2023-11-22 DIAGNOSIS — A6004 Herpesviral vulvovaginitis: Secondary | ICD-10-CM

## 2023-11-22 MED ORDER — VALACYCLOVIR HCL 500 MG PO TABS: 500.0000 mg | ORAL_TABLET | Freq: Two times a day (BID) | ORAL | 0 refills | Status: DC

## 2023-11-22 MED ORDER — HYDROCORTISONE ACETATE 25 MG RE SUPP: 25.0000 mg | Freq: Two times a day (BID) | RECTAL | 0 refills | Status: AC

## 2023-11-22 NOTE — Telephone Encounter (Signed)
 Please review and advise.

## 2023-11-22 NOTE — Telephone Encounter (Signed)
 Spoke with patient, she stated that her hemorrhoids are red, swollen, and tender. Had hemorrhoids years ago and tired suppositories, would like to try them again. Currently using preparation H but is not helping.    Patient would also like a refill on Valtrex  medication.

## 2023-11-22 NOTE — Telephone Encounter (Signed)
 FYI Only or Action Required?: Action required by provider: request medication for hemorrhoids.  Patient was last seen in primary care on 11/16/2023 by Lemon Raisin, MD.       Copied from CRM [id number]. Topic: Clinical - Medication Question >> Nov 22, 2023  8:05 AM Montie POUR wrote: Reason for CRM:  She wants to know if Dr. Lemon will call her something for hemorrhoids. Over the counter medications is not helping. Please call her to discuss at [phone],   Attempt made to reach out to patient: no answer: left voicemail. Will also forward to PCP office.

## 2023-11-22 NOTE — Telephone Encounter (Unsigned)
 Copied from CRM [id number]. Topic: Clinical - Medication Refill >> Nov 22, 2023  8:03 AM Cynthia K wrote: Medication: valACYclovir  (VALTREX ) 500 MG tablet  Has the patient contacted their pharmacy? Yes (Agent: If no, request that the patient contact the pharmacy for the refill. If patient does not wish to contact the pharmacy document the reason why and proceed with request.) (Agent: If yes, when and what did the pharmacy advise?) Pharmacy needs order to refill  This is the patient's preferred pharmacy:  [id number] [place], [place] - [address] [place] [address] Phone: [phone] Fax: [phone]  Is this the correct pharmacy for this prescription? Yes If no, delete pharmacy and type the correct one.   Has the prescription been filled recently? Yes  Is the patient out of the medication? Yes  Has the patient been seen for an appointment in the last year OR does the patient have an upcoming appointment? Yes  Can we respond through MyChart? Yes  Agent: Please be advised that Rx refills may take up to 3 business days. We ask that you follow-up with your pharmacy.

## 2023-11-23 DIAGNOSIS — I2111 ST elevation (STEMI) myocardial infarction involving right coronary artery: Secondary | ICD-10-CM | POA: Diagnosis not present

## 2023-11-23 DIAGNOSIS — R9431 Abnormal electrocardiogram [ECG] [EKG]: Secondary | ICD-10-CM | POA: Diagnosis not present

## 2023-11-23 NOTE — Telephone Encounter (Signed)
 Called patient to discuss instructions per Dr. Lemon, there was no answer. No voicemail message left

## 2023-11-23 NOTE — Telephone Encounter (Signed)
 Duplicate request.E-Prescribing Status: Receipt confirmed by pharmacy (11/22/2023  4:39 PM EDT).  Requested Prescriptions  Pending Prescriptions Disp Refills   valACYclovir  (VALTREX ) 500 MG tablet 30 tablet 0    Sig: Take 1 tablet (500 mg total) by mouth 2 (two) times daily.     Antimicrobials:  Antiviral Agents - Anti-Herpetic Passed - 11/23/2023  1:09 PM      Passed - Valid encounter within last 12 months    Recent Outpatient Visits           1 week ago Localized osteoporosis without current pathological fracture   [facility], MD   5 months ago Allergic sinusitis   [facility] Cathryne JAYSON, MD   6 months ago Acute maxillary sinusitis, recurrence not specified   [facility] at [facility], Deanna C, MD

## 2023-11-24 DIAGNOSIS — I471 Supraventricular tachycardia, unspecified: Secondary | ICD-10-CM | POA: Diagnosis not present

## 2023-11-24 NOTE — Telephone Encounter (Signed)
 Patient has been made aware of providers instructions and recommendations. Patient verbalized understanding.

## 2023-12-02 ENCOUNTER — Other Ambulatory Visit: Payer: Self-pay | Admitting: Student

## 2023-12-02 DIAGNOSIS — A6004 Herpesviral vulvovaginitis: Secondary | ICD-10-CM

## 2023-12-02 MED ORDER — VALACYCLOVIR HCL 500 MG PO TABS: 500.0000 mg | ORAL_TABLET | Freq: Two times a day (BID) | ORAL | 0 refills | Status: AC

## 2023-12-02 NOTE — Telephone Encounter (Signed)
 Copied from CRM [id number]. Topic: Clinical - Medication Refill >> Dec 02, 2023  3:16 PM Turkey B wrote: Medication: valACYclovir  (VALTREX ) 500 MG tablet Patient requesting 60 more pills to make this a 90 day supply  because she takes these twice a day and doesn't want to run out  Has the patient contacted their pharmacy? No  This is the patient's preferred pharmacy:  [phone] [place] [place] - [address] [place] [address] Phone: [phone] Fax: [phone]  Is this the correct pharmacy for this prescription? yes  Has the prescription been filled recently? Yes 08/25 but for 30 days  Is the patient out of the medication? no  Has the patient been seen for an appointment in the last year OR does the patient have an upcoming appointment? yes  Can we respond through MyChart? yes  Agent: Please be advised that Rx refills may take up to 3 business days. We ask that you follow-up with your pharmacy.

## 2023-12-02 NOTE — Telephone Encounter (Signed)
 Refilling for 90 days.  Requested Prescriptions  Pending Prescriptions Disp Refills   valACYclovir  (VALTREX ) 500 MG tablet 180 tablet 0    Sig: Take 1 tablet (500 mg total) by mouth 2 (two) times daily.     Antimicrobials:  Antiviral Agents - Anti-Herpetic Passed - 12/02/2023  4:18 PM      Passed - Valid encounter within last 12 months    Recent Outpatient Visits           2 weeks ago Localized osteoporosis without current pathological fracture   [facility], MD   5 months ago Allergic sinusitis   [facility]ry[facility]YS[facility], MD   6 months ago Acute maxillary sinusitis, recurrence not specified   [facility], Deanna C, MD

## 2023-12-15 ENCOUNTER — Ambulatory Visit: Admitting: Emergency Medicine

## 2023-12-15 VITALS — Ht 60.0 in | Wt 135.0 lb

## 2023-12-15 DIAGNOSIS — Z0000 Encounter for general adult medical examination without abnormal findings: Secondary | ICD-10-CM | POA: Diagnosis not present

## 2023-12-15 NOTE — Progress Notes (Signed)
 Subjective:   Kirsten Moore is a 68 y.o. who presents for a Medicare Wellness preventive visit.  As a reminder, Annual Wellness Visits don't include a physical exam, and some assessments may be limited, especially if this visit is performed virtually. We may recommend an in-person follow-up visit with your provider if needed.  Visit Complete: Virtual I connected with  Kirsten Moore on 12/15/23 by a audio enabled telemedicine application and verified that I am speaking with the correct person using two identifiers.  Patient Location: Home  Provider Location: Home Office  I discussed the limitations of evaluation and management by telemedicine. The patient expressed understanding and agreed to proceed.  Vital Signs: Because this visit was a virtual/telehealth visit, some criteria may be missing or patient reported. Any vitals not documented were not able to be obtained and vitals that have been documented are patient reported.  VideoDeclined- This patient declined Librarian, academic. Therefore the visit was completed with audio only.  Persons Participating in Visit: Patient.  AWV Questionnaire: No: Patient Medicare AWV questionnaire was not completed prior to this visit.  Cardiac Risk Factors include: advanced age (>35men, >51 women);dyslipidemia;Other (see comment), Risk factor comments: CAD     Objective:    Today's Vitals   12/15/23 1005  Weight: 135 lb (61.2 kg)  Height: 5' (1.524 m)   Body mass index is 26.37 kg/m.     12/15/2023   10:17 AM 09/02/2022    2:24 PM 08/12/2022    8:51 AM 08/06/2021    8:56 AM 03/12/2015    8:47 AM 01/14/2015   11:40 AM 01/14/2015    9:25 AM  Advanced Directives  Does Patient Have a Medical Advance Directive? Yes Yes No Yes No  No  No   Type of Estate agent of [place];Living will Living will  Healthcare Power of [place];Living will     Does patient want to make changes to medical  advance directive? No - Patient declined        Copy of Healthcare Power of Attorney in Chart? No - copy requested   No - copy requested     Would patient like information on creating a medical advance directive?  No - Patient declined No - Patient declined  No - patient declined information   No - patient declined information      Data saved with a previous flowsheet row definition    Current Medications (verified) Outpatient Encounter Medications as of 12/15/2023  Medication Sig   Cholecalciferol (VITAMIN D3) 20 MCG (800 UNIT) TABS Take 1 tablet by mouth daily.   clopidogrel (PLAVIX) 75 MG tablet Take 75 mg by mouth daily.   estradiol  (ESTRACE ) 0.1 MG/GM vaginal cream Place 1 Applicatorful vaginally at bedtime. (Patient taking differently: Place vaginally at bedtime. Using qod)   losartan  (COZAAR ) 25 MG tablet Take 1 tablet (25 mg total) by mouth daily.   polyethylene glycol (MIRALAX / GLYCOLAX) 17 g packet Take 17 g by mouth daily as needed.   rosuvastatin  (CRESTOR ) 40 MG tablet Take 40 mg by mouth daily.   valACYclovir  (VALTREX ) 500 MG tablet Take 1 tablet (500 mg total) by mouth 2 (two) times daily.   No facility-administered encounter medications on file as of 12/15/2023.    Allergies (verified) Sulfa antibiotics, Zetia  [ezetimibe ], and Misc. sulfonamide containing compounds   History: Past Medical History:  Diagnosis Date   Hypertension    Past Surgical History:  Procedure Laterality Date   BREAST  BIOPSY Left 08/05/2020   stereo bx, x clip, calcs, path pending.   BUNIONECTOMY Left    CARDIAC ELECTROPHYSIOLOGY MAPPING AND ABLATION     CARDIAC ELECTROPHYSIOLOGY STUDY AND ABLATION     COLONOSCOPY  2011   cleared for 10 yrs- [facility]   VAGINAL HYSTERECTOMY     partial   Family History  Problem Relation Age of Onset   Hyperlipidemia Mother    Hypertension Mother    Breast cancer Mother 37   Macular degeneration Mother    Multiple myeloma Father    Macular degeneration  Sister    Breast cancer Sister 32   Valvular heart disease Sister    Breast cancer Other 29   Social History   Socioeconomic History   Marital status: Widowed    Spouse name: Not on file   Number of children: 2   Years of education: Not on file   Highest education level: Not on file  Occupational History   Occupation: retired  Tobacco Use   Smoking status: Former    Current packs/day: 0.00    Average packs/day: 1 pack/day for 25.0 years (25.0 ttl pk-yrs)    Types: Cigarettes    Start date: 08/31/1977    Quit date: 09/01/2002    Years since quitting: 21.3   Smokeless tobacco: Never  Vaping Use   Vaping status: Never Used  Substance and Sexual Activity   Alcohol use: No    Alcohol/week: 0.0 standard drinks of alcohol   Drug use: No   Sexual activity: Not Currently  Other Topics Concern   Not on file  Social History Narrative   Pt lives alone   Social Drivers of Health   Financial Resource Strain: Low Risk  (12/15/2023)   Overall Financial Resource Strain (CARDIA)    Difficulty of Paying Living Expenses: Not hard at all  Food Insecurity: No Food Insecurity (12/15/2023)   Hunger Vital Sign    Worried About Running Out of Food in the Last Year: Never true    Ran Out of Food in the Last Year: Never true  Transportation Needs: No Transportation Needs (12/15/2023)   PRAPARE - Administrator, Civil Service (Medical): No    Lack of Transportation (Non-Medical): No  Physical Activity: Inactive (12/15/2023)   Exercise Vital Sign    Days of Exercise per Week: 0 days    Minutes of Exercise per Session: 0 min  Stress: No Stress Concern Present (12/15/2023)   [employer] of Occupational Health - Occupational Stress Questionnaire    Feeling of Stress: Not at all  Social Connections: Socially Isolated (12/15/2023)   Social Connection and Isolation Panel    Frequency of Communication with Friends and Family: More than three times a week    Frequency of Social Gatherings  with Friends and Family: More than three times a week    Attends Religious Services: Never    Database administrator or Organizations: No    Attends Banker Meetings: Never    Marital Status: Widowed    Tobacco Counseling Counseling given: Not Answered    Clinical Intake:  Pre-visit preparation completed: Yes  Pain : No/denies pain     BMI - recorded: 26.37 Nutritional Status: BMI 25 -29 Overweight Nutritional Risks: None Diabetes: No  No results found for: HGBA1C   How often do you need to have someone help you when you read instructions, pamphlets, or other written materials from your doctor or pharmacy?: 1 - Never  Interpreter Needed?: No  Information entered by :: Vina Ned, CMA   Activities of Daily Living     12/15/2023   10:06 AM  In your present state of health, do you have any difficulty performing the following activities:  Hearing? 0  Vision? 0  Difficulty concentrating or making decisions? 0  Walking or climbing stairs? 0  Dressing or bathing? 0  Doing errands, shopping? 0  Preparing Food and eating ? N  Using the Toilet? N  In the past six months, have you accidently leaked urine? N  Do you have problems with loss of bowel control? N  Managing your Medications? N  Managing your Finances? N  Housekeeping or managing your Housekeeping? N    Patient Care Team: Lemon Raisin, MD as PCP - General (Internal Medicine) Jama Margery ORN, MD as Referring Physician (Cardiology) Enola Feliciano Hugger, MD as Consulting Physician (Ophthalmology)  I have updated your Care Teams any recent Medical Services you may have received from other providers in the past year.     Assessment:   This is a routine wellness examination for [facility]  Hearing/Vision screen Hearing Screening - Comments:: Denies hearing loss  Vision Screening - Comments:: Gets routine eye exams, [facility]   Goals Addressed             This Visit's  Progress    Patient Stated       Finish stain glass project        Depression Screen     12/15/2023   10:15 AM 11/16/2023    9:05 AM 06/17/2023    9:30 AM 05/24/2023   11:03 AM 02/05/2023   11:04 AM 12/29/2022    9:29 AM 10/19/2022    3:03 PM  PHQ 2/9 Scores  PHQ - 2 Score 0 0 0 0 0 0 0  PHQ- 9 Score 0 0  0 0 0 0    Fall Risk     12/15/2023   10:18 AM 11/16/2023    9:03 AM 06/17/2023    9:30 AM 05/24/2023   11:03 AM 02/05/2023   11:04 AM  Fall Risk   Falls in the past year? 0 0 0 0 0  Number falls in past yr: 0 0 0 0 0  Injury with Fall? 0 0 0 0 0  Risk for fall due to : No Fall Risks No Fall Risks No Fall Risks No Fall Risks No Fall Risks  Follow up Falls evaluation completed Falls evaluation completed Falls evaluation completed Falls evaluation completed Falls evaluation completed    MEDICARE RISK AT HOME:  Medicare Risk at Home Any stairs in or around the home?: Yes If so, are there any without handrails?: No Home free of loose throw rugs in walkways, pet beds, electrical cords, etc?: Yes Adequate lighting in your home to reduce risk of falls?: Yes Life alert?: No Use of a cane, walker or w/c?: No Grab bars in the bathroom?: Yes Shower chair or bench in shower?: No Elevated toilet seat or a handicapped toilet?: No  TIMED UP AND GO:  Was the test performed?  No  Cognitive Function: 6CIT completed        12/15/2023   10:19 AM 08/12/2022    8:56 AM  6CIT Screen  What Year? 0 points 0 points  What month? 0 points 0 points  What time? 0 points 0 points  Count back from 20 0 points 0 points  Months in reverse 0 points 0 points  Repeat phrase 0 points 0 points  Total Score 0 points 0 points    Immunizations Immunization History  Administered Date(s) Administered   Fluad Quad(high Dose 65+) 02/08/2019, 02/14/2021   Fluad Trivalent(High Dose 65+) 12/29/2022   Influenza,inj,Quad PF,6+ Mos 03/12/2015, 01/27/2017, 04/04/2018   Moderna Sars-Covid-2 Vaccination  05/04/2019, 06/01/2019, 01/30/2020, 12/12/2020   Tdap 03/12/2015    Screening Tests Health Maintenance  Topic Date Due   Hepatitis C Screening  Never done   Pneumococcal Vaccine: 50+ Years (1 of 2 - PCV) Never done   Zoster Vaccines- Shingrix (1 of 2) Never done   Colonoscopy  03/28/2020   COVID-19 Vaccine (5 - 2025-26 season) 11/29/2023   Mammogram  12/23/2023   Influenza Vaccine  06/27/2024 (Originally 10/29/2023)   Medicare Annual Wellness (AWV)  12/14/2024   DTaP/Tdap/Td (2 - Td or Tdap) 03/11/2025   DEXA SCAN  Completed   HPV VACCINES  Aged Out   Meningococcal B Vaccine  Aged Out    Health Maintenance Items Addressed: See Nurse Notes at the end of this note  Additional Screening:  Vision Screening: Recommended annual ophthalmology exams for early detection of glaucoma and other disorders of the eye. Is the patient up to date with their annual eye exam?  Yes  Who is the provider or what is the name of the office in which the patient attends annual eye exams? Dr. Feliciano Ober @ [facility]  Dental Screening: Recommended annual dental exams for proper oral hygiene  Community Resource Referral / Chronic Care Management: CRR required this visit?  No   CCM required this visit?  No   Plan:    I have personally reviewed and noted the following in the patient's chart:   Medical and social history Use of alcohol, tobacco or illicit drugs  Current medications and supplements including opioid prescriptions. Patient is not currently taking opioid prescriptions. Functional ability and status Nutritional status Physical activity Advanced directives List of other physicians Hospitalizations, surgeries, and ER visits in previous 12 months Vitals Screenings to include cognitive, depression, and falls Referrals and appointments  In addition, I have reviewed and discussed with patient certain preventive protocols, quality metrics, and best practice  recommendations. A written personalized care plan for preventive services as well as general preventive health recommendations were provided to patient.   Vina Ned, CMA   12/15/2023   After Visit Summary: (MyChart) Due to this being a telephonic visit, the after visit summary with patients personalized plan was offered to patient via MyChart   Notes:  Plans to get flu and pneumonia vaccines [facility] scheduled 12/29/23 Declined DEXA and colonosocpy Declined Covid and shingles vaccines

## 2023-12-15 NOTE — Patient Instructions (Signed)
 Kirsten Moore,  Thank you for taking the time for your Medicare Wellness Visit. I appreciate your continued commitment to your health goals. Please review the care plan we discussed, and feel free to reach out if I can assist you further.  Medicare recommends these wellness visits once per year to help you and your care team stay ahead of potential health issues. These visits are designed to focus on prevention, allowing your provider to concentrate on managing your acute and chronic conditions during your regular appointments.  Please note that Annual Wellness Visits do not include a physical exam. Some assessments may be limited, especially if the visit was conducted virtually. If needed, we may recommend a separate in-person follow-up with your provider.  Ongoing Care Seeing your primary care provider every 3 to 6 months helps us  monitor your health and provide consistent, personalized care.   Referrals If a referral was made during today's visit and you haven't received any updates within two weeks, please contact the referred provider directly to check on the status.  Recommended Screenings:  Get the flu and pneumonia vaccines at your convenience.   Health Maintenance  Topic Date Due   Hepatitis C Screening  Never done   Pneumococcal Vaccine for age over 6 (1 of 2 - PCV) Never done   Zoster (Shingles) Vaccine (1 of 2) Never done   Colon Cancer Screening  03/28/2020   COVID-19 Vaccine (5 - 2025-26 season) 11/29/2023   Breast Cancer Screening  12/23/2023   Flu Shot  06/27/2024*   Medicare Annual Wellness Visit  12/14/2024   DTaP/Tdap/Td vaccine (2 - Td or Tdap) 03/11/2025   DEXA scan (bone density measurement)  Completed   HPV Vaccine  Aged Out   Meningitis B Vaccine  Aged Out  *Topic was postponed. The date shown is not the original due date.       12/15/2023   10:17 AM  Advanced Directives  Does Patient Have a Medical Advance Directive? Yes  Type of Special educational needs teacher of [place];Living will  Does patient want to make changes to medical advance directive? No - Patient declined  Copy of Healthcare Power of Attorney in Chart? No - copy requested   Advance Care Planning is important because it: Ensures you receive medical care that aligns with your values, goals, and preferences. Provides guidance to your family and loved ones, reducing the emotional burden of decision-making during critical moments.  Vision: Annual vision screenings are recommended for early detection of glaucoma, cataracts, and diabetic retinopathy. These exams can also reveal signs of chronic conditions such as diabetes and high blood pressure.  Dental: Annual dental screenings help detect early signs of oral cancer, gum disease, and other conditions linked to overall health, including heart disease and diabetes.  Please see the attached documents for additional preventive care recommendations.   Fall Prevention in the Home, Adult Falls can cause injuries and affect people of all ages. There are many simple things that you can do to make your home safe and to help prevent falls. If you need it, ask for help making these changes. What actions can I take to prevent falls? General information Use good lighting in all rooms. Make sure to: Replace any light bulbs that burn out. Turn on lights if it is dark and use night-lights. Keep items that you use often in easy-to-reach places. Lower the shelves around your home if needed. Move furniture so that there are clear paths around it. Do not keep  throw rugs or other things on the floor that can make you trip. If any of your floors are uneven, fix them. Add color or contrast paint or tape to clearly mark and help you see: Grab bars or handrails. First and last steps of staircases. Where the edge of each step is. If you use a ladder or stepladder: Make sure that it is fully opened. Do not climb a closed ladder. Make sure the  sides of the ladder are locked in place. Have someone hold the ladder while you use it. Know where your pets are as you move through your home. What can I do in the bathroom?     Keep the floor dry. Clean up any water that is on the floor right away. Remove soap buildup in the bathtub or shower. Buildup makes bathtubs and showers slippery. Use non-skid mats or decals on the floor of the bathtub or shower. Attach bath mats securely with double-sided, non-slip rug tape. If you need to sit down while you are in the shower, use a non-slip stool. Install grab bars by the toilet and in the bathtub and shower. Do not use towel bars as grab bars. What can I do in the bedroom? Make sure that you have a light by your bed that is easy to reach. Do not use any sheets or blankets on your bed that hang to the floor. Have a firm bench or chair with side arms that you can use for support when you get dressed. What can I do in the kitchen? Clean up any spills right away. If you need to reach something above you, use a sturdy step stool that has a grab bar. Keep electrical cables out of the way. Do not use floor polish or wax that makes floors slippery. What can I do with my stairs? Do not leave anything on the stairs. Make sure that you have a light switch at the top and the bottom of the stairs. Have them installed if you do not have them. Make sure that there are handrails on both sides of the stairs. Fix handrails that are broken or loose. Make sure that handrails are as long as the staircases. Install non-slip stair treads on all stairs in your home if they do not have carpet. Avoid having throw rugs at the top or bottom of stairs, or secure the rugs with carpet tape to prevent them from moving. Choose a carpet design that does not hide the edge of steps on the stairs. Make sure that carpet is firmly attached to the stairs. Fix any carpet that is loose or worn. What can I do on the outside of my  home? Use bright outdoor lighting. Repair the edges of walkways and driveways and fix any cracks. Clear paths of anything that can make you trip, such as tools or rocks. Add color or contrast paint or tape to clearly mark and help you see high doorway thresholds. Trim any bushes or trees on the main path into your home. Check that handrails are securely fastened and in good repair. Both sides of all steps should have handrails. Install guardrails along the edges of any raised decks or porches. Have leaves, snow, and ice cleared regularly. Use sand, salt, or ice melt on walkways during winter months if you live where there is ice and snow. In the garage, clean up any spills right away, including grease or oil spills. What other actions can I take? Review your medicines with your health  care provider. Some medicines can make you confused or feel dizzy. This can increase your chance of falling. Wear closed-toe shoes that fit well and support your feet. Wear shoes that have rubber soles and low heels. Use a cane, walker, scooter, or crutches that help you move around if needed. Talk with your provider about other ways that you can decrease your risk of falls. This may include seeing a physical therapist to learn to do exercises to improve movement and strength. Where to find more information Centers for Disease Control and Prevention, STEADI: TonerPromos.no [employer] on Aging: BaseRingTones.pl National Institute on Aging: BaseRingTones.pl Contact a health care provider if: You are afraid of falling at home. You feel weak, drowsy, or dizzy at home. You fall at home. Get help right away if you: Lose consciousness or have trouble moving after a fall. Have a fall that causes a head injury. These symptoms may be an emergency. Get help right away. Call 911. Do not wait to see if the symptoms will go away. Do not drive yourself to the hospital. This information is not intended to replace advice given to you  by your health care provider. Make sure you discuss any questions you have with your health care provider. Document Revised: 11/17/2021 Document Reviewed: 11/17/2021 Elsevier Patient Education  2024 [employer].

## 2023-12-29 ENCOUNTER — Ambulatory Visit
Admission: RE | Admit: 2023-12-29 | Discharge: 2023-12-29 | Disposition: A | Discharge status: Home / Self Care | Source: Ambulatory Visit | Attending: Student | Admitting: Student

## 2023-12-29 DIAGNOSIS — Z1231 Encounter for screening mammogram for malignant neoplasm of breast: Secondary | ICD-10-CM | POA: Diagnosis not present

## 2024-01-24 ENCOUNTER — Ambulatory Visit (INDEPENDENT_AMBULATORY_CARE_PROVIDER_SITE_OTHER)

## 2024-01-24 DIAGNOSIS — Z23 Encounter for immunization: Secondary | ICD-10-CM | POA: Diagnosis not present

## 2024-01-24 NOTE — Progress Notes (Addendum)
 Kirsten Moore                                          MRN: [id number]   01/24/2024   The [facility] Quality Team Specialist reviewed this patient medical record for the purposes of chart review for care gap closure. The following were reviewed: chart review for care gap closure-colorectal cancer screening.  03/02/2024- no col screening found  04/17/2024- cannot close COL 2025    [facility]I Quality Team

## 2024-01-24 NOTE — Progress Notes (Signed)
 Patient presents today for her annual flu vaccine. She is doing well today. Flu vaccine administered into left deltoid. Patient tolderated well.  JM

## 2024-02-01 DIAGNOSIS — R9431 Abnormal electrocardiogram [ECG] [EKG]: Secondary | ICD-10-CM | POA: Diagnosis not present

## 2024-02-01 DIAGNOSIS — E785 Hyperlipidemia, unspecified: Secondary | ICD-10-CM | POA: Diagnosis not present

## 2024-02-01 DIAGNOSIS — I2111 ST elevation (STEMI) myocardial infarction involving right coronary artery: Secondary | ICD-10-CM | POA: Diagnosis not present

## 2024-02-01 DIAGNOSIS — I471 Supraventricular tachycardia, unspecified: Secondary | ICD-10-CM | POA: Diagnosis not present

## 2024-02-01 DIAGNOSIS — Z955 Presence of coronary angioplasty implant and graft: Secondary | ICD-10-CM | POA: Diagnosis not present

## 2024-02-01 DIAGNOSIS — I10 Essential (primary) hypertension: Secondary | ICD-10-CM | POA: Diagnosis not present

## 2024-02-15 ENCOUNTER — Other Ambulatory Visit: Payer: Self-pay

## 2024-02-15 DIAGNOSIS — I10 Essential (primary) hypertension: Secondary | ICD-10-CM

## 2024-02-15 MED ORDER — LOSARTAN POTASSIUM 25 MG PO TABS: 25.0000 mg | ORAL_TABLET | Freq: Every day | ORAL | 1 refills | Status: AC

## 2024-03-14 ENCOUNTER — Other Ambulatory Visit: Payer: Self-pay | Admitting: Student

## 2024-03-14 DIAGNOSIS — A6004 Herpesviral vulvovaginitis: Secondary | ICD-10-CM

## 2024-03-17 ENCOUNTER — Other Ambulatory Visit: Payer: Self-pay

## 2024-03-17 ENCOUNTER — Telehealth: Payer: Self-pay

## 2024-03-17 NOTE — Telephone Encounter (Signed)
 Refill sent ? ?KP ?

## 2024-03-17 NOTE — Telephone Encounter (Signed)
 Copied from CRM #[id number]. Topic: Clinical - Prescription Issue >> Mar 17, 2024  8:22 AM Carlyon D wrote: Reason for CRM: pt is calling on regards to meds  valACYclovir  (VALTREX ) 500 MG tablet Pt states pharmacy requested refill over 3 days ago and pt still has no script, pt is wondering what the hold up is? Made pt aware script is pending. Pt would like to pick up her meds please reach out to pt to further discuss

## 2024-05-18 ENCOUNTER — Encounter: Admitting: Student

## 2024-12-20 ENCOUNTER — Ambulatory Visit
